# Patient Record
Sex: Male | Born: 1950
Health system: Southern US, Community
[De-identification: ages and names within clinical notes are randomized; demographics above are authoritative.]

## PROBLEM LIST (undated history)

## (undated) DIAGNOSIS — R945 Abnormal results of liver function studies: Secondary | ICD-10-CM

## (undated) DIAGNOSIS — C4491 Basal cell carcinoma of skin, unspecified: Secondary | ICD-10-CM

## (undated) DIAGNOSIS — R7989 Other specified abnormal findings of blood chemistry: Secondary | ICD-10-CM

## (undated) DIAGNOSIS — K219 Gastro-esophageal reflux disease without esophagitis: Secondary | ICD-10-CM

## (undated) DIAGNOSIS — Z8052 Family history of malignant neoplasm of bladder: Secondary | ICD-10-CM

## (undated) DIAGNOSIS — N529 Male erectile dysfunction, unspecified: Secondary | ICD-10-CM

## (undated) DIAGNOSIS — Z8042 Family history of malignant neoplasm of prostate: Secondary | ICD-10-CM

## (undated) DIAGNOSIS — I10 Essential (primary) hypertension: Secondary | ICD-10-CM

## (undated) HISTORY — DX: Family history of malignant neoplasm of prostate: Z80.42

## (undated) HISTORY — DX: Male erectile dysfunction, unspecified: N52.9

## (undated) HISTORY — DX: Family history of malignant neoplasm of bladder: Z80.52

## (undated) HISTORY — DX: Other specified abnormal findings of blood chemistry: R79.89

## (undated) HISTORY — DX: Abnormal results of liver function studies: R94.5

## (undated) HISTORY — DX: Gastro-esophageal reflux disease without esophagitis: K21.9

## (undated) HISTORY — PX: WRIST GANGLION EXCISION: SUR520

## (undated) HISTORY — DX: Basal cell carcinoma of skin, unspecified: C44.91

## (undated) HISTORY — DX: Essential (primary) hypertension: I10

---

## 2002-09-29 ENCOUNTER — Emergency Department (HOSPITAL_COMMUNITY): Admission: EM | Admit: 2002-09-29 | Discharge: 2002-09-29 | Payer: Self-pay | Admitting: Emergency Medicine

## 2002-09-29 ENCOUNTER — Encounter: Payer: Self-pay | Admitting: Emergency Medicine

## 2004-05-30 ENCOUNTER — Ambulatory Visit: Payer: Self-pay | Admitting: Internal Medicine

## 2004-05-31 ENCOUNTER — Ambulatory Visit: Payer: Self-pay | Admitting: Internal Medicine

## 2004-06-08 ENCOUNTER — Ambulatory Visit: Payer: Self-pay | Admitting: Internal Medicine

## 2005-01-05 ENCOUNTER — Ambulatory Visit: Payer: Self-pay | Admitting: Gastroenterology

## 2005-01-10 LAB — HM COLONOSCOPY

## 2005-01-20 ENCOUNTER — Ambulatory Visit: Payer: Self-pay | Admitting: Gastroenterology

## 2005-06-08 ENCOUNTER — Ambulatory Visit: Payer: Self-pay | Admitting: Internal Medicine

## 2006-08-31 ENCOUNTER — Ambulatory Visit: Payer: Self-pay | Admitting: Internal Medicine

## 2006-08-31 LAB — CONVERTED CEMR LAB
ALT: 34 units/L (ref 0–40)
AST: 33 units/L (ref 0–37)
Alkaline Phosphatase: 65 units/L (ref 39–117)
BUN: 14 mg/dL (ref 6–23)
Basophils Relative: 1 % (ref 0.0–1.0)
Bilirubin, Direct: 0.2 mg/dL (ref 0.0–0.3)
CO2: 30 meq/L (ref 19–32)
Calcium: 9.4 mg/dL (ref 8.4–10.5)
Chloride: 106 meq/L (ref 96–112)
Eosinophils Absolute: 0.3 10*3/uL (ref 0.0–0.6)
Eosinophils Relative: 4.7 % (ref 0.0–5.0)
GFR calc non Af Amer: 67 mL/min
Glucose, Bld: 81 mg/dL (ref 70–99)
Platelets: 165 10*3/uL (ref 150–400)
RBC: 4.92 M/uL (ref 4.22–5.81)
Total CHOL/HDL Ratio: 5.4
Triglycerides: 141 mg/dL (ref 0–149)
VLDL: 28 mg/dL (ref 0–40)
WBC: 5.5 10*3/uL (ref 4.5–10.5)

## 2006-09-07 ENCOUNTER — Ambulatory Visit: Payer: Self-pay | Admitting: Internal Medicine

## 2007-05-14 ENCOUNTER — Telehealth: Payer: Self-pay | Admitting: Internal Medicine

## 2007-05-16 ENCOUNTER — Ambulatory Visit: Payer: Self-pay | Admitting: Internal Medicine

## 2007-05-16 DIAGNOSIS — I10 Essential (primary) hypertension: Secondary | ICD-10-CM

## 2007-05-16 DIAGNOSIS — M6740 Ganglion, unspecified site: Secondary | ICD-10-CM | POA: Insufficient documentation

## 2007-11-18 ENCOUNTER — Encounter: Payer: Self-pay | Admitting: Internal Medicine

## 2007-12-31 ENCOUNTER — Telehealth: Payer: Self-pay | Admitting: Internal Medicine

## 2008-01-22 ENCOUNTER — Encounter: Payer: Self-pay | Admitting: Internal Medicine

## 2008-04-09 ENCOUNTER — Ambulatory Visit: Payer: Self-pay | Admitting: Internal Medicine

## 2008-04-14 ENCOUNTER — Telehealth: Payer: Self-pay | Admitting: Internal Medicine

## 2008-04-14 ENCOUNTER — Encounter: Payer: Self-pay | Admitting: Internal Medicine

## 2008-04-27 ENCOUNTER — Telehealth: Payer: Self-pay | Admitting: Internal Medicine

## 2008-05-04 ENCOUNTER — Ambulatory Visit: Payer: Self-pay | Admitting: Internal Medicine

## 2008-05-04 LAB — CONVERTED CEMR LAB
ALT: 37 units/L (ref 0–53)
AST: 42 units/L — ABNORMAL HIGH (ref 0–37)
Basophils Absolute: 0 10*3/uL (ref 0.0–0.1)
Bilirubin Urine: NEGATIVE
Bilirubin, Direct: 0.2 mg/dL (ref 0.0–0.3)
Blood in Urine, dipstick: NEGATIVE
CO2: 28 meq/L (ref 19–32)
Chloride: 100 meq/L (ref 96–112)
Cholesterol: 149 mg/dL (ref 0–200)
Eosinophils Absolute: 0.2 10*3/uL (ref 0.0–0.7)
GFR calc non Af Amer: 67 mL/min
HDL: 34.2 mg/dL — ABNORMAL LOW (ref >39.0)
LDL Cholesterol: 96 mg/dL (ref 0–99)
Lymphocytes Relative: 37.3 % (ref 12.0–46.0)
MCHC: 34.9 g/dL (ref 30.0–36.0)
MCV: 96.1 fL (ref 78.0–100.0)
Neutrophils Relative %: 46.2 % (ref 43.0–77.0)
PSA: 2.35 ng/mL (ref 0.10–4.00)
Platelets: 163 10*3/uL (ref 150–400)
RBC: 4.46 M/uL (ref 4.22–5.81)
RDW: 12.7 % (ref 11.5–14.6)
Sodium: 135 meq/L (ref 135–145)
TSH: 1.11 microintl units/mL (ref 0.35–5.50)
Total Bilirubin: 1.1 mg/dL (ref 0.3–1.2)
Triglycerides: 95 mg/dL (ref 0–149)
Urobilinogen, UA: 0.2
VLDL: 19 mg/dL (ref 0–40)
pH: 7

## 2008-05-07 ENCOUNTER — Ambulatory Visit: Payer: Self-pay | Admitting: Internal Medicine

## 2008-05-07 DIAGNOSIS — F528 Other sexual dysfunction not due to a substance or known physiological condition: Secondary | ICD-10-CM

## 2008-05-25 ENCOUNTER — Ambulatory Visit: Payer: Self-pay | Admitting: Internal Medicine

## 2009-05-21 ENCOUNTER — Ambulatory Visit: Payer: Self-pay | Admitting: Internal Medicine

## 2009-05-21 DIAGNOSIS — J069 Acute upper respiratory infection, unspecified: Secondary | ICD-10-CM | POA: Insufficient documentation

## 2009-07-15 ENCOUNTER — Ambulatory Visit: Payer: Self-pay | Admitting: Internal Medicine

## 2009-07-15 LAB — CONVERTED CEMR LAB
ALT: 36 units/L (ref 0–53)
Albumin: 4.6 g/dL (ref 3.5–5.2)
BUN: 12 mg/dL (ref 6–23)
Basophils Relative: 0.8 % (ref 0.0–3.0)
Bilirubin Urine: NEGATIVE
CO2: 28 meq/L (ref 19–32)
Chloride: 100 meq/L (ref 96–112)
Creatinine, Ser: 1.3 mg/dL (ref 0.4–1.5)
Eosinophils Relative: 2.8 % (ref 0.0–5.0)
HCT: 44.4 % (ref 39.0–52.0)
HDL: 40.9 mg/dL (ref >39.00)
Ketones, ur: NEGATIVE mg/dL
Leukocytes, UA: NEGATIVE
Lymphs Abs: 1.9 10*3/uL (ref 0.7–4.0)
MCV: 96.2 fL (ref 78.0–100.0)
Monocytes Absolute: 0.6 10*3/uL (ref 0.1–1.0)
Neutro Abs: 2.6 10*3/uL (ref 1.4–7.7)
PSA: 3.09 ng/mL (ref 0.10–4.00)
Platelets: 162 10*3/uL (ref 150.0–400.0)
Potassium: 4.4 meq/L (ref 3.5–5.1)
RBC: 4.62 M/uL (ref 4.22–5.81)
TSH: 1.48 microintl units/mL (ref 0.35–5.50)
Total Protein: 7.6 g/dL (ref 6.0–8.3)
Urobilinogen, UA: 0.2 (ref 0.0–1.0)
WBC: 5.2 10*3/uL (ref 4.5–10.5)

## 2009-07-22 ENCOUNTER — Ambulatory Visit: Payer: Self-pay | Admitting: Internal Medicine

## 2009-12-10 ENCOUNTER — Encounter (INDEPENDENT_AMBULATORY_CARE_PROVIDER_SITE_OTHER): Payer: Self-pay | Admitting: *Deleted

## 2010-03-14 ENCOUNTER — Telehealth: Payer: Self-pay | Admitting: Internal Medicine

## 2010-05-31 NOTE — Assessment & Plan Note (Signed)
Summary: CONGESTION, URI? // RS   Vital Signs:  Patient profile:   60 year old male Weight:      246 pounds Temp:     99.1 degrees F oral BP sitting:   148 / 96  (left arm) Cuff size:   regular  Vitals Entered By: Raechel Ache, RN (May 21, 2009 10:59 AM) CC: C/o headache, runny nose, sneezing and cough all week.   CC:  C/o headache, runny nose, and sneezing and cough all week.Marland Kitchen  History of Present Illness: 60 year old patient, who presents with a 4 to 5 day history of head and chest congestion and nonproductive cough.  He has been using OTC medications with only marginal benefit.  He has a history of treated hypertension.  His main symptoms cough.  He denies any fever, chills, chest pain, shortness of breath, or purulent sputum production  Allergies: No Known Drug Allergies  Past History:  Past Medical History: Reviewed history from 05/16/2007 and no changes required. Hypertension ED history of elevated   Liver function studies (hepatic steatosis) history retinal vein occlusion of all the right eye  Review of Systems       The patient complains of anorexia, hoarseness, and prolonged cough.  The patient denies fever, weight loss, weight gain, vision loss, decreased hearing, chest pain, syncope, dyspnea on exertion, peripheral edema, headaches, hemoptysis, abdominal pain, melena, hematochezia, severe indigestion/heartburn, hematuria, incontinence, genital sores, muscle weakness, suspicious skin lesions, transient blindness, difficulty walking, depression, unusual weight change, abnormal bleeding, enlarged lymph nodes, angioedema, breast masses, and testicular masses.    Physical Exam  General:  overweight-appearing.  150/90 Head:  Normocephalic and atraumatic without obvious abnormalities. No apparent alopecia or balding. Eyes:  No corneal or conjunctival inflammation noted. EOMI. Perrla. Funduscopic exam benign, without hemorrhages, exudates or papilledema. Vision grossly  normal. Ears:  External ear exam shows no significant lesions or deformities.  Otoscopic examination reveals clear canals, tympanic membranes are intact bilaterally without bulging, retraction, inflammation or discharge. Hearing is grossly normal bilaterally. Mouth:  pharyngeal erythema.   Neck:  No deformities, masses, or tenderness noted. Lungs:  Normal respiratory effort, chest expands symmetrically. Lungs are clear to auscultation, no crackles or wheezes. Heart:  Normal rate and regular rhythm. S1 and S2 normal without gallop, murmur, click, rub or other extra sounds.   Impression & Recommendations:  Problem # 1:  URI (ICD-465.9)  His updated medication list for this problem includes:    Aspirin 81 Mg Tbec (Aspirin) .Marland Kitchen... Take 1 tablet by mouth every morning    Hydrocodone-homatropine 5-1.5 Mg/60ml Syrp (Hydrocodone-homatropine) .Marland Kitchen... 1 teaspoon every 6 hours as needed for cough  Problem # 2:  HYPERTENSION (ICD-401.9)  His updated medication list for this problem includes:    Lisinopril 20 Mg Tabs (Lisinopril) ..... One daily  Complete Medication List: 1)  Aspirin 81 Mg Tbec (Aspirin) .... Take 1 tablet by mouth every morning 2)  Xalatan 0.005 % Soln (Latanoprost) .... Instill 1 drop into both eyes every night 3)  Zolpidem Tartrate 10 Mg Tabs (Zolpidem tartrate) .... One at bedtime as needed 4)  Trazodone Hcl 100 Mg Tabs (Trazodone hcl) .Marland Kitchen.. 1 at bedtime for sleep 5)  Viagra 100 Mg Tabs (Sildenafil citrate) .... Uad 6)  Lisinopril 20 Mg Tabs (Lisinopril) .... One daily 7)  Hydrocodone-homatropine 5-1.5 Mg/14ml Syrp (Hydrocodone-homatropine) .Marland Kitchen.. 1 teaspoon every 6 hours as needed for cough  Patient Instructions: 1)  Get plenty of rest, drink lots of clear liquids, and use Tylenol or  Ibuprofen for fever and comfort. Return in 7-10 days if you're not better:sooner if you're feeling worse. 2)  Limit your Sodium (Salt) to less than 2 grams a day(slightly less than 1/2 a teaspoon) to  prevent fluid retention, swelling, or worsening of symptoms. 3)  It is important that you exercise regularly at least 20 minutes 5 times a week. If you develop chest pain, have severe difficulty breathing, or feel very tired , stop exercising immediately and seek medical attention. 4)  Check your Blood Pressure regularly. If it is above: 140/90  you should make an appointment. 5)  Annual exam 4-6 months Prescriptions: HYDROCODONE-HOMATROPINE 5-1.5 MG/5ML SYRP (HYDROCODONE-HOMATROPINE) 1 teaspoon every 6 hours as needed for cough  #6 oz x 1   Entered and Authorized by:   Gordy Savers  MD   Signed by:   Gordy Savers  MD on 05/21/2009   Method used:   Print then Give to Patient   RxID:   [id number] LISINOPRIL 20 MG TABS (LISINOPRIL) one daily  #90 x 5   Entered and Authorized by:   Gordy Savers  MD   Signed by:   Gordy Savers  MD on 05/21/2009   Method used:   Print then Give to Patient   RxID:   [id number] VIAGRA 100 MG TABS (SILDENAFIL CITRATE) UAD  #12 x 12   Entered and Authorized by:   Gordy Savers  MD   Signed by:   Gordy Savers  MD on 05/21/2009   Method used:   Print then Give to Patient   RxID:   [id number] TRAZODONE HCL 100 MG TABS (TRAZODONE HCL) 1 at bedtime for sleep  #50 x 6   Entered and Authorized by:   Gordy Savers  MD   Signed by:   Gordy Savers  MD on 05/21/2009   Method used:   Print then Give to Patient   RxID:   [id number] ZOLPIDEM TARTRATE 10 MG  TABS (ZOLPIDEM TARTRATE) one at bedtime as needed  #50 x 3   Entered and Authorized by:   Gordy Savers  MD   Signed by:   Gordy Savers  MD on 05/21/2009   Method used:   Print then Give to Patient   RxID:   [id number]

## 2010-05-31 NOTE — Progress Notes (Signed)
Summary: REQ FOR DOSAGE CHANGE  Phone Note Call from Patient   Caller: Patient   [phone] Summary of Call: Pt called to state that he would like to have his Rx for Cialis changed to a daily dosage instead as needed dosage????   [facility], [place].  Initial call taken by: Debbra Riding,  March 14, 2010 11:37 AM  Follow-up for Phone Call        cialis 5 mg  #30 one daily  RF 6 Follow-up by: Gordy Savers  MD,  March 14, 2010 12:31 PM  Additional Follow-up for Phone Call Additional follow up Details #1::        change to med list , faxed new rx - pt aware. KIK Additional Follow-up by: Duard Brady LPN,  March 14, 2010 2:22 PM    New/Updated Medications: CIALIS 5 MG TABS (TADALAFIL) qd Prescriptions: CIALIS 5 MG TABS (TADALAFIL) qd  #30 x 6   Entered by:   Duard Brady LPN   Authorized by:   Gordy Savers  MD   Signed by:   Duard Brady LPN on 78/29/5621   Method used:   Faxed to ...       [facility]. [phone]* (retail)       [phone] [address].       [place], [place]  [address]       Ph: [phone]       Fax: [phone]   RxID:   [id number]

## 2010-05-31 NOTE — Assessment & Plan Note (Signed)
Summary: CPX/NJR   Vital Signs:  Patient profile:   60 year old male Height:      70.75 inches Weight:      237 pounds BMI:     33.41 Temp:     98.6 degrees F oral BP sitting:   112 / 74  (right arm) Cuff size:   large CC: cpx - doing well . labs done Is Patient Diabetic? No   CC:  cpx - doing well . labs done.  History of Present Illness: 60 year old gentleman, who is seen today for a wellness exam.  He has a history of hypertension, mild ED, and has done quite well.  No concerns or complaints today.  He does have a brother who has coronary artery disease.  He was a heavy smoker.  He is up-to-date on his colonoscopies  Preventive Screening-Counseling & Management  Alcohol-Tobacco     Smoking Status: never  Allergies (verified): No Known Drug Allergies  Past History:  Past Medical History: Reviewed history from 05/16/2007 and no changes required. Hypertension ED history of elevated   Liver function studies (hepatic steatosis) history retinal vein occlusion of all the right eye  Past Surgical History: Reviewed history from 05/16/2007 and no changes required. colonoscopy September 2006  Family History: Reviewed history from 05/16/2007 and no changes required. father died age 74 MRI mother died age 64.  History hypertension  Four brothers one brother, coronary artery disease, status post stenting positive for  hypertension one brother died of accidental  death  No sisters  Social History: Reviewed history from 05/16/2007 and no changes required. 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    Physical Exam  General:  overweight-appearing.  low-normal blood pressureoverweight-appearing.   Head:  Normocephalic and atraumatic without obvious abnormalities. No apparent alopecia or balding. Eyes:  No corneal or conjunctival inflammation noted. EOMI. Perrla. Funduscopic exam benign, without hemorrhages, exudates or papilledema. Vision grossly normal. Ears:  External ear exam shows no significant lesions or deformities.  Otoscopic examination reveals clear canals, tympanic membranes are intact bilaterally without bulging, retraction, inflammation or discharge. Hearing is grossly normal bilaterally. Mouth:  Oral mucosa and oropharynx without lesions or exudates.  Teeth in good repair. Neck:  No deformities, masses, or tenderness noted. Chest Wall:  No deformities, masses, tenderness or gynecomastia noted. Breasts:  No masses or gynecomastia noted Lungs:  Normal respiratory effort, chest expands symmetrically. Lungs are clear to auscultation, no crackles or wheezes. Heart:  Normal rate and regular rhythm. S1 and S2 normal without gallop, murmur, click, rub or other extra sounds. Abdomen:  Bowel sounds positive,abdomen soft and non-tender without masses, organomegaly or hernias noted. Rectal:  No external abnormalities noted. Normal sphincter tone. No rectal masses or tenderness. Genitalia:  Testes bilaterally descended without nodularity, tenderness or masses. No scrotal masses or lesions. No penis lesions or urethral discharge. Prostate:  2+ enlarged.  2+ enlarged.   Msk:  No deformity or scoliosis noted of thoracic or lumbar spine.   Pulses:  R and L carotid,radial,femoral,dorsalis pedis and posterior tibial pulses are full and equal bilaterally Extremities:  No clubbing, cyanosis, edema, or  deformity noted with  normal full range of motion of all joints.   Neurologic:  No cranial nerve deficits noted. Station and gait are normal. Plantar reflexes are down-going bilaterally. DTRs are symmetrical throughout. Sensory, motor and coordinative functions appear intact. Skin:  Intact without suspicious lesions or rashes Cervical Nodes:  No lymphadenopathy noted Axillary Nodes:  No palpable lymphadenopathy Inguinal Nodes:  No significant adenopathy Psych:  Cognition and judgment appear intact. Alert and cooperative with normal attention span and concentration. No apparent delusions, illusions, hallucinations   Impression & Recommendations:  Problem # 1:  Preventive Health Care (ICD-V70.0)  Complete Medication List: 1)  Aspirin 81 Mg Tbec (Aspirin) .... Take 1 tablet by mouth every morning 2)  Xalatan 0.005 % Soln (Latanoprost) .... Instill 1 drop into both eyes every night 3)  Zolpidem Tartrate 10 Mg Tabs (Zolpidem tartrate) .... One at bedtime as needed 4)  Trazodone Hcl 100 Mg Tabs (Trazodone hcl) .Marland Kitchen.. 1 at bedtime for sleep 5)  Lisinopril 20 Mg Tabs (Lisinopril) .... One daily 6)  Hydrocodone-homatropine 5-1.5 Mg/50ml Syrp (Hydrocodone-homatropine) .Marland Kitchen.. 1 teaspoon every 6 hours as needed for cough 7)  Cialis 20 Mg Tabs (Tadalafil) .... One daily, as needed  Patient Instructions: 1)  Please schedule a follow-up appointment in 1 year. 2)  Limit your Sodium (Salt). 3)  It is important that you exercise regularly at least 20 minutes 5 times a week. If you develop chest pain, have severe difficulty breathing, or feel very tired , stop exercising immediately and seek medical attention. 4)  You need to lose weight. Consider a lower calorie diet and regular exercise.  5)  Check your Blood Pressure regularly. If it is above: 150/90 you should make an appointment. Prescriptions: CIALIS 20 MG TABS (TADALAFIL) one daily, as needed  #3 x 12   Entered and Authorized by:   Gordy Savers  MD   Signed by:    Gordy Savers  MD on 07/22/2009   Method used:   Print then Give to Patient   RxID:   [id number] CIALIS 20 MG TABS (TADALAFIL) one daily, as needed  #12 x 12   Entered and Authorized by:   Gordy Savers  MD   Signed by:   Gordy Savers  MD on 07/22/2009   Method used:   Print then Give to Patient   RxID:   [id number] LISINOPRIL 20 MG TABS (LISINOPRIL) one daily  #90 x 5   Entered and Authorized by:   Gordy Savers  MD   Signed by:   Gordy Savers  MD on 07/22/2009   Method used:   Print then Give to Patient   RxID:   [id number] TRAZODONE HCL 100 MG TABS (TRAZODONE HCL) 1 at bedtime for sleep  #50 x 6   Entered and Authorized by:   Gordy Savers  MD   Signed by:   Gordy Savers  MD on 07/22/2009   Method used:   Print then Give to Patient   RxID:   [id number] ZOLPIDEM TARTRATE 10 MG  TABS (ZOLPIDEM TARTRATE) one at bedtime as needed  #50 x 3   Entered and Authorized by:   Gordy Savers  MD   Signed by:   Gordy Savers  MD on 07/22/2009   Method used:   Print then Give to Patient   RxID:   [id number]

## 2010-05-31 NOTE — Letter (Signed)
Summary: Colonoscopy Letter  [facility]  [address] [place], [place] [address]   Phone: [phone]  Fax: [phone]      December 10, 2009 MRN: [id number]   [facility] [address] [place], [place]  [address]   Dear Dillon [name],   According to your medical record, it is time for you to schedule a Colonoscopy. The American Cancer Society recommends this procedure as a method to detect early colon cancer. Patients with a family history of colon cancer, or a personal history of colon polyps or inflammatory bowel disease are at increased risk.  This letter has beeen generated based on the recommendations made at the time of your procedure. If you feel that in your particular situation this may no longer apply, please contact our office.  Please call our office at [phone] to schedule this appointment or to update your records at your earliest convenience.  Thank you for cooperating with [place] to provide you with the very best care possible.   Sincerely,  Judie Petit T. Russella Dar, M.D.  [facility] [phone]

## 2010-06-03 ENCOUNTER — Telehealth: Payer: Self-pay | Admitting: Internal Medicine

## 2010-06-03 NOTE — Telephone Encounter (Signed)
Pt called and need a script written for Viagra. Pt says that Cialis  Isn't working properly. Pls call on Monday 06/06/10.

## 2010-06-03 NOTE — Telephone Encounter (Signed)
Please advise 

## 2010-06-06 MED ORDER — SILDENAFIL CITRATE 100 MG PO TABS: 100.0000 mg | ORAL_TABLET | ORAL | Status: DC | PRN

## 2010-06-06 NOTE — Telephone Encounter (Signed)
ok 

## 2010-06-06 NOTE — Telephone Encounter (Signed)
Attempt to call - ans mach - LMTCB if questions - rx ready for pick up. KIK

## 2010-07-18 ENCOUNTER — Encounter (INDEPENDENT_AMBULATORY_CARE_PROVIDER_SITE_OTHER): Payer: Self-pay | Admitting: *Deleted

## 2010-07-18 ENCOUNTER — Other Ambulatory Visit: Payer: BC Managed Care – PPO

## 2010-07-18 ENCOUNTER — Other Ambulatory Visit: Payer: Self-pay | Admitting: Internal Medicine

## 2010-07-18 DIAGNOSIS — Z0000 Encounter for general adult medical examination without abnormal findings: Secondary | ICD-10-CM

## 2010-07-18 LAB — CBC WITH DIFFERENTIAL/PLATELET
Basophils Absolute: 0 10*3/uL (ref 0.0–0.1)
Eosinophils Absolute: 0.2 10*3/uL (ref 0.0–0.7)
Hemoglobin: 14.5 g/dL (ref 13.0–17.0)
Lymphocytes Relative: 41.2 % (ref 12.0–46.0)
MCHC: 34.5 g/dL (ref 30.0–36.0)
MCV: 92.9 fl (ref 78.0–100.0)
Monocytes Absolute: 0.6 10*3/uL (ref 0.1–1.0)
Neutro Abs: 2.1 10*3/uL (ref 1.4–7.7)
Neutrophils Relative %: 42.4 % — ABNORMAL LOW (ref 43.0–77.0)
RDW: 13.5 % (ref 11.5–14.6)

## 2010-07-18 LAB — HEPATIC FUNCTION PANEL
ALT: 26 U/L (ref 0–53)
AST: 27 U/L (ref 0–37)
Albumin: 4.2 g/dL (ref 3.5–5.2)
Alkaline Phosphatase: 58 U/L (ref 39–117)
Bilirubin, Direct: 0.2 mg/dL (ref 0.0–0.3)
Total Protein: 7 g/dL (ref 6.0–8.3)

## 2010-07-18 LAB — LIPID PANEL: HDL: 31.5 mg/dL — ABNORMAL LOW (ref >39.00)

## 2010-07-18 LAB — BASIC METABOLIC PANEL
CO2: 27 mEq/L (ref 19–32)
Calcium: 9.3 mg/dL (ref 8.4–10.5)
Chloride: 103 mEq/L (ref 96–112)
Glucose, Bld: 99 mg/dL (ref 70–99)
Sodium: 136 mEq/L (ref 135–145)

## 2010-07-18 LAB — TSH: TSH: 1.92 u[IU]/mL (ref 0.35–5.50)

## 2010-07-18 LAB — URINALYSIS
Ketones, ur: NEGATIVE
Leukocytes, UA: NEGATIVE
Nitrite: NEGATIVE
Specific Gravity, Urine: 1.025 (ref 1.000–1.030)
Urobilinogen, UA: 0.2 (ref 0.0–1.0)

## 2010-07-22 ENCOUNTER — Encounter: Payer: Self-pay | Admitting: Internal Medicine

## 2010-07-25 ENCOUNTER — Encounter: Payer: Self-pay | Admitting: Internal Medicine

## 2010-07-25 ENCOUNTER — Ambulatory Visit (INDEPENDENT_AMBULATORY_CARE_PROVIDER_SITE_OTHER): Payer: BC Managed Care – PPO | Admitting: Internal Medicine

## 2010-07-25 VITALS — BP 110/80 | HR 62 | Temp 99.1°F | Resp 16 | Ht 70.75 in | Wt 250.0 lb

## 2010-07-25 DIAGNOSIS — Z0000 Encounter for general adult medical examination without abnormal findings: Secondary | ICD-10-CM

## 2010-07-25 MED ORDER — LISINOPRIL 20 MG PO TABS: 20.0000 mg | ORAL_TABLET | Freq: Every day | ORAL | Status: DC

## 2010-07-25 MED ORDER — SILDENAFIL CITRATE 100 MG PO TABS: 100.0000 mg | ORAL_TABLET | ORAL | Status: DC | PRN

## 2010-07-25 MED ORDER — ZOLPIDEM TARTRATE 10 MG PO TABS: 10.0000 mg | ORAL_TABLET | Freq: Every evening | ORAL | Status: DC | PRN

## 2010-07-25 MED ORDER — TRAZODONE HCL 100 MG PO TABS: 100.0000 mg | ORAL_TABLET | Freq: Every day | ORAL | Status: DC

## 2010-07-25 NOTE — Progress Notes (Signed)
  Subjective:    Patient ID: Dillon [name], male    DOB: 05-30-1950, 60 y.o.   MRN: [id number]  HPI   Wt Readings from Last 3 Encounters:  07/25/10 250 lb (113.399 kg)  07/22/09 237 lb (107.502 kg)  05/21/09 246 lb (111.40 kg)    60 year old patient who is seen today for an annual examination. He has a history of treated hypertension which has been stable. He does monitor home blood pressure readings. He has a history of increasing PSA but today level is back down to approximately 1.0. He feels well. Denies any concerns or complaints.   Review of Systems  Constitutional: Negative for fever, chills, activity change, appetite change and fatigue.  HENT: Negative for hearing loss, ear pain, congestion, rhinorrhea, sneezing, mouth sores, trouble swallowing, neck pain, neck stiffness, dental problem, voice change, sinus pressure and tinnitus.   Eyes: Negative for photophobia, pain, redness and visual disturbance.  Respiratory: Negative for apnea, cough, choking, chest tightness, shortness of breath and wheezing.   Cardiovascular: Negative for chest pain, palpitations and leg swelling.  Gastrointestinal: Negative for nausea, vomiting, abdominal pain, diarrhea, constipation, blood in stool, abdominal distention, anal bleeding and rectal pain.  Genitourinary: Negative for dysuria, urgency, frequency, hematuria, flank pain, decreased urine volume, discharge, penile swelling, scrotal swelling, difficulty urinating, genital sores and testicular pain.  Musculoskeletal: Negative for myalgias, back pain, joint swelling, arthralgias and gait problem.  Skin: Negative for color change, rash and wound.  Neurological: Negative for dizziness, tremors, seizures, syncope, facial asymmetry, speech difficulty, weakness, light-headedness, numbness and headaches.  Hematological: Negative for adenopathy. Does not bruise/bleed easily.  Psychiatric/Behavioral: Negative for suicidal ideas, hallucinations, behavioral  problems, confusion, sleep disturbance, self-injury, dysphoric mood, decreased concentration and agitation. The patient is not nervous/anxious.        Objective:   Physical Exam  Constitutional: He appears well-developed and well-nourished.  HENT:  Head: Normocephalic and atraumatic.  Right Ear: External ear normal.  Left Ear: External ear normal.  Nose: Nose normal.  Mouth/Throat: Oropharynx is clear and moist.  Eyes: Conjunctivae and EOM are normal. Pupils are equal, round, and reactive to light. No scleral icterus.  Neck: Normal range of motion. Neck supple. No JVD present. No thyromegaly present.  Cardiovascular: Regular rhythm, normal heart sounds and intact distal pulses.  Exam reveals no gallop and no friction rub.   No murmur heard. Pulmonary/Chest: Effort normal and breath sounds normal. He exhibits no tenderness.  Abdominal: Soft. Bowel sounds are normal. He exhibits no distension and no mass. There is no tenderness.  Genitourinary: Prostate normal and penis normal.  Musculoskeletal: Normal range of motion. He exhibits no edema and no tenderness.  Lymphadenopathy:    He has no cervical adenopathy.  Neurological: He is alert. He has normal reflexes. No cranial nerve deficit. Coordination normal.  Skin: Skin is warm and dry. No rash noted.  Psychiatric: He has a normal mood and affect. His behavior is normal.          Assessment & Plan:   unremarkable clinical exam except for mild exogenous obesity  Mild dyslipidemia with a low HDL cholesterol. Lifestyle issues encouraged. He will attend more exercise and modest weight loss. There's been a 13 pound weight gain over the past year

## 2010-07-25 NOTE — Patient Instructions (Signed)
Limit your sodium (Salt) intake  Please check your blood pressure on a regular basis.  If it is consistently greater than 150/90, please make an office appointment.  Return in one year for follow-up  

## 2010-09-13 NOTE — Assessment & Plan Note (Signed)
[facility] OFFICE NOTE   Dillon [name], Dillon [name]                       MRN:          [id number]  DATE:09/07/2006                            DOB:          01/05/1951    This is a 60 year old gentleman seen today for an annual exam. He does  quite well. He has a history of hypertension, ED, remote history of a  right retinal vein occlusion. He is on Lisinopril hydrochlorothiazide,  daily aspirin, and eye drops.   REVIEW OF SYSTEMS:  Negative. He is doing quite well. He did have  screening colonoscopy in 2006.   SOCIAL HISTORY:  He is divorced and works as a Scientist, research (physical sciences). He  has three children.   FAMILY HISTORY:  Unchanged. Positive for coronary artery disease and  hypertension.   PHYSICAL EXAMINATION:  GENERAL:  Revealed a mildly overweight male in no  acute distress.  VITAL SIGNS:  Blood pressure was well controlled.  HEENT:  Clear.  NECK:  No adenopathy or bruits.  CHEST:  Clear.  CARDIOVASCULAR:  Normal heart sounds. No murmurs.  ABDOMEN:  Obese, soft and nontender, no organomegaly.  EXTERNAL GENITALIA:  Normal.  EXTREMITIES:  Revealed full peripheral pulses and no edema.  RECTAL:  Prostate +1 and benign. Stool was heme negative.   IMPRESSION:  Hypertension, history of diverticulosis, history of hepatic  steatosis with elevated transaminases now normalized, history of ED.   DISPOSITION:  Continued efforts at weight loss with more regular  exercise encouraged. Return in one year for follow up. Medical regimen  unchanged.     Gordy Savers, MD  Electronically Signed    PFK/MedQ  DD: 09/07/2006  DT: 09/08/2006  Job #: [id number]

## 2011-02-12 DIAGNOSIS — H409 Unspecified glaucoma: Secondary | ICD-10-CM | POA: Insufficient documentation

## 2011-02-12 DIAGNOSIS — H4011X0 Primary open-angle glaucoma, stage unspecified: Secondary | ICD-10-CM | POA: Insufficient documentation

## 2011-02-12 DIAGNOSIS — H401190 Primary open-angle glaucoma, unspecified eye, stage unspecified: Secondary | ICD-10-CM | POA: Insufficient documentation

## 2011-02-12 DIAGNOSIS — H348392 Tributary (branch) retinal vein occlusion, unspecified eye, stable: Secondary | ICD-10-CM | POA: Insufficient documentation

## 2011-02-15 ENCOUNTER — Other Ambulatory Visit: Payer: Self-pay | Admitting: Internal Medicine

## 2011-02-17 ENCOUNTER — Other Ambulatory Visit: Payer: Self-pay

## 2011-02-17 MED ORDER — ZOLPIDEM TARTRATE 10 MG PO TABS: 10.0000 mg | ORAL_TABLET | Freq: Every evening | ORAL | Status: DC | PRN

## 2011-09-05 ENCOUNTER — Other Ambulatory Visit: Payer: Self-pay | Admitting: Internal Medicine

## 2011-09-08 ENCOUNTER — Other Ambulatory Visit (INDEPENDENT_AMBULATORY_CARE_PROVIDER_SITE_OTHER): Payer: BC Managed Care – PPO

## 2011-09-08 DIAGNOSIS — Z0000 Encounter for general adult medical examination without abnormal findings: Secondary | ICD-10-CM

## 2011-09-08 LAB — BASIC METABOLIC PANEL
CO2: 26 mEq/L (ref 19–32)
Chloride: 104 mEq/L (ref 96–112)
Potassium: 5.4 mEq/L — ABNORMAL HIGH (ref 3.5–5.1)
Sodium: 138 mEq/L (ref 135–145)

## 2011-09-08 LAB — CBC WITH DIFFERENTIAL/PLATELET
Basophils Relative: 0.9 % (ref 0.0–3.0)
Eosinophils Absolute: 0.1 10*3/uL (ref 0.0–0.7)
Hemoglobin: 13.1 g/dL (ref 13.0–17.0)
Lymphocytes Relative: 42.9 % (ref 12.0–46.0)
MCHC: 33.1 g/dL (ref 30.0–36.0)
MCV: 91.1 fl (ref 78.0–100.0)
Monocytes Absolute: 0.6 10*3/uL (ref 0.1–1.0)
Neutro Abs: 2 10*3/uL (ref 1.4–7.7)
RBC: 4.34 Mil/uL (ref 4.22–5.81)

## 2011-09-08 LAB — LIPID PANEL
Cholesterol: 167 mg/dL (ref 0–200)
HDL: 34.1 mg/dL — ABNORMAL LOW (ref >39.00)
VLDL: 30.2 mg/dL (ref 0.0–40.0)

## 2011-09-08 LAB — POCT URINALYSIS DIPSTICK
Bilirubin, UA: NEGATIVE
Ketones, UA: NEGATIVE
Leukocytes, UA: NEGATIVE

## 2011-09-08 LAB — HEPATIC FUNCTION PANEL
Albumin: 4 g/dL (ref 3.5–5.2)
Alkaline Phosphatase: 56 U/L (ref 39–117)
Total Protein: 7 g/dL (ref 6.0–8.3)

## 2011-09-09 ENCOUNTER — Other Ambulatory Visit: Payer: Self-pay | Admitting: Internal Medicine

## 2011-09-15 ENCOUNTER — Encounter: Payer: BC Managed Care – PPO | Admitting: Internal Medicine

## 2011-09-21 ENCOUNTER — Encounter: Payer: Self-pay | Admitting: Internal Medicine

## 2011-09-21 ENCOUNTER — Ambulatory Visit (INDEPENDENT_AMBULATORY_CARE_PROVIDER_SITE_OTHER): Payer: BC Managed Care – PPO | Admitting: Internal Medicine

## 2011-09-21 VITALS — BP 120/76 | HR 92 | Temp 98.3°F | Ht 71.0 in | Wt 256.0 lb

## 2011-09-21 DIAGNOSIS — Z0000 Encounter for general adult medical examination without abnormal findings: Secondary | ICD-10-CM

## 2011-09-21 MED ORDER — TRAZODONE HCL 100 MG PO TABS: 100.0000 mg | ORAL_TABLET | Freq: Every day | ORAL | Status: DC

## 2011-09-21 MED ORDER — SILDENAFIL CITRATE 100 MG PO TABS: 100.0000 mg | ORAL_TABLET | ORAL | Status: DC | PRN

## 2011-09-21 MED ORDER — ZOLPIDEM TARTRATE 10 MG PO TABS: 10.0000 mg | ORAL_TABLET | Freq: Every evening | ORAL | Status: DC | PRN

## 2011-09-21 MED ORDER — LISINOPRIL 20 MG PO TABS: 20.0000 mg | ORAL_TABLET | Freq: Every day | ORAL | Status: DC

## 2011-09-21 NOTE — Progress Notes (Signed)
  Subjective:    Patient ID: Dillon [name], male    DOB: October 24, 1950, 61 y.o.   MRN: [id number]  HPI  Wt Readings from Last 3 Encounters:  09/21/11 256 lb (116.121 kg)  07/25/10 250 lb (113.399 kg)  07/22/09 237 lb (107.502 kg)    Review of Systems     Objective:   Physical Exam        Assessment & Plan:

## 2011-09-21 NOTE — Patient Instructions (Signed)

## 2011-09-22 ENCOUNTER — Encounter: Payer: Self-pay | Admitting: Internal Medicine

## 2011-10-24 ENCOUNTER — Other Ambulatory Visit: Payer: Self-pay | Admitting: Internal Medicine

## 2011-12-25 ENCOUNTER — Other Ambulatory Visit: Payer: Self-pay

## 2011-12-25 MED ORDER — ZOLPIDEM TARTRATE 10 MG PO TABS: 10.0000 mg | ORAL_TABLET | Freq: Every evening | ORAL | Status: DC | PRN

## 2012-01-03 ENCOUNTER — Encounter: Payer: Self-pay | Admitting: Gastroenterology

## 2012-09-09 ENCOUNTER — Other Ambulatory Visit: Payer: Self-pay | Admitting: *Deleted

## 2012-09-09 MED ORDER — ZOLPIDEM TARTRATE 10 MG PO TABS: 10.0000 mg | ORAL_TABLET | Freq: Every evening | ORAL | Status: DC | PRN

## 2012-10-18 ENCOUNTER — Encounter: Payer: Self-pay | Admitting: Gastroenterology

## 2012-10-21 ENCOUNTER — Ambulatory Visit (INDEPENDENT_AMBULATORY_CARE_PROVIDER_SITE_OTHER): Payer: BC Managed Care – PPO | Admitting: Internal Medicine

## 2012-10-21 ENCOUNTER — Ambulatory Visit: Payer: BC Managed Care – PPO | Admitting: Internal Medicine

## 2012-10-21 ENCOUNTER — Encounter: Payer: Self-pay | Admitting: Internal Medicine

## 2012-10-21 VITALS — BP 126/80 | HR 90 | Temp 98.7°F | Resp 20 | Wt 259.0 lb

## 2012-10-21 DIAGNOSIS — J069 Acute upper respiratory infection, unspecified: Secondary | ICD-10-CM

## 2012-10-21 DIAGNOSIS — I10 Essential (primary) hypertension: Secondary | ICD-10-CM

## 2012-10-21 MED ORDER — HYDROCODONE-HOMATROPINE 5-1.5 MG/5ML PO SYRP: 5.0000 mL | ORAL_SOLUTION | Freq: Four times a day (QID) | ORAL | Status: DC | PRN

## 2012-10-21 NOTE — Progress Notes (Signed)
Subjective:    Patient ID: Dillon [name], male    DOB: 1950-06-30, 62 y.o.   MRN: [id number]  HPI   62 year old patient who has treated hypertension. He has been on lisinopril 20 mg daily. For the past several days he has had some cough associated with mild congestion. Denies any sore throat or fever. Cough is nonproductive   Past Medical History  Diagnosis Date  . Hypertension   . ED (erectile dysfunction)   . Elevated liver function tests     History   Social History  . Marital Status: Married    Spouse Name: N/A    Number of Children: N/A  . Years of Education: N/A   Occupational History  . Not on file.   Social History Main Topics  . Smoking status: Former Smoker    Quit date: 05/02/1991  . Smokeless tobacco: Never Used  . Alcohol Use: 3.5 oz/week    7 drink(s) per week  . Drug Use: No  . Sexually Active: Not on file   Other Topics Concern  . Not on file   Social History Narrative  . No narrative on file    History reviewed. No pertinent past surgical history.  Family History  Problem Relation Age of Onset  . Coronary artery disease Brother   . Hyperlipidemia Brother     No Known Allergies  Current Outpatient Prescriptions on File Prior to Visit  Medication Sig Dispense Refill  . aspirin 81 MG tablet Take 81 mg by mouth daily.        Marland Kitchen latanoprost (XALATAN) 0.005 % ophthalmic solution Place 1 drop into both eyes at bedtime.        Marland Kitchen lisinopril (PRINIVIL,ZESTRIL) 20 MG tablet Take 1 tablet (20 mg total) by mouth daily.  90 tablet  6  . sildenafil (VIAGRA) 100 MG tablet Take 1 tablet (100 mg total) by mouth as needed for erectile dysfunction.  6 tablet  6  . traZODone (DESYREL) 100 MG tablet Take 1 tablet (100 mg total) by mouth at bedtime.  90 tablet  4  . zolpidem (AMBIEN) 10 MG tablet Take 1 tablet (10 mg total) by mouth at bedtime as needed.  30 tablet  0   No current facility-administered medications on file prior to visit.    BP 126/80  Pulse 90   Temp(Src) 98.7 F (37.1 C) (Oral)  Resp 20  Wt 259 lb (117.482 kg)  BMI 36.14 kg/m2  SpO2 98%       Review of Systems  Constitutional: Negative for fever, chills, appetite change and fatigue.  HENT: Positive for congestion and sneezing. Negative for hearing loss, ear pain, sore throat, trouble swallowing, neck stiffness, dental problem, voice change and tinnitus.   Eyes: Negative for pain, discharge and visual disturbance.  Respiratory: Positive for cough. Negative for chest tightness, wheezing and stridor.   Cardiovascular: Negative for chest pain, palpitations and leg swelling.  Gastrointestinal: Negative for nausea, vomiting, abdominal pain, diarrhea, constipation, blood in stool and abdominal distention.  Genitourinary: Negative for urgency, hematuria, flank pain, discharge, difficulty urinating and genital sores.  Musculoskeletal: Negative for myalgias, back pain, joint swelling, arthralgias and gait problem.  Skin: Negative for rash.  Neurological: Negative for dizziness, syncope, speech difficulty, weakness, numbness and headaches.  Hematological: Negative for adenopathy. Does not bruise/bleed easily.  Psychiatric/Behavioral: Negative for behavioral problems and dysphoric mood. The patient is not nervous/anxious.        Objective:   Physical Exam  Constitutional: He is  oriented to person, place, and time. He appears well-developed.  HENT:  Head: Normocephalic.  Right Ear: External ear normal.  Left Ear: External ear normal.  Eyes: Conjunctivae and EOM are normal.  Neck: Normal range of motion.  Cardiovascular: Normal rate and normal heart sounds.   Pulmonary/Chest: Breath sounds normal.  Abdominal: Bowel sounds are normal.  Musculoskeletal: Normal range of motion. He exhibits no edema and no tenderness.  Neurological: He is alert and oriented to person, place, and time.  Psychiatric: He has a normal mood and affect. His behavior is normal.          Assessment  & Plan:   Hypertension well controlled. Repeat blood pressure 110/70 Viral URI with cough. We'll treat symptomatically. Will substitute Benicar for lisinopril for 2 weeks and then rechallenge on lisinopril

## 2012-10-21 NOTE — Patient Instructions (Signed)
Acute bronchitis symptoms for less than 10 days are generally not helped by antibiotics.  Take over-the-counter expectorants and cough medications such as  Mucinex DM.  Call if there is no improvement in 5 to 7 days or if he developed worsening cough, fever, or new symptoms, such as shortness of breath or chest pain.    

## 2012-11-19 ENCOUNTER — Telehealth: Payer: Self-pay | Admitting: Internal Medicine

## 2012-11-19 DIAGNOSIS — R05 Cough: Secondary | ICD-10-CM

## 2012-11-19 MED ORDER — OLMESARTAN MEDOXOMIL 20 MG PO TABS: 20.0000 mg | ORAL_TABLET | Freq: Every day | ORAL | Status: DC

## 2012-11-19 NOTE — Telephone Encounter (Signed)
Spoke to pt asked him if cough is better on Benicar? Pt stated yes cough is better still has slight cough. Told pt I will check with Dr. Amador Cunas to see whether to continue Benicar or go back on Lisinopril and get back to him. Pt verbalized understanding.

## 2012-11-19 NOTE — Telephone Encounter (Signed)
Pt saw Dr Kirtland Bouchard in June. Had a cough, so BP med was changed to Benicar - pt received samples of 20mg  once a day. Pt would like a prescription for this med.  [facility] [address].  Pt also wants a chest xray to be sure cough is gone? Please advise.

## 2012-11-20 MED ORDER — LOSARTAN POTASSIUM 100 MG PO TABS: 100.0000 mg | ORAL_TABLET | Freq: Every day | ORAL | Status: DC

## 2012-11-20 NOTE — Telephone Encounter (Signed)
Spoke to pt told him Dr. Kirtland Bouchard changed Bp Rx again to Losartan 100 mg daily and chest x-ray was ordered and someone will contact him. Pt verbalized understanding.

## 2012-11-20 NOTE — Telephone Encounter (Signed)
Dr. Amador Cunas, please see message below regarding Benicar and Lisinopril. According to your last office visit note I am not sure if you want pt to go back on Lisinopril?

## 2012-11-20 NOTE — Telephone Encounter (Signed)
CXR OK Change  blood pressure medication to losartan 100 mg #90 one daily

## 2012-12-11 ENCOUNTER — Ambulatory Visit (AMBULATORY_SURGERY_CENTER): Payer: BC Managed Care – PPO | Admitting: *Deleted

## 2012-12-11 ENCOUNTER — Encounter: Payer: Self-pay | Admitting: Gastroenterology

## 2012-12-11 VITALS — Ht 72.0 in | Wt 261.0 lb

## 2012-12-11 DIAGNOSIS — Z800 Family history of malignant neoplasm of digestive organs: Secondary | ICD-10-CM

## 2012-12-11 MED ORDER — MOVIPREP 100 G PO SOLR: ORAL | Status: DC

## 2012-12-11 NOTE — Progress Notes (Signed)
Patient denies any allergies to eggs or soy. 

## 2012-12-25 ENCOUNTER — Encounter: Payer: Self-pay | Admitting: Gastroenterology

## 2012-12-25 ENCOUNTER — Ambulatory Visit (AMBULATORY_SURGERY_CENTER): Payer: BC Managed Care – PPO | Admitting: Gastroenterology

## 2012-12-25 VITALS — BP 122/85 | HR 64 | Temp 97.6°F | Resp 18 | Ht 72.0 in | Wt 261.0 lb

## 2012-12-25 DIAGNOSIS — D126 Benign neoplasm of colon, unspecified: Secondary | ICD-10-CM

## 2012-12-25 DIAGNOSIS — Z800 Family history of malignant neoplasm of digestive organs: Secondary | ICD-10-CM

## 2012-12-25 DIAGNOSIS — Z1211 Encounter for screening for malignant neoplasm of colon: Secondary | ICD-10-CM

## 2012-12-25 MED ORDER — SODIUM CHLORIDE 0.9 % IV SOLN: 500.0000 mL | INTRAVENOUS | Status: DC

## 2012-12-25 NOTE — Progress Notes (Signed)
Patient did not have preoperative order for IV antibiotic SSI prophylaxis. (G8918)  Patient did not experience any of the following events: a burn prior to discharge; a fall within the facility; wrong site/side/patient/procedure/implant event; or a hospital transfer or hospital admission upon discharge from the facility. (G8907)  

## 2012-12-25 NOTE — Progress Notes (Signed)
Lidocaine-40mg IV prior to Propofol InductionPropofol given over incremental dosages 

## 2012-12-25 NOTE — Op Note (Signed)
[facility] [address] [place], [address]   COLONOSCOPY PROCEDURE REPORT  PATIENT: [name], [name]  MR#: [id number] BIRTHDATE: 08/07/1950 , 62  yrs. old GENDER: Male ENDOSCOPIST: Meryl Dare, MD, [facility] PROCEDURE DATE:  12/25/2012 PROCEDURE:   Colonoscopy with biopsy First Screening Colonoscopy - Avg.  risk and is 50 yrs.  old or older - No.  Prior Negative Screening - Now for repeat screening. 10 or more years since last screening  History of Adenoma - Now for follow-up colonoscopy & has been > or = to 3 yrs.  N/A  Polyps Removed Today? Yes. ASA CLASS:   Class II INDICATIONS:average risk screening. MEDICATIONS: MAC sedation, administered by CRNA and propofol (Diprivan) 250mg  IV DESCRIPTION OF PROCEDURE:   After the risks benefits and alternatives of the procedure were thoroughly explained, informed consent was obtained.  A digital rectal exam revealed no abnormalities of the rectum.   The LB [id number]  endoscope was introduced through the anus and advanced to the cecum, which was identified by both the appendix and ileocecal valve. No adverse events experienced.   The quality of the prep was excellent, using MoviPrep  The instrument was then slowly withdrawn as the colon was fully examined.  COLON FINDINGS: A sessile polyp measuring 4 mm in size was found in the sigmoid colon.  A polypectomy was performed with cold forceps. The resection was complete and the polyp tissue was completely retrieved.   Mild diverticulosis was noted in the sigmoid colon. The colon was otherwise normal.  There was no diverticulosis, inflammation, polyps or cancers unless previously stated. Retroflexed views revealed small internal hemorrhoids. The time to cecum=1 minutes 03 seconds.  Withdrawal time=11 minutes 00 seconds. The scope was withdrawn and the procedure completed. COMPLICATIONS: There were no complications.  ENDOSCOPIC IMPRESSION: 1.   Sessile polyp  measuring 4 mm in the sigmoid colon; polypectomy performed with cold forceps 2.   Mild diverticulosis was noted in the sigmoid colon 3.   Small internal hemorrrhoids  RECOMMENDATIONS: 1.  Await pathology results 2.  Repeat colonoscopy in 5 years if polyp adenomatous; otherwise 10 years 3.  High fiber diet with liberal fluid intake.  eSigned:  Meryl Dare, MD, [facility] 12/25/2012 8:47 AM

## 2012-12-25 NOTE — Patient Instructions (Addendum)
YOU HAD AN ENDOSCOPIC PROCEDURE TODAY AT THE [facility]: Refer to the procedure report that was given to you for any specific questions about what was found during the examination.  If the procedure report does not answer your questions, please call your gastroenterologist to clarify.  If you requested that your care partner not be given the details of your procedure findings, then the procedure report has been included in a sealed envelope for you to review at your convenience later.  YOU SHOULD EXPECT: Some feelings of bloating in the abdomen. Passage of more gas than usual.  Walking can help get rid of the air that was put into your GI tract during the procedure and reduce the bloating. If you had a lower endoscopy (such as a colonoscopy or flexible sigmoidoscopy) you may notice spotting of blood in your stool or on the toilet paper. If you underwent a bowel prep for your procedure, then you may not have a normal bowel movement for a few days.  DIET: Your first meal following the procedure should be a light meal and then it is ok to progress to your normal diet.  A half-sandwich or bowl of soup is an example of a good first meal.  Heavy or fried foods are harder to digest and may make you feel nauseous or bloated.  Likewise meals heavy in dairy and vegetables can cause extra gas to form and this can also increase the bloating.  Drink plenty of fluids but you should avoid alcoholic beverages for 24 hours.  ACTIVITY: Your care partner should take you home directly after the procedure.  You should plan to take it easy, moving slowly for the rest of the day.  You can resume normal activity the day after the procedure however you should NOT DRIVE or use heavy machinery for 24 hours (because of the sedation medicines used during the test).    SYMPTOMS TO REPORT IMMEDIATELY: A gastroenterologist can be reached at any hour.  During normal business hours, 8:30 AM to 5:00 PM Monday through Friday,  call [phone].  After hours and on weekends, please call the GI answering service at [phone] who will take a message and have the physician on call contact you.   Following lower endoscopy (colonoscopy or flexible sigmoidoscopy):  Excessive amounts of blood in the stool  Significant tenderness or worsening of abdominal pains  Swelling of the abdomen that is new, acute  Fever of 100F or higher    FOLLOW UP: If any biopsies were taken you will be contacted by phone or by letter within the next 1-3 weeks.  Call your gastroenterologist if you have not heard about the biopsies in 3 weeks.  Our staff will call the home number listed on your records the next business day following your procedure to check on you and address any questions or concerns that you may have at that time regarding the information given to you following your procedure. This is a courtesy call and so if there is no answer at the home number and we have not heard from you through the emergency physician on call, we will assume that you have returned to your regular daily activities without incident.  SIGNATURES/CONFIDENTIALITY: You and/or your care partner have signed paperwork which will be entered into your electronic medical record.  These signatures attest to the fact that that the information above on your After Visit Summary has been reviewed and is understood.  Full responsibility of the confidentiality   of this discharge information lies with you and/or your care-partner.     

## 2012-12-25 NOTE — Progress Notes (Signed)
Called to room to assist during endoscopic procedure.  Patient ID and intended procedure confirmed with present staff. Received instructions for my participation in the procedure from the performing physician.  

## 2012-12-26 ENCOUNTER — Telehealth: Payer: Self-pay | Admitting: *Deleted

## 2012-12-26 NOTE — Telephone Encounter (Signed)
  Follow up Call-  Call back number 12/25/2012  Post procedure Call Back phone  # [phone]  Permission to leave phone message Yes     Patient questions:  Do you have a fever, pain , or abdominal swelling? no Pain Score  0 *  Have you tolerated food without any problems? yes  Have you been able to return to your normal activities? yes  Do you have any questions about your discharge instructions: Diet   no Medications  no Follow up visit  no  Do you have questions or concerns about your Care? no  Actions: * If pain score is 4 or above: No action needed, pain <4.

## 2012-12-31 ENCOUNTER — Encounter: Payer: Self-pay | Admitting: Gastroenterology

## 2013-02-27 ENCOUNTER — Other Ambulatory Visit (INDEPENDENT_AMBULATORY_CARE_PROVIDER_SITE_OTHER): Payer: BC Managed Care – PPO

## 2013-02-27 DIAGNOSIS — Z0000 Encounter for general adult medical examination without abnormal findings: Secondary | ICD-10-CM

## 2013-02-27 LAB — POCT URINALYSIS DIPSTICK
Bilirubin, UA: NEGATIVE
Glucose, UA: NEGATIVE
Ketones, UA: NEGATIVE
Protein, UA: NEGATIVE
Spec Grav, UA: 1.025

## 2013-02-27 LAB — CBC WITH DIFFERENTIAL/PLATELET
Basophils Relative: 1.4 % (ref 0.0–3.0)
Eosinophils Absolute: 0.2 10*3/uL (ref 0.0–0.7)
MCHC: 32.6 g/dL (ref 30.0–36.0)
MCV: 76.4 fl — ABNORMAL LOW (ref 78.0–100.0)
Monocytes Absolute: 0.6 10*3/uL (ref 0.1–1.0)
Neutrophils Relative %: 37.8 % — ABNORMAL LOW (ref 43.0–77.0)
Platelets: 162 10*3/uL (ref 150.0–400.0)
RBC: 4.64 Mil/uL (ref 4.22–5.81)

## 2013-02-27 LAB — HEPATIC FUNCTION PANEL
Alkaline Phosphatase: 62 U/L (ref 39–117)
Bilirubin, Direct: 0.1 mg/dL (ref 0.0–0.3)
Total Bilirubin: 0.7 mg/dL (ref 0.3–1.2)

## 2013-02-27 LAB — LIPID PANEL
Cholesterol: 163 mg/dL (ref 0–200)
VLDL: 28.8 mg/dL (ref 0.0–40.0)

## 2013-02-27 LAB — TSH: TSH: 1.98 u[IU]/mL (ref 0.35–5.50)

## 2013-02-27 LAB — PSA: PSA: 1.77 ng/mL (ref 0.10–4.00)

## 2013-02-27 LAB — BASIC METABOLIC PANEL
BUN: 17 mg/dL (ref 6–23)
Calcium: 9.4 mg/dL (ref 8.4–10.5)
GFR: 68.08 mL/min (ref >60.00)
Potassium: 4.4 mEq/L (ref 3.5–5.1)

## 2013-03-06 ENCOUNTER — Encounter: Payer: Self-pay | Admitting: Internal Medicine

## 2013-03-06 ENCOUNTER — Ambulatory Visit (INDEPENDENT_AMBULATORY_CARE_PROVIDER_SITE_OTHER): Payer: BC Managed Care – PPO | Admitting: Internal Medicine

## 2013-03-06 VITALS — BP 110/70 | HR 94 | Temp 98.4°F | Resp 20 | Ht 70.5 in | Wt 263.0 lb

## 2013-03-06 DIAGNOSIS — I10 Essential (primary) hypertension: Secondary | ICD-10-CM

## 2013-03-06 DIAGNOSIS — Z0000 Encounter for general adult medical examination without abnormal findings: Secondary | ICD-10-CM

## 2013-03-06 DIAGNOSIS — Z23 Encounter for immunization: Secondary | ICD-10-CM

## 2013-03-06 MED ORDER — ZOLPIDEM TARTRATE 10 MG PO TABS: 10.0000 mg | ORAL_TABLET | Freq: Every evening | ORAL | Status: DC | PRN

## 2013-03-06 MED ORDER — TRAZODONE HCL 100 MG PO TABS: 100.0000 mg | ORAL_TABLET | Freq: Every day | ORAL | Status: DC

## 2013-03-06 MED ORDER — LOSARTAN POTASSIUM 100 MG PO TABS: 100.0000 mg | ORAL_TABLET | Freq: Every day | ORAL | Status: DC

## 2013-03-06 MED ORDER — SILDENAFIL CITRATE 100 MG PO TABS: 100.0000 mg | ORAL_TABLET | ORAL | Status: DC | PRN

## 2013-03-06 NOTE — Patient Instructions (Signed)
Limit your sodium (Salt) intake    It is important that you exercise regularly, at least 20 minutes 3 to 4 times per week.  If you develop chest pain or shortness of breath seek  medical attention.  You need to lose weight.  Consider a lower calorie diet and regular exercise.  Return in one year for follow-up  Please check your blood pressure on a regular basis.  If it is consistently greater than 150/90, please make an office appointment.

## 2013-03-06 NOTE — Progress Notes (Signed)
Subjective:    Patient ID: Dillon [name], male    DOB: [birth year], 62 y.o.   MRN: [id number] Pre-visit discussion using our clinic review tool. No additional management support is needed unless otherwise documented below in the visit note.  Subjective:    Patient ID: Dillon [name], male    DOB: 1950/07/02, 62 y.o.   MRN: [id number]  HPI   Wt Readings from Last 3 Encounters:  03/06/13 263 lb (119.296 kg)  12/25/12 261 lb (118.389 kg)  12/11/12 261 lb (118.17 kg)   62 year-old patient who is seen today for an annual examination. He has a history of treated hypertension which has been stable. He does monitor home blood pressure readings. He has a history of increasing PSA but today level is back down to approximately 1.0. He feels well. Denies any concerns or complaints.   Review of Systems  Constitutional: Negative for fever, chills, activity change, appetite change and fatigue.  HENT: Negative for hearing loss, ear pain, congestion, rhinorrhea, sneezing, mouth sores, trouble swallowing, neck pain, neck stiffness, dental problem, voice change, sinus pressure and tinnitus.   Eyes: Negative for photophobia, pain, redness and visual disturbance.  Respiratory: Negative for apnea, cough, choking, chest tightness, shortness of breath and wheezing.   Cardiovascular: Negative for chest pain, palpitations and leg swelling.  Gastrointestinal: Negative for nausea, vomiting, abdominal pain, diarrhea, constipation, blood in stool, abdominal distention, anal bleeding and rectal pain.  Genitourinary: Negative for dysuria, urgency, frequency, hematuria, flank pain, decreased urine volume, discharge, penile swelling, scrotal swelling, difficulty urinating, genital sores and testicular pain.  Musculoskeletal: Negative for myalgias, back pain, joint swelling, arthralgias and gait problem.  Skin: Negative for color change, rash and wound.  Neurological: Negative for dizziness, tremors, seizures, syncope,  facial asymmetry, speech difficulty, weakness, light-headedness, numbness and headaches.  Hematological: Negative for adenopathy. Does not bruise/bleed easily.  Psychiatric/Behavioral: Negative for suicidal ideas, hallucinations, behavioral problems, confusion, sleep disturbance, self-injury, dysphoric mood, decreased concentration and agitation. The patient is not nervous/anxious.        Objective:   Physical Exam  Constitutional: He appears well-developed and well-nourished.  HENT:  Head: Normocephalic and atraumatic.  Right Ear: External ear normal.  Left Ear: External ear normal.  Nose: Nose normal.  Mouth/Throat: Oropharynx is clear and moist.  Eyes: Conjunctivae and EOM are normal. Pupils are equal, round, and reactive to light. No scleral icterus.  Neck: Normal range of motion. Neck supple. No JVD present. No thyromegaly present.  Cardiovascular: Regular rhythm, normal heart sounds and intact distal pulses.  Exam reveals no gallop and no friction rub.   No murmur heard. Pulmonary/Chest: Effort normal and breath sounds normal. He exhibits no tenderness.  Abdominal: Soft. Bowel sounds are normal. He exhibits no distension and no mass. There is no tenderness.  Genitourinary: Prostate normal and penis normal.  Musculoskeletal: Normal range of motion. He exhibits no edema and no tenderness.  Lymphadenopathy:    He has no cervical adenopathy.  Neurological: He is alert. He has normal reflexes. No cranial nerve deficit. Coordination normal.  Skin: Skin is warm and dry. No rash noted.  Psychiatric: He has a normal mood and affect. His behavior is normal.          Assessment & Plan:   unremarkable clinical exam except for mild exogenous obesity  Mild dyslipidemia with a low HDL cholesterol. Lifestyle issues encouraged. He will attend more exercise and modest weight loss. There's been a 13 pound weight gain over the past  year HPI  Wt Readings from Last 3 Encounters:  03/06/13 263  lb (119.296 kg)  12/25/12 261 lb (118.389 kg)  12/11/12 261 lb (118.389 kg)    Review of Systems As above    Objective:   Physical Exam   As above     Assessment & Plan:   Preventive health exam Hypertension well controlled Exogenous obesity. Weight loss exercise low encouraged  We'll continue home blood pressure monitoring Recheck one year

## 2013-04-30 ENCOUNTER — Other Ambulatory Visit: Payer: Self-pay | Admitting: Internal Medicine

## 2013-05-08 ENCOUNTER — Ambulatory Visit (INDEPENDENT_AMBULATORY_CARE_PROVIDER_SITE_OTHER): Payer: BC Managed Care – PPO | Admitting: Internal Medicine

## 2013-05-08 ENCOUNTER — Encounter: Payer: Self-pay | Admitting: Internal Medicine

## 2013-05-08 ENCOUNTER — Ambulatory Visit (INDEPENDENT_AMBULATORY_CARE_PROVIDER_SITE_OTHER)
Admission: RE | Admit: 2013-05-08 | Discharge: 2013-05-08 | Disposition: A | Discharge status: Home / Self Care | Payer: BC Managed Care – PPO | Source: Ambulatory Visit | Attending: Internal Medicine | Admitting: Internal Medicine

## 2013-05-08 VITALS — BP 116/70 | HR 93 | Temp 97.7°F | Resp 16 | Ht 70.75 in | Wt 262.0 lb

## 2013-05-08 DIAGNOSIS — R05 Cough: Secondary | ICD-10-CM | POA: Insufficient documentation

## 2013-05-08 DIAGNOSIS — R059 Cough, unspecified: Secondary | ICD-10-CM

## 2013-05-08 DIAGNOSIS — J209 Acute bronchitis, unspecified: Secondary | ICD-10-CM

## 2013-05-08 MED ORDER — HYDROCODONE-HOMATROPINE 5-1.5 MG/5ML PO SYRP: 5.0000 mL | ORAL_SOLUTION | Freq: Four times a day (QID) | ORAL | Status: DC | PRN

## 2013-05-08 MED ORDER — AZITHROMYCIN 500 MG PO TABS: 500.0000 mg | ORAL_TABLET | Freq: Every day | ORAL | Status: DC

## 2013-05-08 NOTE — Patient Instructions (Signed)
Acute Bronchitis Bronchitis is inflammation of the airways that extend from the windpipe into the lungs (bronchi). The inflammation often causes mucus to develop. This leads to a cough, which is the most common symptom of bronchitis.  In acute bronchitis, the condition usually develops suddenly and goes away over time, usually in a couple weeks. Smoking, allergies, and asthma can make bronchitis worse. Repeated episodes of bronchitis may cause further lung problems.  CAUSES Acute bronchitis is most often caused by the same virus that causes a cold. The virus can spread from person to person (contagious).  SIGNS AND SYMPTOMS   Cough.   Fever.   Coughing up mucus.   Body aches.   Chest congestion.   Chills.   Shortness of breath.   Sore throat.  DIAGNOSIS  Acute bronchitis is usually diagnosed through a physical exam. Tests, such as chest X-rays, are sometimes done to rule out other conditions.  TREATMENT  Acute bronchitis usually goes away in a couple weeks. Often times, no medical treatment is necessary. Medicines are sometimes given for relief of fever or cough. Antibiotics are usually not needed but may be prescribed in certain situations. In some cases, an inhaler may be recommended to help reduce shortness of breath and control the cough. A cool mist vaporizer may also be used to help thin bronchial secretions and make it easier to clear the chest.  HOME CARE INSTRUCTIONS  Get plenty of rest.   Drink enough fluids to keep your urine clear or pale yellow (unless you have a medical condition that requires fluid restriction). Increasing fluids may help thin your secretions and will prevent dehydration.   Only take over-the-counter or prescription medicines as directed by your health care provider.   Avoid smoking and secondhand smoke. Exposure to cigarette smoke or irritating chemicals will make bronchitis worse. If you are a smoker, consider using nicotine gum or skin  patches to help control withdrawal symptoms. Quitting smoking will help your lungs heal faster.   Reduce the chances of another bout of acute bronchitis by washing your hands frequently, avoiding people with cold symptoms, and trying not to touch your hands to your mouth, nose, or eyes.   Follow up with your health care provider as directed.  SEEK MEDICAL CARE IF: Your symptoms do not improve after 1 week of treatment.  SEEK IMMEDIATE MEDICAL CARE IF:  You develop an increased fever or chills.   You have chest pain.   You have severe shortness of breath.  You have bloody sputum.   You develop dehydration.  You develop fainting.  You develop repeated vomiting.  You develop a severe headache. MAKE SURE YOU:   Understand these instructions.  Will watch your condition.  Will get help right away if you are not doing well or get worse. Document Released: 05/25/2004 Document Revised: 12/18/2012 Document Reviewed: 10/08/2012 [employer] Patient Information 2014 [employer].  

## 2013-05-09 ENCOUNTER — Encounter: Payer: Self-pay | Admitting: Internal Medicine

## 2013-05-09 NOTE — Progress Notes (Signed)
   Subjective:    Patient ID: Dillon [name], male    DOB: 1950/07/16, 63 y.o.   MRN: [id number]  Cough This is a new problem. The current episode started in the past 7 days. The problem has been gradually worsening. The problem occurs every few hours. The cough is productive of purulent sputum. Associated symptoms include a sore throat. Pertinent negatives include no chest pain, chills, ear congestion, ear pain, fever, headaches, heartburn, hemoptysis, myalgias, nasal congestion, postnasal drip, rash, rhinorrhea, shortness of breath, sweats, weight loss or wheezing. He has tried OTC cough suppressant for the symptoms. The treatment provided mild relief. His past medical history is significant for pneumonia. There is no history of bronchiectasis, bronchitis, COPD, emphysema or environmental allergies.      Review of Systems  Constitutional: Negative.  Negative for fever, chills, weight loss, diaphoresis, appetite change and fatigue.  HENT: Positive for sore throat. Negative for ear pain, postnasal drip and rhinorrhea.   Eyes: Negative.   Respiratory: Positive for cough. Negative for apnea, hemoptysis, choking, chest tightness, shortness of breath, wheezing and stridor.   Cardiovascular: Negative.  Negative for chest pain, palpitations and leg swelling.  Gastrointestinal: Negative.  Negative for heartburn, nausea, vomiting, abdominal pain and diarrhea.  Endocrine: Negative.   Genitourinary: Negative.  Negative for difficulty urinating.  Musculoskeletal: Negative.  Negative for myalgias.  Skin: Negative.  Negative for rash.  Allergic/Immunologic: Negative.  Negative for environmental allergies.  Neurological: Negative.  Negative for headaches.  Hematological: Negative.  Negative for adenopathy. Does not bruise/bleed easily.  Psychiatric/Behavioral: Negative.        Objective:   Physical Exam  Vitals reviewed. Constitutional: He is oriented to person, place, and time. He appears  well-developed and well-nourished.  Non-toxic appearance. He does not have a sickly appearance. He does not appear ill. No distress.  HENT:  Head: Normocephalic and atraumatic.  Mouth/Throat: Oropharynx is clear and moist. No oropharyngeal exudate.  Eyes: Conjunctivae are normal. Right eye exhibits no discharge. Left eye exhibits no discharge. No scleral icterus.  Neck: Normal range of motion. Neck supple. No JVD present. No tracheal deviation present. No thyromegaly present.  Cardiovascular: Normal rate, regular rhythm, normal heart sounds and intact distal pulses.  Exam reveals no gallop and no friction rub.   No murmur heard. Pulmonary/Chest: Effort normal and breath sounds normal. No stridor. No respiratory distress. He has no wheezes. He has no rales. He exhibits no tenderness.  Abdominal: Soft. Bowel sounds are normal. He exhibits no distension and no mass. There is no tenderness. There is no rebound and no guarding.  Musculoskeletal: Normal range of motion. He exhibits no edema and no tenderness.  Lymphadenopathy:    He has no cervical adenopathy.  Neurological: He is oriented to person, place, and time.  Skin: Skin is warm and dry. No rash noted. He is not diaphoretic. No erythema. No pallor.  Psychiatric: He has a normal mood and affect. His behavior is normal. Judgment and thought content normal.          Assessment & Plan:

## 2013-05-09 NOTE — Assessment & Plan Note (Signed)
I will treat the infection with Zpak and will control the cough with hycodan 

## 2013-05-09 NOTE — Assessment & Plan Note (Signed)
His CXR is neg for PNA 

## 2013-09-18 ENCOUNTER — Ambulatory Visit: Payer: BC Managed Care – PPO | Admitting: Internal Medicine

## 2013-09-19 ENCOUNTER — Ambulatory Visit (INDEPENDENT_AMBULATORY_CARE_PROVIDER_SITE_OTHER): Payer: BC Managed Care – PPO | Admitting: Internal Medicine

## 2013-09-19 ENCOUNTER — Encounter: Payer: Self-pay | Admitting: Internal Medicine

## 2013-09-19 VITALS — BP 110/70 | HR 108 | Temp 97.7°F | Wt 263.0 lb

## 2013-09-19 DIAGNOSIS — J209 Acute bronchitis, unspecified: Secondary | ICD-10-CM

## 2013-09-19 DIAGNOSIS — J069 Acute upper respiratory infection, unspecified: Secondary | ICD-10-CM

## 2013-09-19 DIAGNOSIS — R05 Cough: Secondary | ICD-10-CM

## 2013-09-19 DIAGNOSIS — R059 Cough, unspecified: Secondary | ICD-10-CM

## 2013-09-19 MED ORDER — HYDROCODONE-HOMATROPINE 5-1.5 MG/5ML PO SYRP: 5.0000 mL | ORAL_SOLUTION | Freq: Four times a day (QID) | ORAL | Status: AC | PRN

## 2013-09-19 MED ORDER — BENZONATATE 200 MG PO CAPS: 200.0000 mg | ORAL_CAPSULE | Freq: Two times a day (BID) | ORAL | Status: DC | PRN

## 2013-09-19 NOTE — Progress Notes (Signed)
Pre visit review using our clinic review tool, if applicable. No additional management support is needed unless otherwise documented below in the visit note. 

## 2013-09-19 NOTE — Progress Notes (Signed)
Subjective:    Patient ID: Dillon [name], male    DOB: 07/16/1950, 63 y.o.   MRN: [id number]  HPI  63 year old patient who is seen at a local urgent care 63 year old patient who is seen at a local urgent care.  He was treated symptomatically.  At that time.  He had fever, which has resolved.  In general, he feels reasonably well today, but does have persistent cough, and mild postnasal drip. He had acute bronchitis in January of this year and chest x-ray was obtained at that time.  This was reviewed and was normal.  Past Medical History  Diagnosis Date  . Hypertension   . ED (erectile dysfunction)   . Elevated liver function tests     History   Social History  . Marital Status: Married    Spouse Name: N/A    Number of Children: N/A  . Years of Education: N/A   Occupational History  . Not on file.   Social History Main Topics  . Smoking status: Former Smoker    Quit date: 05/02/1991  . Smokeless tobacco: Never Used  . Alcohol Use: 2.0 oz/week    4 drink(s) per week  . Drug Use: No  . Sexual Activity: Not on file   Other Topics Concern  . Not on file   Social History Narrative  . No narrative on file    Past Surgical History  Procedure Laterality Date  . Wrist ganglion excision      Family History  Problem Relation Age of Onset  . Coronary artery disease Brother   . Hyperlipidemia Brother   . Colon cancer Maternal Uncle 78  . Prostate cancer Maternal Uncle     No Known Allergies  Current Outpatient Prescriptions on File Prior to Visit  Medication Sig Dispense Refill  . aspirin 81 MG tablet Take 81 mg by mouth daily.        Marland Kitchen latanoprost (XALATAN) 0.005 % ophthalmic solution Place 1 drop into both eyes at bedtime.        Marland Kitchen losartan (COZAAR) 100 MG tablet Take 1 tablet (100 mg total) by mouth daily.  90 tablet  3  . sildenafil (VIAGRA) 100 MG tablet Take 1 tablet (100 mg total) by mouth as needed for erectile dysfunction.  6 tablet  6  . traZODone (DESYREL) 100 MG tablet Take 1  tablet (100 mg total) by mouth at bedtime.  90 tablet  3  . zolpidem (AMBIEN) 10 MG tablet TAKE ONE TABLET BY MOUTH AT BEDTIME AS NEEDED, PLEASE MAKE APPOINTMENT WITH DR Burnice Logan  30 tablet  0   No current facility-administered medications on file prior to visit.    BP 110/70  Pulse 108  Temp(Src) 97.7 F (36.5 C) (Oral)  Wt 263 lb (119.296 kg)  SpO2 97%       Review of Systems  Constitutional: Negative for fever, chills, appetite change and fatigue.  HENT: Negative for congestion, dental problem, ear pain, hearing loss, sore throat, tinnitus, trouble swallowing and voice change.   Eyes: Negative for pain, discharge and visual disturbance.  Respiratory: Positive for cough. Negative for chest tightness, wheezing and stridor.   Cardiovascular: Negative for chest pain, palpitations and leg swelling.  Gastrointestinal: Negative for nausea, vomiting, abdominal pain, diarrhea, constipation, blood in stool and abdominal distention.  Genitourinary: Negative for urgency, hematuria, flank pain, discharge, difficulty urinating and genital sores.  Musculoskeletal: Negative for arthralgias, back pain, gait problem, joint swelling, myalgias and neck stiffness.  Skin: Negative for rash.  Neurological: Negative for dizziness, syncope, speech difficulty, weakness, numbness and headaches.  Hematological: Negative for adenopathy. Does not bruise/bleed easily.  Psychiatric/Behavioral: Negative for behavioral problems and dysphoric mood. The patient is not nervous/anxious.        Objective:   Physical Exam  Constitutional: He is oriented to person, place, and time. He appears well-developed.  HENT:  Head: Normocephalic.  Right Ear: External ear normal.  Left Ear: External ear normal.  Eyes: Conjunctivae and EOM are normal.  Neck: Normal range of motion.  Cardiovascular: Normal rate and normal heart sounds.   Pulmonary/Chest: Effort normal and breath sounds normal. No respiratory distress.  He has no wheezes. He has no rales.  Abdominal: Bowel sounds are normal.  Musculoskeletal: Normal range of motion. He exhibits no edema and no tenderness.  Neurological: He is alert and oriented to person, place, and time.  Psychiatric: He has a normal mood and affect. His behavior is normal.          Assessment & Plan:   Viral URI with cough.  We'll treat the cough symptomatically.  He is aware that cough can last as long as 6 weeks.  He will report any new or worsening symptoms.

## 2013-09-19 NOTE — Patient Instructions (Signed)
Acute bronchitis symptoms  are generally not helped by antibiotics.  Take over-the-counter expectorants and cough medications such as  Mucinex DM.  Call if there is no improvement in 5 to 7 days or if  you develop worsening cough, fever, or new symptoms, such as shortness of breath or chest pain. 

## 2014-01-23 ENCOUNTER — Other Ambulatory Visit: Payer: Self-pay | Admitting: Internal Medicine

## 2014-03-30 ENCOUNTER — Other Ambulatory Visit: Payer: Self-pay | Admitting: Internal Medicine

## 2014-04-21 ENCOUNTER — Other Ambulatory Visit: Payer: Self-pay | Admitting: Internal Medicine

## 2014-06-15 ENCOUNTER — Other Ambulatory Visit: Payer: Self-pay

## 2014-06-18 ENCOUNTER — Other Ambulatory Visit (INDEPENDENT_AMBULATORY_CARE_PROVIDER_SITE_OTHER): Payer: BLUE CROSS/BLUE SHIELD

## 2014-06-18 DIAGNOSIS — Z0000 Encounter for general adult medical examination without abnormal findings: Secondary | ICD-10-CM | POA: Diagnosis not present

## 2014-06-18 LAB — CBC WITH DIFFERENTIAL/PLATELET
BASOS PCT: 1.1 % (ref 0.0–3.0)
Basophils Absolute: 0.1 10*3/uL (ref 0.0–0.1)
EOS ABS: 0.2 10*3/uL (ref 0.0–0.7)
Eosinophils Relative: 4.2 % (ref 0.0–5.0)
HEMATOCRIT: 36.3 % — AB (ref 39.0–52.0)
HEMOGLOBIN: 11.7 g/dL — AB (ref 13.0–17.0)
LYMPHS PCT: 47.5 % — AB (ref 12.0–46.0)
Lymphs Abs: 2.7 10*3/uL (ref 0.7–4.0)
MCHC: 32.2 g/dL (ref 30.0–36.0)
MCV: 71.9 fl — AB (ref 78.0–100.0)
MONO ABS: 0.8 10*3/uL (ref 0.1–1.0)
Monocytes Relative: 13.7 % — ABNORMAL HIGH (ref 3.0–12.0)
NEUTROS PCT: 33.5 % — AB (ref 43.0–77.0)
Neutro Abs: 1.9 10*3/uL (ref 1.4–7.7)
PLATELETS: 197 10*3/uL (ref 150.0–400.0)
RBC: 5.04 Mil/uL (ref 4.22–5.81)
RDW: 17.7 % — ABNORMAL HIGH (ref 11.5–15.5)
WBC: 5.7 10*3/uL (ref 4.0–10.5)

## 2014-06-18 LAB — HEPATIC FUNCTION PANEL
ALK PHOS: 76 U/L (ref 39–117)
ALT: 55 U/L — ABNORMAL HIGH (ref 0–53)
AST: 44 U/L — AB (ref 0–37)
Albumin: 4.2 g/dL (ref 3.5–5.2)
BILIRUBIN DIRECT: 0.1 mg/dL (ref 0.0–0.3)
BILIRUBIN TOTAL: 0.7 mg/dL (ref 0.2–1.2)
Total Protein: 7.3 g/dL (ref 6.0–8.3)

## 2014-06-18 LAB — BASIC METABOLIC PANEL
BUN: 24 mg/dL — AB (ref 6–23)
CHLORIDE: 102 meq/L (ref 96–112)
CO2: 29 meq/L (ref 19–32)
Calcium: 9.6 mg/dL (ref 8.4–10.5)
Creatinine, Ser: 1.29 mg/dL (ref 0.40–1.50)
GFR: 59.96 mL/min — AB (ref >60.00)
GLUCOSE: 111 mg/dL — AB (ref 70–99)
Potassium: 5 mEq/L (ref 3.5–5.1)
Sodium: 135 mEq/L (ref 135–145)

## 2014-06-18 LAB — LIPID PANEL
CHOL/HDL RATIO: 5
Cholesterol: 145 mg/dL (ref 0–200)
HDL: 30.1 mg/dL — ABNORMAL LOW (ref >39.00)
LDL CALC: 85 mg/dL (ref 0–99)
NonHDL: 114.9
TRIGLYCERIDES: 151 mg/dL — AB (ref 0.0–149.0)
VLDL: 30.2 mg/dL (ref 0.0–40.0)

## 2014-06-18 LAB — POCT URINALYSIS DIP (MANUAL ENTRY)
BILIRUBIN UA: NEGATIVE
Bilirubin, UA: NEGATIVE
Glucose, UA: NEGATIVE
Leukocytes, UA: NEGATIVE
NITRITE UA: NEGATIVE
PROTEIN UA: NEGATIVE
RBC UA: NEGATIVE
SPEC GRAV UA: 1.02
UROBILINOGEN UA: 0.2
pH, UA: 6.5

## 2014-06-18 LAB — TSH: TSH: 2.64 u[IU]/mL (ref 0.35–4.50)

## 2014-06-18 LAB — PSA: PSA: 1.67 ng/mL (ref 0.10–4.00)

## 2014-06-22 ENCOUNTER — Encounter: Payer: Self-pay | Admitting: Internal Medicine

## 2014-06-22 ENCOUNTER — Ambulatory Visit (INDEPENDENT_AMBULATORY_CARE_PROVIDER_SITE_OTHER): Payer: BLUE CROSS/BLUE SHIELD | Admitting: Internal Medicine

## 2014-06-22 ENCOUNTER — Encounter: Payer: Self-pay | Admitting: *Deleted

## 2014-06-22 VITALS — BP 138/84 | HR 94 | Temp 98.1°F | Resp 20 | Ht 70.5 in | Wt 273.0 lb

## 2014-06-22 DIAGNOSIS — I10 Essential (primary) hypertension: Secondary | ICD-10-CM | POA: Diagnosis not present

## 2014-06-22 DIAGNOSIS — Z0000 Encounter for general adult medical examination without abnormal findings: Secondary | ICD-10-CM

## 2014-06-22 DIAGNOSIS — D509 Iron deficiency anemia, unspecified: Secondary | ICD-10-CM

## 2014-06-22 LAB — FERRITIN: Ferritin: 6.5 ng/mL — ABNORMAL LOW (ref 22.0–322.0)

## 2014-06-22 MED ORDER — OMEPRAZOLE 40 MG PO CPDR: 40.0000 mg | DELAYED_RELEASE_CAPSULE | Freq: Every day | ORAL | Status: DC

## 2014-06-22 NOTE — Progress Notes (Signed)
Pre visit review using our clinic review tool, if applicable. No additional management support is needed unless otherwise documented below in the visit note. 

## 2014-06-22 NOTE — Progress Notes (Signed)
Subjective:    Patient ID: Dillon [name], male    DOB: [birth year], 64 y.o.   MRN: [id number]  HPI  Wt Readings from Last 3 Encounters:  06/22/14 273 lb (123.832 kg)  09/19/13 263 lb (119.296 kg)  05/08/13 262 lb (118.74 kg)   64 year old patient who is seen today for a preventive health examination.  He is doing well.  He has hypertension which has been not well controlled. One year ago he was noted to have mild microcytic anemia felt related to the frequent blood donations to the [employer].  He did have a normal colonoscopy in 2014.  The patient has persistent mild microcytic anemia with mild neutropenia.  He has given 1 blood donation over the past 6 months.  He does have a history of frequent reflux symptoms.  He takes Prilosec intermittently and also frequent use of times.  No change in bowel habits  Past Medical History  Diagnosis Date  . Hypertension   . ED (erectile dysfunction)   . Elevated liver function tests     History   Social History  . Marital Status: Married    Spouse Name: N/A  . Number of Children: N/A  . Years of Education: N/A   Occupational History  . Not on file.   Social History Main Topics  . Smoking status: Former Smoker    Quit date: 05/02/1991  . Smokeless tobacco: Never Used  . Alcohol Use: 2.0 oz/week    4 drink(s) per week  . Drug Use: No  . Sexual Activity: Not on file   Other Topics Concern  . Not on file   Social History Narrative    Past Surgical History  Procedure Laterality Date  . Wrist ganglion excision      Family History  Problem Relation Age of Onset  . Coronary artery disease Brother   . Hyperlipidemia Brother   . Colon cancer Maternal Uncle 78  . Prostate cancer Maternal Uncle     No Known Allergies  Current Outpatient Prescriptions on File Prior to Visit  Medication Sig Dispense Refill  . aspirin 81 MG tablet Take 81 mg by mouth daily.      Marland Kitchen latanoprost (XALATAN) 0.005 % ophthalmic solution Place 1 drop  into both eyes at bedtime.      Marland Kitchen losartan (COZAAR) 100 MG tablet TAKE 1 TABLET (100 MG TOTAL) BY MOUTH DAILY. 90 tablet 1  . traZODone (DESYREL) 100 MG tablet Take 1 tablet (100 mg total) by mouth at bedtime. 90 tablet 3  . VIAGRA 100 MG tablet TAKE 1 TABLET (100 MG TOTAL) BY MOUTH AS NEEDED FOR ERECTILE DYSFUNCTION. 4 tablet 1  . zolpidem (AMBIEN) 10 MG tablet TAKE ONE TABLET BY MOUTH AT BEDTIME AS NEEDED 30 tablet 2   No current facility-administered medications on file prior to visit.    BP 138/84 mmHg  Pulse 94  Temp(Src) 98.1 F (36.7 C) (Oral)  Resp 20  Ht 5' 10.5" (1.791 m)  Wt 273 lb (123.832 kg)  BMI 38.60 kg/m2  SpO2 98%     Past Medical History  Diagnosis Date  . Hypertension   . ED (erectile dysfunction)   . Elevated liver function tests     History   Social History  . Marital Status: Married    Spouse Name: N/A  . Number of Children: N/A  . Years of Education: N/A   Occupational History  . Not on file.   Social History Main Topics  .  Smoking status: Former Smoker    Quit date: 05/02/1991  . Smokeless tobacco: Never Used  . Alcohol Use: 2.0 oz/week    4 drink(s) per week  . Drug Use: No  . Sexual Activity: Not on file   Other Topics Concern  . Not on file   Social History Narrative    Past Surgical History  Procedure Laterality Date  . Wrist ganglion excision      Family History  Problem Relation Age of Onset  . Coronary artery disease Brother   . Hyperlipidemia Brother   . Colon cancer Maternal Uncle 78  . Prostate cancer Maternal Uncle     No Known Allergies  Current Outpatient Prescriptions on File Prior to Visit  Medication Sig Dispense Refill  . aspirin 81 MG tablet Take 81 mg by mouth daily.      Marland Kitchen latanoprost (XALATAN) 0.005 % ophthalmic solution Place 1 drop into both eyes at bedtime.      Marland Kitchen losartan (COZAAR) 100 MG tablet TAKE 1 TABLET (100 MG TOTAL) BY MOUTH DAILY. 90 tablet 1  . traZODone (DESYREL) 100 MG tablet Take 1  tablet (100 mg total) by mouth at bedtime. 90 tablet 3  . VIAGRA 100 MG tablet TAKE 1 TABLET (100 MG TOTAL) BY MOUTH AS NEEDED FOR ERECTILE DYSFUNCTION. 4 tablet 1  . zolpidem (AMBIEN) 10 MG tablet TAKE ONE TABLET BY MOUTH AT BEDTIME AS NEEDED 30 tablet 2   No current facility-administered medications on file prior to visit.    BP 138/84 mmHg  Pulse 94  Temp(Src) 98.1 F (36.7 C) (Oral)  Resp 20  Ht 5' 10.5" (1.791 m)  Wt 273 lb (123.832 kg)  BMI 38.60 kg/m2  SpO2 98%    Review of Systems  Constitutional: Negative for fever, chills, activity change, appetite change and fatigue.  HENT: Negative for congestion, dental problem, ear pain, hearing loss, mouth sores, rhinorrhea, sinus pressure, sneezing, tinnitus, trouble swallowing and voice change.   Eyes: Negative for photophobia, pain, redness and visual disturbance.  Respiratory: Negative for apnea, cough, choking, chest tightness, shortness of breath and wheezing.   Cardiovascular: Negative for chest pain, palpitations and leg swelling.  Gastrointestinal: Negative for nausea, vomiting, abdominal pain, diarrhea, constipation, blood in stool, abdominal distention, anal bleeding and rectal pain.       Dyspepsia  Genitourinary: Negative for dysuria, urgency, frequency, hematuria, flank pain, decreased urine volume, discharge, penile swelling, scrotal swelling, difficulty urinating, genital sores and testicular pain.  Musculoskeletal: Negative for myalgias, back pain, joint swelling, arthralgias, gait problem, neck pain and neck stiffness.  Skin: Negative for color change, rash and wound.  Neurological: Negative for dizziness, tremors, seizures, syncope, facial asymmetry, speech difficulty, weakness, light-headedness, numbness and headaches.  Hematological: Negative for adenopathy. Does not bruise/bleed easily.  Psychiatric/Behavioral: Negative for suicidal ideas, hallucinations, behavioral problems, confusion, sleep disturbance,  self-injury, dysphoric mood, decreased concentration and agitation. The patient is not nervous/anxious.        Objective:   Physical Exam  Constitutional: He appears well-developed and well-nourished. No distress.  Blood pressure 130/80 Overweight  HENT:  Head: Normocephalic and atraumatic.  Right Ear: External ear normal.  Left Ear: External ear normal.  Nose: Nose normal.  Mouth/Throat: Oropharynx is clear and moist.  Eyes: Conjunctivae and EOM are normal. Pupils are equal, round, and reactive to light. No scleral icterus.  Neck: Normal range of motion. Neck supple. No JVD present. No thyromegaly present.  Cardiovascular: Regular rhythm, normal heart sounds and intact distal  pulses.  Exam reveals no gallop and no friction rub.   No murmur heard. Pulmonary/Chest: Effort normal and breath sounds normal. He exhibits no tenderness.  Abdominal: Soft. Bowel sounds are normal. He exhibits no distension and no mass. There is no tenderness.  Genitourinary: Prostate normal and penis normal. Guaiac negative stool.  Musculoskeletal: Normal range of motion. He exhibits no edema or tenderness.  Lymphadenopathy:    He has no cervical adenopathy.  Neurological: He is alert. He has normal reflexes. No cranial nerve deficit. Coordination normal.  Skin: Skin is warm and dry. No rash noted.  Psychiatric: He has a normal mood and affect. His behavior is normal.          Assessment & Plan:   Preventive health exam Obesity Hypertension, controlled Mild microcytic anemia with mild neutropenia.  We'll check stool for occult blood and check a ferritin level.  We'll repeat a CBC in one month after one month of PPI therapy and iron supplementation.

## 2014-06-22 NOTE — Patient Instructions (Addendum)
Avoids foods high in acid such as tomatoes citrus juices, and spicy foods.  Avoid eating within two hours of lying down or before exercising.  Do not overheat.  Try smaller more frequent meals.   Take an iron supplement daily  Check  A CBC in 4 weeks  Check stool for blood as discussed and return slidesGastroesophageal Reflux Disease, Adult Gastroesophageal reflux disease (GERD) happens when acid from your stomach flows up into the esophagus. When acid comes in contact with the esophagus, the acid causes soreness (inflammation) in the esophagus. Over time, GERD may create small holes (ulcers) in the lining of the esophagus. CAUSES   Increased body weight. This puts pressure on the stomach, making acid rise from the stomach into the esophagus.  Smoking. This increases acid production in the stomach.  Drinking alcohol. This causes decreased pressure in the lower esophageal sphincter (valve or ring of muscle between the esophagus and stomach), allowing acid from the stomach into the esophagus.  Late evening meals and a full stomach. This increases pressure and acid production in the stomach.  A malformed lower esophageal sphincter. Sometimes, no cause is found. SYMPTOMS   Burning pain in the lower part of the mid-chest behind the breastbone and in the mid-stomach area. This may occur twice a week or more often.  Trouble swallowing.  Sore throat.  Dry cough.  Asthma-like symptoms including chest tightness, shortness of breath, or wheezing. DIAGNOSIS  Your caregiver may be able to diagnose GERD based on your symptoms. In some cases, X-rays and other tests may be done to check for complications or to check the condition of your stomach and esophagus. TREATMENT  Your caregiver may recommend over-the-counter or prescription medicines to help decrease acid production. Ask your caregiver before starting or adding any new medicines.  HOME CARE INSTRUCTIONS   Change the factors that you can  control. Ask your caregiver for guidance concerning weight loss, quitting smoking, and alcohol consumption.  Avoid foods and drinks that make your symptoms worse, such as:  Caffeine or alcoholic drinks.  Chocolate.  Peppermint or mint flavorings.  Garlic and onions.  Spicy foods.  Citrus fruits, such as oranges, lemons, or limes.  Tomato-based foods such as sauce, chili, salsa, and pizza.  Fried and fatty foods.  Avoid lying down for the 3 hours prior to your bedtime or prior to taking a nap.  Eat small, frequent meals instead of large meals.  Wear loose-fitting clothing. Do not wear anything tight around your waist that causes pressure on your stomach.  Raise the head of your bed 6 to 8 inches with wood blocks to help you sleep. Extra pillows will not help.  Only take over-the-counter or prescription medicines for pain, discomfort, or fever as directed by your caregiver.  Do not take aspirin, ibuprofen, or other nonsteroidal anti-inflammatory drugs (NSAIDs). SEEK IMMEDIATE MEDICAL CARE IF:   You have pain in your arms, neck, jaw, teeth, or back.  Your pain increases or changes in intensity or duration.  You develop nausea, vomiting, or sweating (diaphoresis).  You develop shortness of breath, or you faint.  Your vomit is green, yellow, black, or looks like coffee grounds or blood.  Your stool is red, bloody, or black. These symptoms could be signs of other problems, such as heart disease, gastric bleeding, or esophageal bleeding. MAKE SURE YOU:   Understand these instructions.  Will watch your condition.  Will get help right away if you are not doing well or get worse. Document  Released: 01/25/2005 Document Revised: 07/10/2011 Document Reviewed: 11/04/2010 [facility] Patient Information 2015 [place], [place]. This information is not intended to replace advice given to you by your health care provider. Make sure you discuss any questions you have with your health  care provider. Health Maintenance A healthy lifestyle and preventative care can promote health and wellness.  Maintain regular health, dental, and eye exams.  Eat a healthy diet. Foods like vegetables, fruits, whole grains, low-fat dairy products, and lean protein foods contain the nutrients you need and are low in calories. Decrease your intake of foods high in solid fats, added sugars, and salt. Get information about a proper diet from your health care provider, if necessary.  Regular physical exercise is one of the most important things you can do for your health. Most adults should get at least 150 minutes of moderate-intensity exercise (any activity that increases your heart rate and causes you to sweat) each week. In addition, most adults need muscle-strengthening exercises on 2 or more days a week.   Maintain a healthy weight. The body mass index (BMI) is a screening tool to identify possible weight problems. It provides an estimate of body fat based on height and weight. Your health care provider can find your BMI and can help you achieve or maintain a healthy weight. For males 20 years and older:  A BMI below 18.5 is considered underweight.  A BMI of 18.5 to 24.9 is normal.  A BMI of 25 to 29.9 is considered overweight.  A BMI of 30 and above is considered obese.  Maintain normal blood lipids and cholesterol by exercising and minimizing your intake of saturated fat. Eat a balanced diet with plenty of fruits and vegetables. Blood tests for lipids and cholesterol should begin at age 77 and be repeated every 5 years. If your lipid or cholesterol levels are high, you are over age 28, or you are at high risk for heart disease, you may need your cholesterol levels checked more frequently.Ongoing high lipid and cholesterol levels should be treated with medicines if diet and exercise are not working.  If you smoke, find out from your health care provider how to quit. If you do not use  tobacco, do not start.  Lung cancer screening is recommended for adults aged 73-80 years who are at high risk for developing lung cancer because of a history of smoking. A yearly low-dose CT scan of the lungs is recommended for people who have at least a 30-pack-year history of smoking and are current smokers or have quit within the past 15 years. A pack year of smoking is smoking an average of 1 pack of cigarettes a day for 1 year (for example, a 30-pack-year history of smoking could mean smoking 1 pack a day for 30 years or 2 packs a day for 15 years). Yearly screening should continue until the smoker has stopped smoking for at least 15 years. Yearly screening should be stopped for people who develop a health problem that would prevent them from having lung cancer treatment.  If you choose to drink alcohol, do not have more than 2 drinks per day. One drink is considered to be 12 oz (360 mL) of beer, 5 oz (150 mL) of wine, or 1.5 oz (45 mL) of liquor.  Avoid the use of street drugs. Do not share needles with anyone. Ask for help if you need support or instructions about stopping the use of drugs.  High blood pressure causes heart disease and increases  the risk of stroke. Blood pressure should be checked at least every 1-2 years. Ongoing high blood pressure should be treated with medicines if weight loss and exercise are not effective.  If you are 82-34 years old, ask your health care provider if you should take aspirin to prevent heart disease.  Diabetes screening involves taking a blood sample to check your fasting blood sugar level. This should be done once every 3 years after age 19 if you are at a normal weight and without risk factors for diabetes. Testing should be considered at a younger age or be carried out more frequently if you are overweight and have at least 1 risk factor for diabetes.  Colorectal cancer can be detected and often prevented. Most routine colorectal cancer screening begins  at the age of 37 and continues through age 76. However, your health care provider may recommend screening at an earlier age if you have risk factors for colon cancer. On a yearly basis, your health care provider may provide home test kits to check for hidden blood in the stool. A small camera at the end of a tube may be used to directly examine the colon (sigmoidoscopy or colonoscopy) to detect the earliest forms of colorectal cancer. Talk to your health care provider about this at age 61 when routine screening begins. A direct exam of the colon should be repeated every 5-10 years through age 27, unless early forms of precancerous polyps or small growths are found.  People who are at an increased risk for hepatitis B should be screened for this virus. You are considered at high risk for hepatitis B if:  You were born in a country where hepatitis B occurs often. Talk with your health care provider about which countries are considered high risk.  Your parents were born in a high-risk country and you have not received a shot to protect against hepatitis B (hepatitis B vaccine).  You have HIV or AIDS.  You use needles to inject street drugs.  You live with, or have sex with, someone who has hepatitis B.  You are a man who has sex with other men (MSM).  You get hemodialysis treatment.  You take certain medicines for conditions like cancer, organ transplantation, and autoimmune conditions.  Hepatitis C blood testing is recommended for all people born from [birth year] through 1963 and any individual with known risk factors for hepatitis C.  Healthy men should no longer receive prostate-specific antigen (PSA) blood tests as part of routine cancer screening. Talk to your health care provider about prostate cancer screening.  Testicular cancer screening is not recommended for adolescents or adult males who have no symptoms. Screening includes self-exam, a health care provider exam, and other screening tests.  Consult with your health care provider about any symptoms you have or any concerns you have about testicular cancer.  Practice safe sex. Use condoms and avoid high-risk sexual practices to reduce the spread of sexually transmitted infections (STIs).  You should be screened for STIs, including gonorrhea and chlamydia if:  You are sexually active and are younger than 24 years.  You are older than 24 years, and your health care provider tells you that you are at risk for this type of infection.  Your sexual activity has changed since you were last screened, and you are at an increased risk for chlamydia or gonorrhea. Ask your health care provider if you are at risk.  If you are at risk of being infected with HIV, it  is recommended that you take a prescription medicine daily to prevent HIV infection. This is called pre-exposure prophylaxis (PrEP). You are considered at risk if:  You are a man who has sex with other men (MSM).  You are a heterosexual man who is sexually active with multiple partners.  You take drugs by injection.  You are sexually active with a partner who has HIV.  Talk with your health care provider about whether you are at high risk of being infected with HIV. If you choose to begin PrEP, you should first be tested for HIV. You should then be tested every 3 months for as long as you are taking PrEP.  Use sunscreen. Apply sunscreen liberally and repeatedly throughout the day. You should seek shade when your shadow is shorter than you. Protect yourself by wearing long sleeves, pants, a wide-brimmed hat, and sunglasses year round whenever you are outdoors.  Tell your health care provider of new moles or changes in moles, especially if there is a change in shape or color. Also, tell your health care provider if a mole is larger than the size of a pencil eraser.  A one-time screening for abdominal aortic aneurysm (AAA) and surgical repair of large AAAs by ultrasound is  recommended for men aged 25-75 years who are current or former smokers.  Stay current with your vaccines (immunizations). Document Released: 10/14/2007 Document Revised: 04/22/2013 Document Reviewed: 09/12/2010 [facility] Patient Information 2015 [place], [place]. This information is not intended to replace advice given to you by your health care provider. Make sure you discuss any questions you have with your health care provider.

## 2014-06-23 ENCOUNTER — Telehealth: Payer: Self-pay | Admitting: Internal Medicine

## 2014-06-23 NOTE — Telephone Encounter (Signed)
emmi mailed  °

## 2014-07-08 ENCOUNTER — Other Ambulatory Visit (INDEPENDENT_AMBULATORY_CARE_PROVIDER_SITE_OTHER): Payer: BLUE CROSS/BLUE SHIELD

## 2014-07-08 DIAGNOSIS — R195 Other fecal abnormalities: Secondary | ICD-10-CM

## 2014-07-08 LAB — POC HEMOCCULT BLD/STL (HOME/3-CARD/SCREEN)
Card #2 Fecal Occult Blod, POC: NEGATIVE
FECAL OCCULT BLD: NEGATIVE
FECAL OCCULT BLD: NEGATIVE

## 2014-08-14 ENCOUNTER — Other Ambulatory Visit: Payer: Self-pay | Admitting: Internal Medicine

## 2014-08-26 ENCOUNTER — Encounter: Payer: Self-pay | Admitting: Gastroenterology

## 2014-08-28 ENCOUNTER — Telehealth: Payer: Self-pay

## 2014-08-28 NOTE — Telephone Encounter (Signed)
[facility] [place] refill request for zolpidem (AMBIEN) 10 MG tablet

## 2014-09-01 MED ORDER — ZOLPIDEM TARTRATE 10 MG PO TABS: 10.0000 mg | ORAL_TABLET | Freq: Every evening | ORAL | Status: DC | PRN

## 2014-09-01 NOTE — Telephone Encounter (Signed)
Rx called in to pharmacy. 

## 2014-10-08 ENCOUNTER — Telehealth: Payer: Self-pay | Admitting: Internal Medicine

## 2014-10-08 DIAGNOSIS — D509 Iron deficiency anemia, unspecified: Secondary | ICD-10-CM

## 2014-10-08 NOTE — Telephone Encounter (Signed)
Pt seems to think dr Raliegh Ip wanted him to do labs before his 8/22 , 6 mo fup. escpecially after the hemocult test. pls advise

## 2014-10-08 NOTE — Telephone Encounter (Signed)
Spoke to pt, told him he was suppose to come back a month after the last test. Pt said I thought so. Asked pt if taking Iron supplement? Pt said yes, one a day. Told pt can come in one week before visit on 8/22 and have CBC done and then results will be back for visit. Pt verbalized understanding. Told pt just call back and schedule lab appointment week before and I will put order in. Pt verbalized understanding.

## 2014-10-16 ENCOUNTER — Other Ambulatory Visit: Payer: Self-pay | Admitting: Internal Medicine

## 2014-11-18 ENCOUNTER — Other Ambulatory Visit: Payer: Self-pay | Admitting: Internal Medicine

## 2014-11-26 ENCOUNTER — Other Ambulatory Visit: Payer: Self-pay | Admitting: Internal Medicine

## 2015-02-25 ENCOUNTER — Encounter: Payer: Self-pay | Admitting: Family Medicine

## 2015-02-25 ENCOUNTER — Ambulatory Visit (INDEPENDENT_AMBULATORY_CARE_PROVIDER_SITE_OTHER): Payer: BLUE CROSS/BLUE SHIELD | Admitting: Family Medicine

## 2015-02-25 VITALS — BP 120/80 | HR 93 | Temp 98.3°F | Wt 268.8 lb

## 2015-02-25 DIAGNOSIS — J209 Acute bronchitis, unspecified: Secondary | ICD-10-CM | POA: Diagnosis not present

## 2015-02-25 MED ORDER — HYDROCODONE-HOMATROPINE 5-1.5 MG/5ML PO SYRP: 5.0000 mL | ORAL_SOLUTION | Freq: Four times a day (QID) | ORAL | Status: AC | PRN

## 2015-02-25 MED ORDER — BENZONATATE 200 MG PO CAPS: 200.0000 mg | ORAL_CAPSULE | Freq: Three times a day (TID) | ORAL | Status: DC | PRN

## 2015-02-25 NOTE — Progress Notes (Signed)
   Subjective:    Patient ID: Dillon [name], male    DOB: Feb 22, 1951, 64 y.o.   MRN: [id number]  HPI Acute visit. Ex-smoker with one-week history of cough. Mostly nonproductive. Especially bothersome at night. He has taken some over-the-counter remedies without relief. Minimal nasal congestion. No fevers or chills. No dyspnea. No wheezing. Denies any GERD symptoms. No sick contacts. Not sleeping well secondary to cough  Past Medical History  Diagnosis Date  . Hypertension   . ED (erectile dysfunction)   . Elevated liver function tests    Past Surgical History  Procedure Laterality Date  . Wrist ganglion excision      reports that he quit smoking about 23 years ago. He has never used smokeless tobacco. He reports that he drinks about 2.0 oz of alcohol per week. He reports that he does not use illicit drugs. family history includes Colon cancer (age of onset: 23) in his maternal uncle; Coronary artery disease in his brother; Hyperlipidemia in his brother; Prostate cancer in his maternal uncle. No Known Allergies    Review of Systems  Constitutional: Negative for fever and chills.  HENT: Positive for congestion.   Respiratory: Positive for cough. Negative for shortness of breath and wheezing.        Objective:   Physical Exam  Constitutional: He appears well-developed and well-nourished. No distress.  HENT:  Right Ear: External ear normal.  Left Ear: External ear normal.  Mouth/Throat: Oropharynx is clear and moist.  Neck: Neck supple.  Cardiovascular: Normal rate and regular rhythm.   Pulmonary/Chest: Effort normal and breath sounds normal. No respiratory distress. He has no wheezes. He has no rales.  Lymphadenopathy:    He has no cervical adenopathy.          Assessment & Plan:  Cough. Suspect acute viral bronchitis. Tessalon Perles 200 mg every 8 hours as needed for cough. Hycodan cough syrup 1 teaspoon daily at bedtime when necessary for severe cough. Follow-up for  fever or any worsening or persistent symptoms.

## 2015-02-25 NOTE — Progress Notes (Signed)
Pre visit review using our clinic review tool, if applicable. No additional management support is needed unless otherwise documented below in the visit note. 

## 2015-02-25 NOTE — Patient Instructions (Signed)

## 2015-04-30 ENCOUNTER — Other Ambulatory Visit: Payer: Self-pay | Admitting: Internal Medicine

## 2015-04-30 NOTE — Telephone Encounter (Signed)
Pt last visit 02/25/15 Pt last Rx refill 03/06/13 #90 with 3 refills

## 2015-05-03 NOTE — Telephone Encounter (Signed)
ok 

## 2015-05-04 NOTE — Telephone Encounter (Signed)
Ok to RF? 

## 2015-06-01 ENCOUNTER — Other Ambulatory Visit: Payer: Self-pay | Admitting: Internal Medicine

## 2015-07-11 ENCOUNTER — Other Ambulatory Visit: Payer: Self-pay | Admitting: Internal Medicine

## 2015-07-17 ENCOUNTER — Other Ambulatory Visit: Payer: Self-pay | Admitting: Internal Medicine

## 2015-07-19 ENCOUNTER — Other Ambulatory Visit: Payer: Self-pay | Admitting: *Deleted

## 2015-07-19 MED ORDER — LOSARTAN POTASSIUM 100 MG PO TABS: ORAL_TABLET | ORAL | Status: DC

## 2015-07-23 ENCOUNTER — Other Ambulatory Visit: Payer: BLUE CROSS/BLUE SHIELD

## 2015-07-27 ENCOUNTER — Ambulatory Visit (INDEPENDENT_AMBULATORY_CARE_PROVIDER_SITE_OTHER): Payer: BLUE CROSS/BLUE SHIELD | Admitting: Internal Medicine

## 2015-07-27 ENCOUNTER — Encounter: Payer: Self-pay | Admitting: Internal Medicine

## 2015-07-27 VITALS — BP 120/78 | HR 78 | Temp 99.0°F | Resp 20 | Ht 70.5 in | Wt 257.0 lb

## 2015-07-27 DIAGNOSIS — Z0000 Encounter for general adult medical examination without abnormal findings: Secondary | ICD-10-CM

## 2015-07-27 DIAGNOSIS — I10 Essential (primary) hypertension: Secondary | ICD-10-CM

## 2015-07-27 LAB — POC URINALSYSI DIPSTICK (AUTOMATED)
BILIRUBIN UA: NEGATIVE
Blood, UA: NEGATIVE
GLUCOSE UA: NEGATIVE
Ketones, UA: NEGATIVE
NITRITE UA: NEGATIVE
Protein, UA: NEGATIVE
Spec Grav, UA: 1.01
UROBILINOGEN UA: 0.2
pH, UA: 6.5

## 2015-07-27 LAB — CBC WITH DIFFERENTIAL/PLATELET
BASOS PCT: 0.4 % (ref 0.0–3.0)
Basophils Absolute: 0 10*3/uL (ref 0.0–0.1)
EOS ABS: 0.2 10*3/uL (ref 0.0–0.7)
EOS PCT: 3.5 % (ref 0.0–5.0)
HEMATOCRIT: 37.9 % — AB (ref 39.0–52.0)
HEMOGLOBIN: 12.4 g/dL — AB (ref 13.0–17.0)
LYMPHS PCT: 36.8 % (ref 12.0–46.0)
Lymphs Abs: 1.8 10*3/uL (ref 0.7–4.0)
MCHC: 32.6 g/dL (ref 30.0–36.0)
MCV: 80.2 fl (ref 78.0–100.0)
MONO ABS: 0.6 10*3/uL (ref 0.1–1.0)
Monocytes Relative: 11.2 % (ref 3.0–12.0)
Neutro Abs: 2.4 10*3/uL (ref 1.4–7.7)
Neutrophils Relative %: 48.1 % (ref 43.0–77.0)
Platelets: 232 10*3/uL (ref 150.0–400.0)
RBC: 4.73 Mil/uL (ref 4.22–5.81)
RDW: 16.2 % — AB (ref 11.5–15.5)
WBC: 5 10*3/uL (ref 4.0–10.5)

## 2015-07-27 LAB — HEPATIC FUNCTION PANEL
ALT: 48 U/L (ref 0–53)
AST: 47 U/L — AB (ref 0–37)
Albumin: 4.2 g/dL (ref 3.5–5.2)
Alkaline Phosphatase: 74 U/L (ref 39–117)
BILIRUBIN DIRECT: 0.2 mg/dL (ref 0.0–0.3)
BILIRUBIN TOTAL: 0.6 mg/dL (ref 0.2–1.2)
TOTAL PROTEIN: 6.9 g/dL (ref 6.0–8.3)

## 2015-07-27 LAB — LIPID PANEL
CHOL/HDL RATIO: 4
CHOLESTEROL: 110 mg/dL (ref 0–200)
HDL: 25.4 mg/dL — AB (ref >39.00)
LDL Cholesterol: 64 mg/dL (ref 0–99)
NonHDL: 84.75
TRIGLYCERIDES: 105 mg/dL (ref 0.0–149.0)
VLDL: 21 mg/dL (ref 0.0–40.0)

## 2015-07-27 LAB — BASIC METABOLIC PANEL
BUN: 14 mg/dL (ref 6–23)
CO2: 26 mEq/L (ref 19–32)
CREATININE: 1.1 mg/dL (ref 0.40–1.50)
Calcium: 9.3 mg/dL (ref 8.4–10.5)
Chloride: 98 mEq/L (ref 96–112)
GFR: 71.81 mL/min (ref >60.00)
Glucose, Bld: 103 mg/dL — ABNORMAL HIGH (ref 70–99)
Potassium: 4 mEq/L (ref 3.5–5.1)
Sodium: 131 mEq/L — ABNORMAL LOW (ref 135–145)

## 2015-07-27 LAB — PSA: PSA: 8.33 ng/mL — ABNORMAL HIGH (ref 0.10–4.00)

## 2015-07-27 LAB — TSH: TSH: 1.99 u[IU]/mL (ref 0.35–4.50)

## 2015-07-27 NOTE — Progress Notes (Signed)
Subjective:    Patient ID: Dillon [name], male    DOB: [birth year], 65 y.o.   MRN: [id number]  HPI   Wt Readings from Last 3 Encounters:  07/27/15 257 lb (116.574 kg)  02/25/15 268 lb 12.8 oz (121.927 kg)  06/22/14 273 lb (123.51 kg)   65 year old patient who is seen today for a preventive health examination.  He is doing well.  He has hypertension which has been  well controlled. One year ago he was noted to have mild microcytic anemia felt related to the frequent blood donations to the [employer].  He did have a normal colonoscopy in 2014.  The patient has persistent mild microcytic anemia with mild neutropenia.   Past Medical History  Diagnosis Date  . Hypertension   . ED (erectile dysfunction)   . Elevated liver function tests     Social History   Social History  . Marital Status: Married    Spouse Name: N/A  . Number of Children: N/A  . Years of Education: N/A   Occupational History  . Not on file.   Social History Main Topics  . Smoking status: Former Smoker    Quit date: 05/02/1991  . Smokeless tobacco: Never Used  . Alcohol Use: 2.0 oz/week    4 drink(s) per week  . Drug Use: No  . Sexual Activity: Not on file   Other Topics Concern  . Not on file   Social History Narrative    Past Surgical History  Procedure Laterality Date  . Wrist ganglion excision      Family History  Problem Relation Age of Onset  . Coronary artery disease Brother   . Hyperlipidemia Brother   . Colon cancer Maternal Uncle 78  . Prostate cancer Maternal Uncle     No Known Allergies  Current Outpatient Prescriptions on File Prior to Visit  Medication Sig Dispense Refill  . aspirin 81 MG tablet Take 81 mg by mouth daily.      Marland Kitchen latanoprost (XALATAN) 0.005 % ophthalmic solution Place 1 drop into both eyes at bedtime.      Marland Kitchen losartan (COZAAR) 100 MG tablet TAKE 1 TABLET (100 MG TOTAL) BY MOUTH DAILY. 90 tablet 1  . omeprazole (PRILOSEC) 40 MG capsule TAKE 1 CAPSULE (40 MG  TOTAL) BY MOUTH DAILY. 30 capsule 10  . traZODone (DESYREL) 100 MG tablet TAKE 1 TABLET (100 MG TOTAL) BY MOUTH AT BEDTIME. 90 tablet 1  . VIAGRA 100 MG tablet TAKE 1 TABLET (100 MG TOTAL) BY MOUTH AS NEEDED FOR ERECTILE DYSFUNCTION. 4 tablet 3  . zolpidem (AMBIEN) 10 MG tablet TAKE 1 TABLET BY MOUTH AT BEDTIME AS NEEDED 30 tablet 2   No current facility-administered medications on file prior to visit.    BP 120/78 mmHg  Pulse 78  Temp(Src) 99 F (37.2 C) (Oral)  Resp 20  Ht 5' 10.5" (1.791 m)  Wt 257 lb (116.574 kg)  BMI 36.34 kg/m2  SpO2 98%     Past Medical History  Diagnosis Date  . Hypertension   . ED (erectile dysfunction)   . Elevated liver function tests     Social History   Social History  . Marital Status: Married    Spouse Name: N/A  . Number of Children: N/A  . Years of Education: N/A   Occupational History  . Not on file.   Social History Main Topics  . Smoking status: Former Smoker    Quit date: 05/02/1991  . Smokeless  tobacco: Never Used  . Alcohol Use: 2.0 oz/week    4 drink(s) per week  . Drug Use: No  . Sexual Activity: Not on file   Other Topics Concern  . Not on file   Social History Narrative    Past Surgical History  Procedure Laterality Date  . Wrist ganglion excision      Family History  Problem Relation Age of Onset  . Coronary artery disease Brother   . Hyperlipidemia Brother   . Colon cancer Maternal Uncle 78  . Prostate cancer Maternal Uncle     No Known Allergies  Current Outpatient Prescriptions on File Prior to Visit  Medication Sig Dispense Refill  . aspirin 81 MG tablet Take 81 mg by mouth daily.      Marland Kitchen latanoprost (XALATAN) 0.005 % ophthalmic solution Place 1 drop into both eyes at bedtime.      Marland Kitchen losartan (COZAAR) 100 MG tablet TAKE 1 TABLET (100 MG TOTAL) BY MOUTH DAILY. 90 tablet 1  . omeprazole (PRILOSEC) 40 MG capsule TAKE 1 CAPSULE (40 MG TOTAL) BY MOUTH DAILY. 30 capsule 10  . traZODone (DESYREL) 100  MG tablet TAKE 1 TABLET (100 MG TOTAL) BY MOUTH AT BEDTIME. 90 tablet 1  . VIAGRA 100 MG tablet TAKE 1 TABLET (100 MG TOTAL) BY MOUTH AS NEEDED FOR ERECTILE DYSFUNCTION. 4 tablet 3  . zolpidem (AMBIEN) 10 MG tablet TAKE 1 TABLET BY MOUTH AT BEDTIME AS NEEDED 30 tablet 2   No current facility-administered medications on file prior to visit.    BP 120/78 mmHg  Pulse 78  Temp(Src) 99 F (37.2 C) (Oral)  Resp 20  Ht 5' 10.5" (1.791 m)  Wt 257 lb (116.574 kg)  BMI 36.34 kg/m2  SpO2 98%    Review of Systems  Constitutional: Negative for fever, chills, activity change, appetite change and fatigue.  HENT: Negative for congestion, dental problem, ear pain, hearing loss, mouth sores, rhinorrhea, sinus pressure, sneezing, tinnitus, trouble swallowing and voice change.   Eyes: Negative for photophobia, pain, redness and visual disturbance.  Respiratory: Negative for apnea, cough, choking, chest tightness, shortness of breath and wheezing.   Cardiovascular: Negative for chest pain, palpitations and leg swelling.  Gastrointestinal: Negative for nausea, vomiting, abdominal pain, diarrhea, constipation, blood in stool, abdominal distention, anal bleeding and rectal pain.       Dyspepsia  Genitourinary: Negative for dysuria, urgency, frequency, hematuria, flank pain, decreased urine volume, discharge, penile swelling, scrotal swelling, difficulty urinating, genital sores and testicular pain.  Musculoskeletal: Negative for myalgias, back pain, joint swelling, arthralgias, gait problem, neck pain and neck stiffness.  Skin: Negative for color change, rash and wound.  Neurological: Negative for dizziness, tremors, seizures, syncope, facial asymmetry, speech difficulty, weakness, light-headedness, numbness and headaches.  Hematological: Negative for adenopathy. Does not bruise/bleed easily.  Psychiatric/Behavioral: Negative for suicidal ideas, hallucinations, behavioral problems, confusion, sleep  disturbance, self-injury, dysphoric mood, decreased concentration and agitation. The patient is not nervous/anxious.        Objective:   Physical Exam  Constitutional: He appears well-developed and well-nourished. No distress.  Blood pressure 130/80 Overweight  HENT:  Head: Normocephalic and atraumatic.  Right Ear: External ear normal.  Left Ear: External ear normal.  Nose: Nose normal.  Mouth/Throat: Oropharynx is clear and moist.  Eyes: Conjunctivae and EOM are normal. Pupils are equal, round, and reactive to light. No scleral icterus.  Neck: Normal range of motion. Neck supple. No JVD present. No thyromegaly present.  Cardiovascular: Regular rhythm,  normal heart sounds and intact distal pulses.  Exam reveals no gallop and no friction rub.   No murmur heard. Pulmonary/Chest: Effort normal and breath sounds normal. He exhibits no tenderness.  Abdominal: Soft. Bowel sounds are normal. He exhibits no distension and no mass. There is no tenderness.  Genitourinary: Penis normal. Guaiac negative stool.  Uncircumcised Prostate plus 2 enlarged  Musculoskeletal: Normal range of motion. He exhibits no edema or tenderness.  Lymphadenopathy:    He has no cervical adenopathy.  Neurological: He is alert. He has normal reflexes. No cranial nerve deficit. Coordination normal.  Skin: Skin is warm and dry. No rash noted.  Psychiatric: He has a normal mood and affect. His behavior is normal.          Assessment & Plan:   Preventive health exam.  Patient is a frequent blood donor.  Will defer hepatitis C and HIV testing Obesity Hypertension, controlled History of mild microcytic anemia.  Will review CBC

## 2015-07-27 NOTE — Addendum Note (Signed)
Addended by: Gari Crown D on: 07/27/2015 09:47 AM   Modules accepted: Orders

## 2015-07-27 NOTE — Progress Notes (Signed)
Pre visit review using our clinic review tool, if applicable. No additional management support is needed unless otherwise documented below in the visit note. 

## 2015-07-27 NOTE — Patient Instructions (Signed)

## 2015-07-28 ENCOUNTER — Telehealth: Payer: Self-pay | Admitting: Internal Medicine

## 2015-07-28 DIAGNOSIS — R972 Elevated prostate specific antigen [PSA]: Secondary | ICD-10-CM

## 2015-07-28 NOTE — Telephone Encounter (Signed)
Please call pt at [phone]. Thanks

## 2015-07-28 NOTE — Telephone Encounter (Signed)
Pt states Dr Raliegh Ip called him last night about his lab results and he missed the call.  Pt would like a call back before dr Harless Litten today

## 2015-07-28 NOTE — Telephone Encounter (Signed)
Please schedule urology consultation due to elevated PSA

## 2015-07-29 NOTE — Telephone Encounter (Signed)
Dr.K, spoke to pt. Order for Urology consult done.

## 2015-08-25 DIAGNOSIS — R972 Elevated prostate specific antigen [PSA]: Secondary | ICD-10-CM | POA: Insufficient documentation

## 2015-08-25 DIAGNOSIS — N529 Male erectile dysfunction, unspecified: Secondary | ICD-10-CM | POA: Insufficient documentation

## 2015-09-13 ENCOUNTER — Encounter: Payer: Self-pay | Admitting: Internal Medicine

## 2015-09-13 ENCOUNTER — Ambulatory Visit (INDEPENDENT_AMBULATORY_CARE_PROVIDER_SITE_OTHER): Payer: BLUE CROSS/BLUE SHIELD | Admitting: Internal Medicine

## 2015-09-13 VITALS — BP 130/82 | HR 77 | Temp 98.6°F | Resp 20 | Ht 70.5 in | Wt 245.0 lb

## 2015-09-13 DIAGNOSIS — I10 Essential (primary) hypertension: Secondary | ICD-10-CM

## 2015-09-13 DIAGNOSIS — F528 Other sexual dysfunction not due to a substance or known physiological condition: Secondary | ICD-10-CM | POA: Diagnosis not present

## 2015-09-13 DIAGNOSIS — G4733 Obstructive sleep apnea (adult) (pediatric): Secondary | ICD-10-CM

## 2015-09-13 NOTE — Progress Notes (Signed)
Pre visit review using our clinic review tool, if applicable. No additional management support is needed unless otherwise documented below in the visit note. 

## 2015-09-13 NOTE — Progress Notes (Signed)
Subjective:    Patient ID: Dillon [name], male    DOB: July 10, 1950, 65 y.o.   MRN: [id number]  HPI  65 year old patient who has essential hypertension.  He has been seen by urology recently for elevated PSA.  He has had further PSA analysis and is felt to be low risk.  He is scheduled for urology follow-up He has complaints about OSA.  His ex-wife describes disordered disruptive breathing.  He is a loud snore and does have some occasional daytime sleepiness.  Past Medical History  Diagnosis Date  . Hypertension   . ED (erectile dysfunction)   . Elevated liver function tests      Social History   Social History  . Marital Status: Married    Spouse Name: N/A  . Number of Children: N/A  . Years of Education: N/A   Occupational History  . Not on file.   Social History Main Topics  . Smoking status: Former Smoker    Quit date: 05/02/1991  . Smokeless tobacco: Never Used  . Alcohol Use: 2.0 oz/week    4 drink(s) per week  . Drug Use: No  . Sexual Activity: Not on file   Other Topics Concern  . Not on file   Social History Narrative    Past Surgical History  Procedure Laterality Date  . Wrist ganglion excision      Family History  Problem Relation Age of Onset  . Coronary artery disease Brother   . Hyperlipidemia Brother   . Colon cancer Maternal Uncle 78  . Prostate cancer Maternal Uncle     No Known Allergies  Current Outpatient Prescriptions on File Prior to Visit  Medication Sig Dispense Refill  . aspirin 81 MG tablet Take 81 mg by mouth daily.      Marland Kitchen latanoprost (XALATAN) 0.005 % ophthalmic solution Place 1 drop into both eyes at bedtime.      Marland Kitchen losartan (COZAAR) 100 MG tablet TAKE 1 TABLET (100 MG TOTAL) BY MOUTH DAILY. 90 tablet 1  . omeprazole (PRILOSEC) 40 MG capsule TAKE 1 CAPSULE (40 MG TOTAL) BY MOUTH DAILY. 30 capsule 10  . traZODone (DESYREL) 100 MG tablet TAKE 1 TABLET (100 MG TOTAL) BY MOUTH AT BEDTIME. 90 tablet 1  . VIAGRA 100 MG tablet  TAKE 1 TABLET (100 MG TOTAL) BY MOUTH AS NEEDED FOR ERECTILE DYSFUNCTION. 4 tablet 3  . zolpidem (AMBIEN) 10 MG tablet TAKE 1 TABLET BY MOUTH AT BEDTIME AS NEEDED 30 tablet 2   No current facility-administered medications on file prior to visit.    BP 130/82 mmHg  Pulse 77  Temp(Src) 98.6 F (37 C) (Oral)  Resp 20  Ht 5' 10.5" (1.791 m)  Wt 245 lb (111.131 kg)  BMI 34.65 kg/m2  SpO2 98%     Review of Systems  Constitutional: Negative for fever, chills, appetite change and fatigue.  HENT: Negative for congestion, dental problem, ear pain, hearing loss, sore throat, tinnitus, trouble swallowing and voice change.   Eyes: Negative for pain, discharge and visual disturbance.  Respiratory: Negative for cough, chest tightness, wheezing and stridor.   Cardiovascular: Negative for chest pain, palpitations and leg swelling.  Gastrointestinal: Negative for nausea, vomiting, abdominal pain, diarrhea, constipation, blood in stool and abdominal distention.  Genitourinary: Negative for urgency, hematuria, flank pain, discharge, difficulty urinating and genital sores.  Musculoskeletal: Negative for myalgias, back pain, joint swelling, arthralgias, gait problem and neck stiffness.  Skin: Negative for rash.  Neurological: Negative for dizziness, syncope,  speech difficulty, weakness, numbness and headaches.  Hematological: Negative for adenopathy. Does not bruise/bleed easily.  Psychiatric/Behavioral: Positive for sleep disturbance. Negative for behavioral problems and dysphoric mood. The patient is not nervous/anxious.        Objective:   Physical Exam  HENT:  Very low hanging soft palate with pharyngeal crowding          Assessment & Plan:   Rule out OSA.  Home sleep study will be scheduled Hypertension, stable Elevated PSA  Urology follow-up.  Recheck here as scheduled

## 2015-09-13 NOTE — Patient Instructions (Signed)
Limit your sodium (Salt) intake  Please check your blood pressure on a regular basis.  If it is consistently greater than 150/90, please make an office appointment.    It is important that you exercise regularly, at least 20 minutes 3 to 4 times per week.  If you develop chest pain or shortness of breath seek  medical attention.  Sleep study as discussed

## 2015-10-04 ENCOUNTER — Telehealth: Payer: Self-pay | Admitting: Internal Medicine

## 2015-10-04 MED ORDER — TADALAFIL 20 MG PO TABS: 20.0000 mg | ORAL_TABLET | Freq: Every day | ORAL | Status: DC | PRN

## 2015-10-04 NOTE — Telephone Encounter (Signed)
Dr.K, what dosage and how many to dispense?

## 2015-10-04 NOTE — Telephone Encounter (Signed)
Okay to send Rx for Cialis?

## 2015-10-04 NOTE — Telephone Encounter (Signed)
#  6 refill times 12

## 2015-10-04 NOTE — Telephone Encounter (Signed)
Okay for Cialis refill

## 2015-10-04 NOTE — Telephone Encounter (Signed)
Pt call to say that Dr Raliegh Ip had given him samples of CIALIS and he said it seems to working and is asking if a rx can be called in for him.    Pharmacy [facility]

## 2015-10-04 NOTE — Addendum Note (Signed)
Addended by: Marian Sorrow on: 10/04/2015 05:10 PM   Modules accepted: Orders

## 2015-10-05 NOTE — Telephone Encounter (Signed)
Spoke to pt, told him Rx was sent to the pharmacy. Pt verbalized understanding.

## 2015-10-18 ENCOUNTER — Ambulatory Visit (INDEPENDENT_AMBULATORY_CARE_PROVIDER_SITE_OTHER): Payer: BLUE CROSS/BLUE SHIELD | Admitting: Family Medicine

## 2015-10-18 ENCOUNTER — Encounter: Payer: Self-pay | Admitting: Family Medicine

## 2015-10-18 VITALS — BP 120/90 | HR 73 | Temp 98.7°F | Ht 72.0 in | Wt 243.1 lb

## 2015-10-18 DIAGNOSIS — J209 Acute bronchitis, unspecified: Secondary | ICD-10-CM | POA: Diagnosis not present

## 2015-10-18 DIAGNOSIS — R05 Cough: Secondary | ICD-10-CM

## 2015-10-18 DIAGNOSIS — R059 Cough, unspecified: Secondary | ICD-10-CM

## 2015-10-18 MED ORDER — AZITHROMYCIN 250 MG PO TABS: ORAL_TABLET | ORAL | Status: DC

## 2015-10-18 MED ORDER — HYDROCODONE-HOMATROPINE 5-1.5 MG/5ML PO SYRP: 5.0000 mL | ORAL_SOLUTION | Freq: Three times a day (TID) | ORAL | Status: DC | PRN

## 2015-10-18 MED ORDER — PREDNISONE 10 MG PO TABS: ORAL_TABLET | ORAL | Status: DC

## 2015-10-18 NOTE — Patient Instructions (Signed)
Please take medication as directed and follow up if symptoms do not improve in 3-4 days, worsen, or you develop a fever >101.   Acute Bronchitis Bronchitis is inflammation of the airways that extend from the windpipe into the lungs (bronchi). The inflammation often causes mucus to develop. This leads to a cough, which is the most common symptom of bronchitis.  In acute bronchitis, the condition usually develops suddenly and goes away over time, usually in a couple weeks. Smoking, allergies, and asthma can make bronchitis worse. Repeated episodes of bronchitis may cause further lung problems.  CAUSES Acute bronchitis is most often caused by the same virus that causes a cold. The virus can spread from person to person (contagious) through coughing, sneezing, and touching contaminated objects. SIGNS AND SYMPTOMS   Cough.   Fever.   Coughing up mucus.   Body aches.   Chest congestion.   Chills.   Shortness of breath.   Sore throat.  DIAGNOSIS  Acute bronchitis is usually diagnosed through a physical exam. Your health care provider will also ask you questions about your medical history. Tests, such as chest X-rays, are sometimes done to rule out other conditions.  TREATMENT  Acute bronchitis usually goes away in a couple weeks. Oftentimes, no medical treatment is necessary. Medicines are sometimes given for relief of fever or cough. Antibiotic medicines are usually not needed but may be prescribed in certain situations. In some cases, an inhaler may be recommended to help reduce shortness of breath and control the cough. A cool mist vaporizer may also be used to help thin bronchial secretions and make it easier to clear the chest.  HOME CARE INSTRUCTIONS  Get plenty of rest.   Drink enough fluids to keep your urine clear or pale yellow (unless you have a medical condition that requires fluid restriction). Increasing fluids may help thin your respiratory secretions (sputum) and  reduce chest congestion, and it will prevent dehydration.   Take medicines only as directed by your health care provider.  If you were prescribed an antibiotic medicine, finish it all even if you start to feel better.  Avoid smoking and secondhand smoke. Exposure to cigarette smoke or irritating chemicals will make bronchitis worse. If you are a smoker, consider using nicotine gum or skin patches to help control withdrawal symptoms. Quitting smoking will help your lungs heal faster.   Reduce the chances of another bout of acute bronchitis by washing your hands frequently, avoiding people with cold symptoms, and trying not to touch your hands to your mouth, nose, or eyes.   Keep all follow-up visits as directed by your health care provider.  SEEK MEDICAL CARE IF: Your symptoms do not improve after 1 week of treatment.  SEEK IMMEDIATE MEDICAL CARE IF:  You develop an increased fever or chills.   You have chest pain.   You have severe shortness of breath.  You have bloody sputum.   You develop dehydration.  You faint or repeatedly feel like you are going to pass out.  You develop repeated vomiting.  You develop a severe headache. MAKE SURE YOU:   Understand these instructions.  Will watch your condition.  Will get help right away if you are not doing well or get worse.   This information is not intended to replace advice given to you by your health care provider. Make sure you discuss any questions you have with your health care provider.   Document Released: 05/25/2004 Document Revised: 05/08/2014 Document Reviewed: 10/08/2012 Elsevier  Interactive Patient Education 2016 [employer] Inc[employer]  

## 2015-10-18 NOTE — Progress Notes (Signed)
Subjective:    Patient ID: Dillon [name], male    DOB: 09-04-1950, 65 y.o.   MRN: [id number]  HPI ,  Dillon [name] is a 65 year old male who presents today with a nonproductive cough for 4 days.  He reports that this cough wakes him up at night and he reports coughing spells which are difficult to stop. Associated symptoms of rhinitis, post nasal drip, and decreased appetite for over 1 week "possibly closer to 2 weeks"  have been present prior to coughing episodes. He denies sinus pressure/pain, tooth pain, ear pain, GERD SOB, dyspnea, wheezing, chest pain, and palpitations. He does report chills and fever during this time but does not note this today.  He denies allergies, recent sick contact exposure, or recent antibiotic use.   Treatment at home includes Robitussin DM and halls cough drops which have not provided benefit.  He does not report a history of COPD or emphysema. He was seen for acute bronchitis in 01/2015. He is an ex-smoker.    Review of Systems  Constitutional: Positive for chills and appetite change. Negative for fever.  HENT: Positive for postnasal drip and rhinorrhea. Negative for congestion, sinus pressure, sneezing and sore throat.   Eyes: Negative for visual disturbance.  Respiratory: Positive for cough. Negative for shortness of breath and wheezing.   Cardiovascular: Negative for chest pain, palpitations and leg swelling.  Gastrointestinal: Negative for nausea, vomiting, abdominal pain and diarrhea.  Musculoskeletal: Negative for myalgias.  Skin: Negative for rash.  Neurological: Negative for dizziness, light-headedness and headaches.   Past Medical History  Diagnosis Date  . Hypertension   . ED (erectile dysfunction)   . Elevated liver function tests      Social History   Social History  . Marital Status: Married    Spouse Name: N/A  . Number of Children: N/A  . Years of Education: N/A   Occupational History  . Not on file.   Social History Main Topics    . Smoking status: Former Smoker    Quit date: 05/02/1991  . Smokeless tobacco: Never Used  . Alcohol Use: 2.0 oz/week    4 drink(s) per week  . Drug Use: No  . Sexual Activity: Not on file   Other Topics Concern  . Not on file   Social History Narrative    Past Surgical History  Procedure Laterality Date  . Wrist ganglion excision      Family History  Problem Relation Age of Onset  . Coronary artery disease Brother   . Hyperlipidemia Brother   . Colon cancer Maternal Uncle 78  . Prostate cancer Maternal Uncle     No Known Allergies  Current Outpatient Prescriptions on File Prior to Visit  Medication Sig Dispense Refill  . aspirin 81 MG tablet Take 81 mg by mouth daily.      Marland Kitchen latanoprost (XALATAN) 0.005 % ophthalmic solution Place 1 drop into both eyes at bedtime.      Marland Kitchen losartan (COZAAR) 100 MG tablet TAKE 1 TABLET (100 MG TOTAL) BY MOUTH DAILY. 90 tablet 1  . omeprazole (PRILOSEC) 40 MG capsule TAKE 1 CAPSULE (40 MG TOTAL) BY MOUTH DAILY. 30 capsule 10  . tadalafil (CIALIS) 20 MG tablet Take 1 tablet (20 mg total) by mouth daily as needed for erectile dysfunction. 6 tablet 12  . traZODone (DESYREL) 100 MG tablet TAKE 1 TABLET (100 MG TOTAL) BY MOUTH AT BEDTIME. 90 tablet 1  . VIAGRA 100 MG tablet TAKE 1  TABLET (100 MG TOTAL) BY MOUTH AS NEEDED FOR ERECTILE DYSFUNCTION. 4 tablet 3  . zolpidem (AMBIEN) 10 MG tablet TAKE 1 TABLET BY MOUTH AT BEDTIME AS NEEDED 30 tablet 2   No current facility-administered medications on file prior to visit.    BP 120/90 mmHg  Pulse 73  Temp(Src) 98.7 F (37.1 C)  Ht 6' (1.829 m)  Wt 243 lb 1.6 oz (110.269 kg)  BMI 32.96 kg/m2  SpO2 94%      Objective:   Physical Exam  Constitutional: He is oriented to person, place, and time. He appears well-developed and well-nourished.  HENT:  Right Ear: Tympanic membrane normal.  Left Ear: Tympanic membrane normal.  Nose: Rhinorrhea present. Right sinus exhibits no maxillary sinus  tenderness and no frontal sinus tenderness. Left sinus exhibits no maxillary sinus tenderness and no frontal sinus tenderness.  Mouth/Throat: Mucous membranes are normal. No oropharyngeal exudate or posterior oropharyngeal erythema.  Eyes: Pupils are equal, round, and reactive to light. No scleral icterus.  Neck: Neck supple.  Cardiovascular: Normal rate and regular rhythm.   Pulmonary/Chest: Effort normal.  Minimal scattered wheezing noted bilaterally.  Abdominal: Soft. Bowel sounds are normal.  Lymphadenopathy:    He has cervical adenopathy.  Neurological: He is alert and oriented to person, place, and time.  Skin: Skin is warm and dry. No rash noted.        Assessment & Plan:  1. Acute bronchitis, unspecified organism Suspect acute viral bronchitis. Discussed with patient that this is most likely viral in origin and would not benefit from antibiotic therapy. Further discussed the risks associated with antibiotic use. Patient is concerned that this cough is gradually worsening and reports that symptoms are not improving with first symptoms appearing over one week ago and possibly close to 2 weeks ago. A written prescription was provided to patient with the advice to treat symptoms of cough with regimen below and supportive measure for 48-72 hours and if symptoms do not improve or worsen then patient will fill prescription below for use. Further advised patient to follow up with PCP for scheduled care as recommended.  - azithromycin (ZITHROMAX) 250 MG tablet; Take 2 tablets by mouth today, then take 1 tablet daily for 4 days.  Dispense: 6 tablet; Refill: 0  2. Cough  - HYDROcodone-homatropine (HYCODAN) 5-1.5 MG/5ML syrup; Take 5 mLs by mouth every 8 (eight) hours as needed for cough.  Dispense: 120 mL; Refill: 0 - predniSONE (DELTASONE) 10 MG tablet; Take 4 tablets by mouth once daily for 2 days, 3 tablets once daily for 2 days, 2 tablets once daily for 2 days, and one tablet daily for 2  days.  Dispense: 20 tablet; Refill: 0  Advised patient on supportive measures:  Get rest, drink plenty of fluids, and use tylenol for fever if needed. Follow up  if fever >101, if symptoms worsen or if symptoms are not improved in 3 -4 days. Patient verbalizes understanding and agreed with plan.  Delano Metz, FNP-C

## 2015-11-05 ENCOUNTER — Ambulatory Visit: Payer: BLUE CROSS/BLUE SHIELD | Admitting: Internal Medicine

## 2015-11-25 ENCOUNTER — Other Ambulatory Visit: Payer: Self-pay | Admitting: Internal Medicine

## 2015-11-25 NOTE — Telephone Encounter (Signed)
Rx refill sent to pharmacy. 

## 2015-11-29 ENCOUNTER — Encounter: Payer: Self-pay | Admitting: Internal Medicine

## 2015-11-29 ENCOUNTER — Ambulatory Visit (INDEPENDENT_AMBULATORY_CARE_PROVIDER_SITE_OTHER): Payer: BLUE CROSS/BLUE SHIELD | Admitting: Internal Medicine

## 2015-11-29 VITALS — BP 108/60 | HR 84 | Wt 250.2 lb

## 2015-11-29 DIAGNOSIS — R05 Cough: Secondary | ICD-10-CM

## 2015-11-29 DIAGNOSIS — R059 Cough, unspecified: Secondary | ICD-10-CM

## 2015-11-29 DIAGNOSIS — I10 Essential (primary) hypertension: Secondary | ICD-10-CM

## 2015-11-29 NOTE — Progress Notes (Signed)
Subjective:    Patient ID: Dillon [name], male    DOB: 23-Jun-1950, 65 y.o.   MRN: [id number]  HPI  BP Readings from Last 3 Encounters:  11/29/15 108/60  10/18/15 120/90  09/13/15 3/41   65 year old patient with treated hypertension who is seen last month and treated for acute bronchitis.  He was treated with azithromycin and a prednisone Dosepak.  He still has some mild residual cough that is nonproductive.  No nocturnal coughing.  Cough is nonproductive and he otherwise feels well.  Some family members were concerned about persistent cough  Past Medical History:  Diagnosis Date  . ED (erectile dysfunction)   . Elevated liver function tests   . Hypertension      Social History   Social History  . Marital status: Married    Spouse name: N/A  . Number of children: N/A  . Years of education: N/A   Occupational History  . Not on file.   Social History Main Topics  . Smoking status: Former Smoker    Quit date: 05/02/1991  . Smokeless tobacco: Never Used  . Alcohol use 2.0 oz/week    4 drink(s) per week  . Drug use: No  . Sexual activity: Not on file   Other Topics Concern  . Not on file   Social History Narrative  . No narrative on file    Past Surgical History:  Procedure Laterality Date  . WRIST GANGLION EXCISION      Family History  Problem Relation Age of Onset  . Coronary artery disease Brother   . Hyperlipidemia Brother   . Colon cancer Maternal Uncle 78  . Prostate cancer Maternal Uncle     No Known Allergies  Current Outpatient Prescriptions on File Prior to Visit  Medication Sig Dispense Refill  . aspirin 81 MG tablet Take 81 mg by mouth daily.      Marland Kitchen azithromycin (ZITHROMAX) 250 MG tablet Take 2 tablets by mouth today, then take 1 tablet daily for 4 days. 6 tablet 0  . HYDROcodone-homatropine (HYCODAN) 5-1.5 MG/5ML syrup Take 5 mLs by mouth every 8 (eight) hours as needed for cough. 120 mL 0  . latanoprost (XALATAN) 0.005 % ophthalmic  solution Place 1 drop into both eyes at bedtime.      Marland Kitchen losartan (COZAAR) 100 MG tablet TAKE 1 TABLET (100 MG TOTAL) BY MOUTH DAILY. 90 tablet 1  . omeprazole (PRILOSEC) 40 MG capsule TAKE 1 CAPSULE (40 MG TOTAL) BY MOUTH DAILY. 30 capsule 4  . predniSONE (DELTASONE) 10 MG tablet Take 4 tablets by mouth once daily for 2 days, 3 tablets once daily for 2 days, 2 tablets once daily for 2 days, and one tablet daily for 2 days. 20 tablet 0  . tadalafil (CIALIS) 20 MG tablet Take 1 tablet (20 mg total) by mouth daily as needed for erectile dysfunction. 6 tablet 12  . traZODone (DESYREL) 100 MG tablet TAKE 1 TABLET (100 MG TOTAL) BY MOUTH AT BEDTIME. 90 tablet 1  . VIAGRA 100 MG tablet TAKE 1 TABLET (100 MG TOTAL) BY MOUTH AS NEEDED FOR ERECTILE DYSFUNCTION. 4 tablet 3  . zolpidem (AMBIEN) 10 MG tablet TAKE 1 TABLET BY MOUTH AT BEDTIME AS NEEDED 30 tablet 2   No current facility-administered medications on file prior to visit.     BP 108/60   Pulse 84   Wt 250 lb 3.2 oz (113.5 kg)   SpO2 97%   BMI 33.93 kg/m  Review of Systems  Constitutional: Negative for appetite change, chills, fatigue and fever.  HENT: Negative for congestion, dental problem, ear pain, hearing loss, postnasal drip, sore throat, tinnitus, trouble swallowing and voice change.   Eyes: Negative for pain, discharge and visual disturbance.  Respiratory: Positive for cough. Negative for chest tightness, wheezing and stridor.   Cardiovascular: Negative for chest pain, palpitations and leg swelling.  Gastrointestinal: Negative for abdominal distention, abdominal pain, blood in stool, constipation, diarrhea, nausea and vomiting.  Genitourinary: Negative for difficulty urinating, discharge, flank pain, genital sores, hematuria and urgency.  Musculoskeletal: Negative for arthralgias, back pain, gait problem, joint swelling, myalgias and neck stiffness.  Skin: Negative for rash.  Neurological: Negative for dizziness, syncope,  speech difficulty, weakness, numbness and headaches.  Hematological: Negative for adenopathy. Does not bruise/bleed easily.  Psychiatric/Behavioral: Negative for behavioral problems and dysphoric mood. The patient is not nervous/anxious.        Objective:   Physical Exam  Constitutional: He is oriented to person, place, and time. He appears well-developed and well-nourished. No distress.  Blood pressure 118/70  HENT:  Head: Normocephalic.  Right Ear: External ear normal.  Left Ear: External ear normal.  Eyes: Conjunctivae and EOM are normal.  Neck: Normal range of motion.  Cardiovascular: Normal rate and normal heart sounds.   Pulmonary/Chest: Breath sounds normal. No respiratory distress. He has no wheezes. He has no rales. He exhibits no tenderness.  Abdominal: Bowel sounds are normal.  Musculoskeletal: Normal range of motion. He exhibits no edema or tenderness.  Neurological: He is alert and oriented to person, place, and time.  Psychiatric: He has a normal mood and affect. His behavior is normal.          Assessment & Plan:   , mild resolving URI with persistent cough.  Patient reassured  no further treatment necessary   essential hypertension, well-controlled.  No change in therapy   annual CPX as scheduled  Nyoka Cowden, MD

## 2015-11-29 NOTE — Patient Instructions (Signed)
Acute bronchitis symptoms for less than 10 days are generally not helped by antibiotics.  Take over-the-counter expectorants and cough medications such as  Mucinex DM.  Call if there is no improvement in 5 to 7 days or if  you develop worsening cough, fever, or new symptoms, such as shortness of breath or chest pain.  Call or return to clinic prn if these symptoms worsen or fail to improve as anticipated.  

## 2015-11-30 ENCOUNTER — Ambulatory Visit (INDEPENDENT_AMBULATORY_CARE_PROVIDER_SITE_OTHER): Payer: BLUE CROSS/BLUE SHIELD | Admitting: Pulmonary Disease

## 2015-11-30 ENCOUNTER — Encounter: Payer: Self-pay | Admitting: Pulmonary Disease

## 2015-11-30 VITALS — BP 108/70 | HR 91 | Ht 72.0 in | Wt 253.0 lb

## 2015-11-30 DIAGNOSIS — G4733 Obstructive sleep apnea (adult) (pediatric): Secondary | ICD-10-CM | POA: Diagnosis not present
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
## 2015-11-30 NOTE — Patient Instructions (Signed)
Home sleep study 

## 2015-11-30 NOTE — Assessment & Plan Note (Signed)
Given excessive daytime somnolence, narrow pharyngeal exam, witnessed apneas & loud snoring, obstructive sleep apnea is very likely & an overnight polysomnogram will be scheduled as a home study. The pathophysiology of obstructive sleep apnea , it's cardiovascular consequences & modes of treatment including CPAP were discused with the patient in detail & they evidenced understanding.  Pretest probability is intermediate, cardiovascular profile is good -would only treat if moderate or severe

## 2015-12-07 DIAGNOSIS — G4733 Obstructive sleep apnea (adult) (pediatric): Secondary | ICD-10-CM | POA: Diagnosis not present

## 2015-12-09 ENCOUNTER — Telehealth: Payer: Self-pay | Admitting: Pulmonary Disease

## 2015-12-09 DIAGNOSIS — G4733 Obstructive sleep apnea (adult) (pediatric): Secondary | ICD-10-CM

## 2015-12-09 NOTE — Telephone Encounter (Signed)
Per Dr. Elsworth Soho:  HST done 12/07/15 shows that even though patient only slept 2 hours on machine, he still stopped breathing 40/hr during that 2 hour period.  Dr. Elsworth Soho suggests CPAP Titration Study.   --------------------- Attempted to contact patient to discuss results.  Could not leave message, will try to call back.

## 2015-12-10 ENCOUNTER — Other Ambulatory Visit: Payer: Self-pay | Admitting: *Deleted

## 2015-12-10 DIAGNOSIS — G4733 Obstructive sleep apnea (adult) (pediatric): Secondary | ICD-10-CM

## 2015-12-10 NOTE — Telephone Encounter (Signed)
Left message for patient to call back  

## 2015-12-13 NOTE — Telephone Encounter (Signed)
Patient notified of Sleep Study results. Ok with doing Titration testing. Order entered. Nothing further needed.

## 2015-12-13 NOTE — Telephone Encounter (Signed)
Left message for patient to call back  

## 2015-12-19 ENCOUNTER — Ambulatory Visit (HOSPITAL_BASED_OUTPATIENT_CLINIC_OR_DEPARTMENT_OTHER): Payer: BLUE CROSS/BLUE SHIELD | Attending: Pulmonary Disease | Admitting: Pulmonary Disease

## 2015-12-19 VITALS — Ht 72.0 in | Wt 250.0 lb

## 2015-12-19 DIAGNOSIS — G4761 Periodic limb movement disorder: Secondary | ICD-10-CM | POA: Insufficient documentation

## 2015-12-19 DIAGNOSIS — Z79899 Other long term (current) drug therapy: Secondary | ICD-10-CM | POA: Diagnosis not present

## 2015-12-19 DIAGNOSIS — G4733 Obstructive sleep apnea (adult) (pediatric): Secondary | ICD-10-CM | POA: Diagnosis not present

## 2015-12-24 ENCOUNTER — Telehealth: Payer: Self-pay | Admitting: Pulmonary Disease

## 2015-12-24 DIAGNOSIS — G4730 Sleep apnea, unspecified: Secondary | ICD-10-CM | POA: Diagnosis not present

## 2015-12-24 DIAGNOSIS — G4733 Obstructive sleep apnea (adult) (pediatric): Secondary | ICD-10-CM

## 2015-12-24 NOTE — Telephone Encounter (Signed)
Pl send Rx for -  CPAP therapy on 8 cm H2O with a Standard size Fisher&Paykel Nasal Pillow Mask Pilairo Q mask and heated humidification. Download in 4 wks  OV with TP/ me in 6 wks

## 2015-12-24 NOTE — Procedures (Signed)
Patient Name: Dillon [name], Dillon [name] Date: 12/19/2015 Gender: Male D.O.B: 1950-08-11 Age (years): 38 Referring Provider: Kara Mead MD, ABSM Height (inches): 71 Interpreting Physician: Kara Mead MD, ABSM Weight (lbs): 250 RPSGT: Zadie Rhine BMI: 35 MRN: [id number] Neck Size: 18.00  CLINICAL INFORMATION The patient is referred for a CPAP titration to treat sleep apnea.   Date of HST:  12/07/15 shows that even though patient only slept 2 hours on machine, AHI was 40/hr    SLEEP STUDY TECHNIQUE As per the AASM Manual for the Scoring of Sleep and Associated Events v2.3 (April 2016) with a hypopnea requiring 4% desaturations. The channels recorded and monitored were frontal, central and occipital EEG, electrooculogram (EOG), submentalis EMG (chin), nasal and oral airflow, thoracic and abdominal wall motion, anterior tibialis EMG, snore microphone, electrocardiogram, and pulse oximetry. Continuous positive airway pressure (CPAP) was initiated at the beginning of the study and titrated to treat sleep-disordered breathing.   MEDICATIONS Medications taken by the patient : AMBIEN  Medications administered by patient during sleep study : Sleep medicine administered - Ambien 10 mg at 10:00:20 PM   RESPIRATORY PARAMETERS Optimal PAP Pressure (cm): 8 AHI at Optimal Pressure (/hr): 0.0 Overall Minimal O2 (%): 84.00 Supine % at Optimal Pressure (%): 100 Minimal O2 at Optimal Pressure (%): 90.0     SLEEP ARCHITECTURE The study was initiated at 10:52:31 PM and ended at 4:45:05 AM. Sleep onset time was 3.2 minutes and the sleep efficiency was 71.3%. The total sleep time was 251.5 minutes. The patient spent 6.36% of the night in stage N1 sleep, 71.57% in stage N2 sleep, 2.58% in stage N3 and 19.48% in REM.Stage REM latency was 143.5 minutes Wake after sleep onset was 97.8. Alpha intrusion was absent. Supine sleep was 73.31%.   CARDIAC DATA The 2 lead EKG demonstrated sinus rhythm. The mean  heart rate was 70.68 beats per minute. Other EKG findings include: None.   LEG MOVEMENT DATA The total Periodic Limb Movements of Sleep (PLMS) were 936. The PLMS index was 223.30. A PLMS index of <15 is considered normal in adults.   IMPRESSIONS - The optimal PAP pressure was 8 cm of water. - Central sleep apnea was not noted during this titration (CAI = 0.0/h). - Moderate oxygen desaturations were observed during this titration (min O2 = 84.00%). - No snoring was audible during this study. - No cardiac abnormalities were observed during this study. - Severe periodic limb movements were observed during this study. Arousals associated with PLMs were significant.   DIAGNOSIS - Obstructive Sleep Apnea (327.23 [G47.33 ICD-10]) - PLM disorder   RECOMMENDATIONS - Trial of CPAP therapy on 8 cm H2O with a Standard size Fisher&Paykel Nasal Pillow Mask Pil[place] Q mask and heated humidification. - Avoid alcohol, sedatives and other CNS depressants that may worsen sleep apnea and disrupt normal sleep architecture. - Sleep hygiene should be reviewed to assess factors that may improve sleep quality. - Weight management and regular exercise should be initiated or continued. - Return to [facility] for re-evaluation after 4 weeks of therapy    Kara Mead MD. FCCP.  Pulmonary  12/24/2015

## 2015-12-27 NOTE — Telephone Encounter (Signed)
Rx sent in for CPAP machine. Attempted to contact patient to notify of order and to schedule for 6 week follow up, left message to call back.  Just needs to schedule 6 week follow up with TP or RA for CPAP follow up when patient calls back.

## 2016-01-05 NOTE — Telephone Encounter (Signed)
Pt scheduled for appt with TP 02/17/16 at 9am Pt has already received CPAP machine and has been using it the past few nights. Nothing further needed.

## 2016-01-05 NOTE — Telephone Encounter (Signed)
Pt calling back [phone]

## 2016-01-05 NOTE — Telephone Encounter (Signed)
Attempted to contact patient, no answer, will call back. 

## 2016-01-31 ENCOUNTER — Encounter (HOSPITAL_BASED_OUTPATIENT_CLINIC_OR_DEPARTMENT_OTHER): Payer: BLUE CROSS/BLUE SHIELD

## 2016-02-07 ENCOUNTER — Other Ambulatory Visit: Payer: Self-pay | Admitting: Internal Medicine

## 2016-02-17 ENCOUNTER — Ambulatory Visit: Payer: BLUE CROSS/BLUE SHIELD | Admitting: Adult Health

## 2016-02-23 ENCOUNTER — Ambulatory Visit: Payer: BLUE CROSS/BLUE SHIELD | Admitting: Adult Health

## 2016-03-02 ENCOUNTER — Ambulatory Visit: Payer: BLUE CROSS/BLUE SHIELD | Admitting: Adult Health

## 2016-03-08 ENCOUNTER — Ambulatory Visit: Payer: BLUE CROSS/BLUE SHIELD | Admitting: Adult Health

## 2016-03-13 ENCOUNTER — Ambulatory Visit (INDEPENDENT_AMBULATORY_CARE_PROVIDER_SITE_OTHER): Payer: BLUE CROSS/BLUE SHIELD | Admitting: Adult Health

## 2016-03-13 ENCOUNTER — Encounter: Payer: Self-pay | Admitting: Adult Health

## 2016-03-13 VITALS — BP 100/70 | HR 84 | Temp 97.9°F | Ht 72.0 in | Wt 255.0 lb

## 2016-03-13 DIAGNOSIS — G4733 Obstructive sleep apnea (adult) (pediatric): Secondary | ICD-10-CM | POA: Diagnosis not present

## 2016-03-13 NOTE — Assessment & Plan Note (Addendum)
Severe OSA  Encouraged on CPAP compliance with pt education  Change to nasal pillows for comfort  Plan  Patient Instructions  Try to wear CPAP At bedtime  , at least 4-6hr each night .  Do not drive if sleepy .  Follow up in 6 weeks with CPAP download Order to DME for nasal pillows.

## 2016-03-13 NOTE — Patient Instructions (Signed)
Try to wear CPAP At bedtime  , at least 4-6hr each night .  Do not drive if sleepy .  Follow up in 6 weeks with CPAP download Order to DME for nasal pillows.

## 2016-03-13 NOTE — Progress Notes (Signed)
Subjective:    Patient ID: JT HAEFS, male    DOB: 02/13/1951, 65 y.o.   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   TEST  Sleep study 12/19/15 >AHI 40/hr     03/13/2016 Follow up : OSA  Patient presents for 3 month follow up for severe sleep apnea. Patient was seen for sleep consult in August found to have severe obstructive sleep apnea on sleep study. Patient was recommended to start on C Pap at bedtime. Patient says he is doing Download shows poor usage with only 3 days over last 1 month.  Says he has tried it more than that but does not like it. Feels the full face mask does not work for him as he turns over in bed multiple times. We discussed OSA and potential complications.  He denies any chest pain, orthopnea, PND, or increased leg swelling  Past Medical History:  Diagnosis Date  . ED (erectile dysfunction)   . Elevated liver function tests   . Hypertension    Current Outpatient Prescriptions on File Prior to Visit  Medication Sig Dispense Refill  . aspirin 81 MG tablet Take 81 mg by mouth daily.      Marland Kitchen latanoprost (XALATAN) 0.005 % ophthalmic solution Place 1 drop into both eyes at bedtime.      Marland Kitchen losartan (COZAAR) 100 MG tablet TAKE 1 TABLET (100 MG TOTAL) BY MOUTH DAILY. 90 tablet 1  . omeprazole (PRILOSEC) 40 MG capsule TAKE 1 CAPSULE (40 MG TOTAL) BY MOUTH DAILY. 30 capsule 4  . tadalafil (CIALIS) 20 MG tablet Take 1 tablet (20 mg total) by mouth daily as needed for erectile dysfunction. 6 tablet 12  . traZODone (DESYREL) 100 MG tablet TAKE 1 TABLET (100 MG TOTAL) BY MOUTH AT BEDTIME. 90 tablet 1  . zolpidem (AMBIEN) 10 MG tablet TAKE 1 TABLET BY MOUTH AT BEDTIME AS NEEDED 30 tablet 2   No current facility-administered medications on file prior to visit.      Review of Systems    Constitutional:   No  weight loss, night sweats,  Fevers, chills, fatigue, or   lassitude.  HEENT:   No headaches,  Difficulty swallowing,  Tooth/dental problems, or  Sore throat,                No sneezing, itching, ear ache, nasal congestion, post nasal drip,   CV:  No chest pain,  Orthopnea, PND, swelling in lower extremities, anasarca, dizziness, palpitations, syncope.   GI  No heartburn, indigestion, abdominal pain, nausea, vomiting, diarrhea, change in bowel habits, loss of appetite, bloody stools.   Resp: No shortness of breath with exertion or at rest.  No excess mucus, no productive cough,  No non-productive cough,  No coughing up of blood.  No change in color of mucus.  No wheezing.  No chest wall deformity  Skin: no rash or lesions.  GU: no dysuria, change in color of urine, no urgency or frequency.  No flank pain, no hematuria   MS:  No joint pain or swelling.  No decreased range of motion.  No back pain.  Psych:  No change in mood or affect. No depression or anxiety.  No memory loss.      Objective:   Physical Exam  Vitals:   03/13/16 1211  BP: 100/70  Pulse: 84  Temp: 97.9 F (36.6 C)  TempSrc: Oral  SpO2: 98%  Weight: 255  lb (115.7 kg)  Height: 6' (1.829 m)      Body mass index is 34.58 kg/m.  GEN: A/Ox3; pleasant , NAD, well nourished    HEENT:  [place]/AT,  EACs-clear, TMs-wnl, NOSE-clear, THROAT-clear, no lesions, no postnasal drip or exudate noted. Class 2-3 MP airway   NECK:  Supple w/ fair ROM; no JVD; normal carotid impulses w/o bruits; no thyromegaly or nodules palpated; no lymphadenopathy.    RESP  Clear  P & A; w/o, wheezes/ rales/ or rhonchi. no accessory muscle use, no dullness to percussion  CARD:  RRR, no m/r/g  , no peripheral edema, pulses intact, no cyanosis or clubbing.  GI:   Soft & nt; nml bowel sounds; no organomegaly or masses detected.   Musco: Warm bil, no deformities or joint swelling noted.   Neuro: alert, no focal deficits noted.    Skin: Warm, no lesions or rashes  San Lohmeyer NP-C  [facility]  03/13/2016

## 2016-03-15 NOTE — Progress Notes (Signed)
Reviewed & agree with plan  

## 2016-04-17 ENCOUNTER — Other Ambulatory Visit: Payer: Self-pay | Admitting: Internal Medicine

## 2016-04-18 NOTE — Telephone Encounter (Signed)
Rx refilled and faxed over to pharmacy.

## 2016-04-20 ENCOUNTER — Ambulatory Visit (INDEPENDENT_AMBULATORY_CARE_PROVIDER_SITE_OTHER): Payer: BLUE CROSS/BLUE SHIELD | Admitting: Family Medicine

## 2016-04-20 ENCOUNTER — Encounter: Payer: Self-pay | Admitting: Family Medicine

## 2016-04-20 VITALS — BP 136/90 | HR 95 | Temp 98.6°F | Ht 72.0 in | Wt 253.8 lb

## 2016-04-20 DIAGNOSIS — J069 Acute upper respiratory infection, unspecified: Secondary | ICD-10-CM | POA: Diagnosis not present

## 2016-04-20 LAB — POC INFLUENZA A&B (BINAX/QUICKVUE)
INFLUENZA A, POC: NEGATIVE
Influenza B, POC: NEGATIVE

## 2016-04-20 MED ORDER — HYDROCODONE-HOMATROPINE 5-1.5 MG/5ML PO SYRP: 5.0000 mL | ORAL_SOLUTION | Freq: Three times a day (TID) | ORAL | 0 refills | Status: DC | PRN

## 2016-04-20 NOTE — Progress Notes (Signed)
Pre visit review using our clinic review tool, if applicable. No additional management support is needed unless otherwise documented below in the visit note. 

## 2016-04-20 NOTE — Progress Notes (Signed)
HPI:  Acute visit for:  URI: -started: 3-4 days ago -symptoms:nasal congestion, sore throat, cough, low grade temp 99, body aches -denies:fever, SOB, NVD, tooth pain -has tried: nothing -sick contacts/travel/risks: no reported flu, strep or tick exposure - but around grandkids whom have been sick and he wants to check for the flu -he also reports he gets this a few times per year and pcp gives him cough syrup - he wants refill; ambien on list but he reports he takes rarely  ROS: See pertinent positives and negatives per HPI.  Past Medical History:  Diagnosis Date  . ED (erectile dysfunction)   . Elevated liver function tests   . Hypertension     Past Surgical History:  Procedure Laterality Date  . WRIST GANGLION EXCISION      Family History  Problem Relation Age of Onset  . Coronary artery disease Brother   . Hyperlipidemia Brother   . Colon cancer Maternal Uncle 78  . Prostate cancer Maternal Uncle     Social History   Social History  . Marital status: Married    Spouse name: N/A  . Number of children: N/A  . Years of education: N/A   Social History Main Topics  . Smoking status: Former Smoker    Quit date: 05/02/1991  . Smokeless tobacco: Never Used  . Alcohol use No  . Drug use: No  . Sexual activity: Not Asked   Other Topics Concern  . None   Social History Narrative  . None     Current Outpatient Prescriptions:  .  aspirin 81 MG tablet, Take 81 mg by mouth daily.  , Disp: , Rfl:  .  latanoprost (XALATAN) 0.005 % ophthalmic solution, Place 1 drop into both eyes at bedtime.  , Disp: , Rfl:  .  losartan (COZAAR) 100 MG tablet, TAKE 1 TABLET (100 MG TOTAL) BY MOUTH DAILY., Disp: 90 tablet, Rfl: 1 .  omeprazole (PRILOSEC) 40 MG capsule, TAKE 1 CAPSULE (40 MG TOTAL) BY MOUTH DAILY., Disp: 30 capsule, Rfl: 4 .  Sildenafil Citrate (VIAGRA PO), Take by mouth., Disp: , Rfl:  .  traZODone (DESYREL) 100 MG tablet, TAKE 1 TABLET (100 MG TOTAL) BY MOUTH AT  BEDTIME., Disp: 90 tablet, Rfl: 1 .  zolpidem (AMBIEN) 10 MG tablet, TAKE ONE TABLET BY MOUTH DAILY AT BEDTIME AS NEEDED, Disp: 30 tablet, Rfl: 1 .  HYDROcodone-homatropine (HYCODAN) 5-1.5 MG/5ML syrup, Take 5 mLs by mouth every 8 (eight) hours as needed for cough., Disp: 120 mL, Rfl: 0  EXAM:  Vitals:   04/20/16 1517  BP: 136/90  Pulse: 95  Temp: 98.6 F (37 C)    Body mass index is 34.42 kg/m.  GENERAL: vitals reviewed and listed above, alert, oriented, appears well hydrated and in no acute distress  HEENT: atraumatic, conjunttiva clear, no obvious abnormalities on inspection of external nose and ears, normal appearance of ear canals and TMs, clear nasal congestion, mild post oropharyngeal erythema with PND, no tonsillar edema or exudate, no sinus TTP  NECK: no obvious masses on inspection  LUNGS: clear to auscultation bilaterally, no wheezes, rales or rhonchi, good air movement  CV: HRRR, no peripheral edema  MS: moves all extremities without noticeable abnormality  PSYCH: pleasant and cooperative, no obvious depression or anxiety  ASSESSMENT AND PLAN:  Discussed the following assessment and plan:  Acute upper respiratory infection  -given HPI and exam findings today, a serious infection or illness is unlikely. We discussed potential etiologies, with VURI  being most likely, and advised supportive care and monitoring. Flu test neg. Discussed limitations of flu testing and treatment. Hycodan cough medication as he reports is the only thing that helps him get rest when sick - discussed risks and interactions. We discussed treatment side effects, likely course, antibiotic misuse, transmission, and signs of developing a serious illness. -of course, we advised to return or notify a doctor immediately if symptoms worsen or persist or new concerns arise.    Patient Instructions  INSTRUCTIONS FOR UPPER RESPIRATORY INFECTION:  -plenty of rest and fluids  -nasal saline wash 2-3  times daily (use prepackaged nasal saline or bottled/distilled water if making your own)   -can use AFRIN nasal spray for drainage and nasal congestion - but do NOT use longer then 3-4 days  -can use tylenol (in no history of liver disease) or ibuprofen (if no history of kidney disease, bowel bleeding or significant heart disease) as directed for aches and sorethroat  -in the winter time, using a humidifier at night is helpful (please follow cleaning instructions)  -if you are taking a cough medication - use only as directed, may also try a teaspoon of honey to coat the throat and throat lozenges. If given a cough medication with codeine or hydrocodone or other narcotic please be advised that this contains a strong and  potentially addicting medication. Please follow instructions carefully, take as little as possible and only use AS NEEDED for severe cough. Discuss potential side effects with your pharmacy. Please do not drive or operate machinery while taking these types of medications. Please do not take other sedating medications, drugs or alcohol while taking this medication without discussing with your doctor.  -for sore throat, salt water gargles can help  -follow up if you have fevers, facial pain, tooth pain, difficulty breathing or are worsening or symptoms persist longer then expected  Upper Respiratory Infection, Adult An upper respiratory infection (URI) is also known as the common cold. It is often caused by a type of germ (virus). Colds are easily spread (contagious). You can pass it to others by kissing, coughing, sneezing, or drinking out of the same glass. Usually, you get better in 1 to 3  weeks.  However, the cough can last for even longer. HOME CARE   Only take medicine as told by your doctor. Follow instructions provided above.  Drink enough water and fluids to keep your pee (urine) clear or pale yellow.  Get plenty of rest.  Return to work when your temperature is < 100  for 24 hours or as told by your doctor. You may use a face mask and wash your hands to stop your cold from spreading. GET HELP RIGHT AWAY IF:   After the first few days, you feel you are getting worse.  You have questions about your medicine.  You have chills, shortness of breath, or red spit (mucus).  You have pain in the face for more then 1-2 days, especially when you bend forward.  You have a fever, puffy (swollen) neck, pain when you swallow, or white spots in the back of your throat.  You have a bad headache, ear pain, sinus pain, or chest pain.  You have a high-pitched whistling sound when you breathe in and out (wheezing).  You cough up blood.  You have sore muscles or a stiff neck. MAKE SURE YOU:   Understand these instructions.  Will watch your condition.  Will get help right away if you are not doing well  or get worse. Document Released: 10/04/2007 Document Revised: 07/10/2011 Document Reviewed: 07/23/2013 [facility] Patient Information 2015 [place], [place]. This information is not intended to replace advice given to you by your health care provider. Make sure you discuss any questions you have with your health care provider.    Colin Benton R., DO

## 2016-04-20 NOTE — Patient Instructions (Signed)

## 2016-04-20 NOTE — Addendum Note (Signed)
Addended by: Agnes Lawrence on: 04/20/2016 04:01 PM   Modules accepted: Orders

## 2016-04-25 ENCOUNTER — Ambulatory Visit: Payer: BLUE CROSS/BLUE SHIELD | Admitting: Adult Health

## 2016-05-09 ENCOUNTER — Encounter: Payer: Self-pay | Admitting: Adult Health

## 2016-05-09 ENCOUNTER — Ambulatory Visit: Payer: BLUE CROSS/BLUE SHIELD | Admitting: Adult Health

## 2016-05-09 DIAGNOSIS — G4733 Obstructive sleep apnea (adult) (pediatric): Secondary | ICD-10-CM

## 2016-05-09 DIAGNOSIS — E669 Obesity, unspecified: Secondary | ICD-10-CM | POA: Insufficient documentation

## 2016-05-09 DIAGNOSIS — E6609 Other obesity due to excess calories: Secondary | ICD-10-CM | POA: Diagnosis not present

## 2016-05-09 NOTE — Progress Notes (Signed)
@Patient  ID: Dillon [name], male    DOB: 1950-05-19, 66 y.o.   MRN: [id number]  No chief complaint on file.   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   TEST  Sleep study 12/19/15 >AHI 40/hr    05/09/2016 Follow up  ;OSA  Pt returns for 2 month follow up for severe OSA on CPAP.  Changed to nasal pillows last ov to help with comfort/compliace. Says he is doing some better. [name] he had a cold for couple of weeks , was not able to wear CPAP .  When he wears CPAP he feels more rested. We discussed compliance.  Over last 2 weeks more usage.  Download shows 43% compliance , AHI 1.3 , min leaks. AHI 1.3.     No Known Allergies  Immunization History  Administered Date(s) Administered  . Tdap 03/06/2013    Past Medical History:  Diagnosis Date  . ED (erectile dysfunction)   . Elevated liver function tests   . Hypertension     Tobacco History: History  Smoking Status  . Former Smoker  . Quit date: 05/02/1991  Smokeless Tobacco  . Never Used   Counseling given: Not Answered   Outpatient Encounter Prescriptions as of 05/09/2016  Medication Sig  . aspirin 81 MG tablet Take 81 mg by mouth daily.    Marland Kitchen latanoprost (XALATAN) 0.005 % ophthalmic solution Place 1 drop into both eyes at bedtime.    Marland Kitchen losartan (COZAAR) 100 MG tablet TAKE 1 TABLET (100 MG TOTAL) BY MOUTH DAILY.  Marland Kitchen omeprazole (PRILOSEC) 40 MG capsule TAKE 1 CAPSULE (40 MG TOTAL) BY MOUTH DAILY. (Patient taking differently: TAKE 1 CAPSULE (40 MG TOTAL) BY MOUTH DAILY AS NEEDED)  . Sildenafil Citrate (VIAGRA PO) Take by mouth.  . traZODone (DESYREL) 100 MG tablet TAKE 1 TABLET (100 MG TOTAL) BY MOUTH AT BEDTIME.  Marland Kitchen zolpidem (AMBIEN) 10 MG tablet TAKE ONE TABLET BY MOUTH DAILY AT BEDTIME AS NEEDED  . HYDROcodone-homatropine (HYCODAN) 5-1.5 MG/5ML syrup Take 5 mLs by mouth every 8 (eight) hours as  needed for cough. (Patient not taking: Reported on 05/09/2016)   No facility-administered encounter medications on file as of 05/09/2016.      Review of Systems  Constitutional:   No  weight loss, night sweats,  Fevers, chills, fatigue, or  lassitude.  HEENT:   No headaches,  Difficulty swallowing,  Tooth/dental problems, or  Sore throat,                No sneezing, itching, ear ache, nasal congestion, post nasal drip,   CV:  No chest pain,  Orthopnea, PND, swelling in lower extremities, anasarca, dizziness, palpitations, syncope.   GI  No heartburn, indigestion, abdominal pain, nausea, vomiting, diarrhea, change in bowel habits, loss of appetite, bloody stools.   Resp: No shortness of breath with exertion or at rest.  No excess mucus, no productive cough,  No non-productive cough,  No coughing up of blood.  No change in color of mucus.  No wheezing.  No chest wall deformity  Skin: no rash or lesions.  GU: no dysuria, change in color of urine, no urgency or frequency.  No flank pain, no hematuria   MS:  No joint pain or swelling.  No decreased range of motion.  No back pain.    Physical Exam  BP 120/70   Pulse 78   Ht 6' (  1.829 m)   Wt 256 lb 6.4 oz (116.3 kg)   SpO2 95%   BMI 34.77 kg/m   GEN: A/Ox3; pleasant , NAD, well nourished    HEENT:  [place]/AT,  EACs-clear, TMs-wnl, NOSE-clear, THROAT-clear, no lesions, no postnasal drip or exudate noted. Class 3 MP airway   NECK:  Supple w/ fair ROM; no JVD; normal carotid impulses w/o bruits; no thyromegaly or nodules palpated; no lymphadenopathy.    RESP  Clear  P & A; w/o, wheezes/ rales/ or rhonchi. no accessory muscle use, no dullness to percussion  CARD:  RRR, no m/r/g, no peripheral edema, pulses intact, no cyanosis or clubbing.  GI:   Soft & nt; nml bowel sounds; no organomegaly or masses detected.   Musco: Warm bil, no deformities or joint swelling noted.   Neuro: alert, no focal deficits noted.    Skin: Warm, no  lesions or rashes  Psych:  No change in mood or affect. No depression or anxiety.  No memory loss.   Assessment & Plan:   No problem-specific Assessment & Plan notes found for this encounter.     Rexene Edison, NP 05/09/2016

## 2016-05-09 NOTE — Assessment & Plan Note (Signed)
Wt loss  

## 2016-05-09 NOTE — Patient Instructions (Addendum)
Try to wear CPAP At bedtime  , at least 4-6hr each night .  Stay active and work on weight loss.  Do not drive if sleepy .  Follow up in 4 months Dr. Elsworth Soho  and As needed

## 2016-05-09 NOTE — Assessment & Plan Note (Signed)
Severe OSA , needs better compliance  OSA /CPAP teaching   Plan  Patient Instructions  Try to wear CPAP At bedtime  , at least 4-6hr each night .  Stay active and work on weight loss.  Do not drive if sleepy .  Follow up in 4 months and As needed

## 2016-05-11 NOTE — Progress Notes (Signed)
Reviewed & agree with plan  

## 2016-08-03 ENCOUNTER — Other Ambulatory Visit: Payer: BLUE CROSS/BLUE SHIELD

## 2016-08-10 ENCOUNTER — Encounter: Payer: Self-pay | Admitting: Internal Medicine

## 2016-08-10 ENCOUNTER — Ambulatory Visit (INDEPENDENT_AMBULATORY_CARE_PROVIDER_SITE_OTHER): Payer: BLUE CROSS/BLUE SHIELD | Admitting: Internal Medicine

## 2016-08-10 VITALS — BP 132/80 | HR 80 | Temp 98.1°F | Ht 70.0 in | Wt 259.8 lb

## 2016-08-10 DIAGNOSIS — Z0000 Encounter for general adult medical examination without abnormal findings: Secondary | ICD-10-CM

## 2016-08-10 NOTE — Progress Notes (Signed)
Subjective:    Patient ID: Dillon [name], male    DOB: 20-Jun-1950, 66 y.o.   MRN: [id number]  HPI  Wt Readings from Last 3 Encounters:  08/10/16 259 lb 12.8 oz (117.8 kg)  05/09/16 256 lb 6.4 oz (116.3 kg)  04/20/16 253 lb 12.8 oz (20.42 kg)   66 year old patient who is seen today for a preventive health examination.  He has a history of essential hypertension and is followed closely by ophthalmology with a history of glaucoma. He is followed by urology due to an elevated PSA.  He is scheduled for follow-up in the near future. No new concerns or complaints. He has a history of OSA but has not been able to tolerate CPAP.  Past Medical History:  Diagnosis Date  . ED (erectile dysfunction)   . Elevated liver function tests   . Hypertension      Social History   Social History  . Marital status: Married    Spouse name: N/A  . Number of children: N/A  . Years of education: N/A   Occupational History  . Not on file.   Social History Main Topics  . Smoking status: Former Smoker    Quit date: 05/02/1991  . Smokeless tobacco: Never Used  . Alcohol use No  . Drug use: No  . Sexual activity: Not on file   Other Topics Concern  . Not on file   Social History Narrative  . No narrative on file    Past Surgical History:  Procedure Laterality Date  . WRIST GANGLION EXCISION      Family History  Problem Relation Age of Onset  . Coronary artery disease Brother   . Hyperlipidemia Brother   . Colon cancer Maternal Uncle 78  . Prostate cancer Maternal Uncle     No Known Allergies  Current Outpatient Prescriptions on File Prior to Visit  Medication Sig Dispense Refill  . aspirin 81 MG tablet Take 81 mg by mouth daily.      Marland Kitchen latanoprost (XALATAN) 0.005 % ophthalmic solution Place 1 drop into both eyes at bedtime.      Marland Kitchen losartan (COZAAR) 100 MG tablet TAKE 1 TABLET (100 MG TOTAL) BY MOUTH DAILY. 90 tablet 1  . omeprazole (PRILOSEC) 40 MG capsule TAKE 1 CAPSULE (40 MG  TOTAL) BY MOUTH DAILY. (Patient taking differently: TAKE 1 CAPSULE (40 MG TOTAL) BY MOUTH DAILY AS NEEDED) 30 capsule 4  . Sildenafil Citrate (VIAGRA PO) Take by mouth.    Marland Kitchen HYDROcodone-homatropine (HYCODAN) 5-1.5 MG/5ML syrup Take 5 mLs by mouth every 8 (eight) hours as needed for cough. (Patient not taking: Reported on 05/09/2016) 120 mL 0  . traZODone (DESYREL) 100 MG tablet TAKE 1 TABLET (100 MG TOTAL) BY MOUTH AT BEDTIME. (Patient not taking: Reported on 08/10/2016) 90 tablet 1  . zolpidem (AMBIEN) 10 MG tablet TAKE ONE TABLET BY MOUTH DAILY AT BEDTIME AS NEEDED (Patient not taking: Reported on 08/10/2016) 30 tablet 1   No current facility-administered medications on file prior to visit.     BP 132/80 (BP Location: Left Arm, Patient Position: Sitting, Cuff Size: Normal)   Pulse 80   Temp 98.1 F (36.7 C) (Oral)   Ht 5\' 10"  (1.778 m)   Wt 259 lb 12.8 oz (117.8 kg)   SpO2 98%   BMI 37.28 kg/m     Review of Systems  Constitutional: Negative for appetite change, chills, fatigue and fever.  HENT: Negative for congestion, dental problem, ear pain, hearing  loss, sore throat, tinnitus, trouble swallowing and voice change.   Eyes: Negative for pain, discharge and visual disturbance.  Respiratory: Negative for cough, chest tightness, wheezing and stridor.   Cardiovascular: Negative for chest pain, palpitations and leg swelling.  Gastrointestinal: Negative for abdominal distention, abdominal pain, blood in stool, constipation, diarrhea, nausea and vomiting.  Genitourinary: Negative for difficulty urinating, discharge, flank pain, genital sores, hematuria and urgency.  Musculoskeletal: Negative for arthralgias, back pain, gait problem, joint swelling, myalgias and neck stiffness.  Skin: Negative for rash.  Neurological: Negative for dizziness, syncope, speech difficulty, weakness, numbness and headaches.  Hematological: Negative for adenopathy. Does not bruise/bleed easily.    Psychiatric/Behavioral: Negative for behavioral problems and dysphoric mood. The patient is not nervous/anxious.        Objective:   Physical Exam  Constitutional: He appears well-developed and well-nourished.  HENT:  Head: Normocephalic and atraumatic.  Right Ear: External ear normal.  Left Ear: External ear normal.  Nose: Nose normal.  Mouth/Throat: Oropharynx is clear and moist.  Eyes: Conjunctivae and EOM are normal. Pupils are equal, round, and reactive to light. No scleral icterus.  Neck: Normal range of motion. Neck supple. No JVD present. No thyromegaly present.  Cardiovascular: Regular rhythm, normal heart sounds and intact distal pulses.  Exam reveals no gallop and no friction rub.   No murmur heard. Pulmonary/Chest: Effort normal and breath sounds normal. He exhibits no tenderness.  Abdominal: Soft. Bowel sounds are normal. He exhibits no distension and no mass. There is no tenderness.  Genitourinary: Penis normal.  Musculoskeletal: Normal range of motion. He exhibits no edema or tenderness.  Lymphadenopathy:    He has no cervical adenopathy.  Neurological: He is alert. He has normal reflexes. No cranial nerve deficit. Coordination normal.  Skin: Skin is warm and dry. No rash noted.  Psychiatric: He has a normal mood and affect. His behavior is normal.          Assessment & Plan:   Preventive health examination Essential hypertension, well-controlled Obesity.  Weight loss encouraged History of elevated PSA.  Follow-up urology Glaucoma.  Follow-up ophthalmology OSA.  Weight loss encouraged  Continue low-salt diet home blood pressure monitoring Recheck one year or as needed  [employer]

## 2016-08-10 NOTE — Patient Instructions (Signed)
Limit your sodium (Salt) intake  Please check your blood pressure on a regular basis.  If it is consistently greater than 150/90, please make an office appointment.    It is important that you exercise regularly, at least 20 minutes 3 to 4 times per week.  If you develop chest pain or shortness of breath seek  medical attention.  You need to lose weight.  Consider a lower calorie diet and regular exercise.  Return in one year for follow-up  Urology follow-up as scheduled

## 2016-08-10 NOTE — Progress Notes (Signed)
Pre visit review using our clinic review tool, if applicable. No additional management support is needed unless otherwise documented below in the visit note. 

## 2016-08-14 ENCOUNTER — Other Ambulatory Visit (INDEPENDENT_AMBULATORY_CARE_PROVIDER_SITE_OTHER): Payer: BLUE CROSS/BLUE SHIELD

## 2016-08-14 DIAGNOSIS — Z0000 Encounter for general adult medical examination without abnormal findings: Secondary | ICD-10-CM

## 2016-08-14 LAB — POC URINALSYSI DIPSTICK (AUTOMATED)
BILIRUBIN UA: NEGATIVE
Glucose, UA: NEGATIVE
Ketones, UA: NEGATIVE
Leukocytes, UA: NEGATIVE
NITRITE UA: NEGATIVE
PH UA: 6.5 (ref 5.0–8.0)
Protein, UA: NEGATIVE
RBC UA: NEGATIVE
Spec Grav, UA: 1.015 (ref 1.010–1.025)
Urobilinogen, UA: 0.2 E.U./dL

## 2016-08-14 LAB — BASIC METABOLIC PANEL
BUN: 17 mg/dL (ref 6–23)
CHLORIDE: 102 meq/L (ref 96–112)
CO2: 26 meq/L (ref 19–32)
Calcium: 9.3 mg/dL (ref 8.4–10.5)
Creatinine, Ser: 1.17 mg/dL (ref 0.40–1.50)
GFR: 66.65 mL/min (ref >60.00)
GLUCOSE: 117 mg/dL — AB (ref 70–99)
POTASSIUM: 4.4 meq/L (ref 3.5–5.1)
Sodium: 136 mEq/L (ref 135–145)

## 2016-08-14 LAB — CBC WITH DIFFERENTIAL/PLATELET
BASOS PCT: 1.6 % (ref 0.0–3.0)
Basophils Absolute: 0.1 10*3/uL (ref 0.0–0.1)
EOS PCT: 5.5 % — AB (ref 0.0–5.0)
Eosinophils Absolute: 0.3 10*3/uL (ref 0.0–0.7)
HCT: 37.9 % — ABNORMAL LOW (ref 39.0–52.0)
Hemoglobin: 12.3 g/dL — ABNORMAL LOW (ref 13.0–17.0)
LYMPHS ABS: 2.1 10*3/uL (ref 0.7–4.0)
Lymphocytes Relative: 41.3 % (ref 12.0–46.0)
MCHC: 32.4 g/dL (ref 30.0–36.0)
MCV: 79.7 fl (ref 78.0–100.0)
MONO ABS: 0.7 10*3/uL (ref 0.1–1.0)
Monocytes Relative: 13.3 % — ABNORMAL HIGH (ref 3.0–12.0)
NEUTROS PCT: 38.3 % — AB (ref 43.0–77.0)
Neutro Abs: 1.9 10*3/uL (ref 1.4–7.7)
Platelets: 189 10*3/uL (ref 150.0–400.0)
RBC: 4.76 Mil/uL (ref 4.22–5.81)
RDW: 16 % — AB (ref 11.5–15.5)
WBC: 5.1 10*3/uL (ref 4.0–10.5)

## 2016-08-14 LAB — HEPATIC FUNCTION PANEL
ALBUMIN: 4.4 g/dL (ref 3.5–5.2)
ALT: 26 U/L (ref 0–53)
AST: 27 U/L (ref 0–37)
Alkaline Phosphatase: 57 U/L (ref 39–117)
BILIRUBIN TOTAL: 0.7 mg/dL (ref 0.2–1.2)
Bilirubin, Direct: 0.1 mg/dL (ref 0.0–0.3)
Total Protein: 7.1 g/dL (ref 6.0–8.3)

## 2016-08-14 LAB — LIPID PANEL
CHOLESTEROL: 167 mg/dL (ref 0–200)
HDL: 33.1 mg/dL — ABNORMAL LOW (ref >39.00)
LDL CALC: 104 mg/dL — AB (ref 0–99)
NonHDL: 134.36
TRIGLYCERIDES: 152 mg/dL — AB (ref 0.0–149.0)
Total CHOL/HDL Ratio: 5
VLDL: 30.4 mg/dL (ref 0.0–40.0)

## 2016-08-14 LAB — TSH: TSH: 2.82 u[IU]/mL (ref 0.35–4.50)

## 2016-08-14 LAB — PSA: PSA: 2.39 ng/mL (ref 0.10–4.00)

## 2016-08-21 ENCOUNTER — Other Ambulatory Visit: Payer: Self-pay | Admitting: Internal Medicine

## 2016-08-21 NOTE — Telephone Encounter (Signed)
Sent to the pharmacy by e-scribe for 1 year.  Had cpx on 08/10/16 and advised to return in 1 year.

## 2016-09-06 ENCOUNTER — Ambulatory Visit: Payer: Self-pay | Admitting: Adult Health

## 2016-09-22 ENCOUNTER — Encounter: Payer: Self-pay | Admitting: Family Medicine

## 2016-09-22 ENCOUNTER — Ambulatory Visit (INDEPENDENT_AMBULATORY_CARE_PROVIDER_SITE_OTHER): Payer: BLUE CROSS/BLUE SHIELD | Admitting: Family Medicine

## 2016-09-22 VITALS — BP 128/88 | HR 87 | Temp 98.2°F | Ht 70.0 in | Wt 264.0 lb

## 2016-09-22 DIAGNOSIS — H00011 Hordeolum externum right upper eyelid: Secondary | ICD-10-CM | POA: Diagnosis not present

## 2016-09-22 MED ORDER — ERYTHROMYCIN 5 MG/GM OP OINT: TOPICAL_OINTMENT | OPHTHALMIC | 0 refills | Status: DC

## 2016-09-22 NOTE — Progress Notes (Signed)
Subjective:  Dillon [name] is a 66 y.o. year old very pleasant male patient who presents for/with See problem oriented charting ROS- No chest pain or shortness of breath. No headache or blurry vision.  No blurry vision.    Past Medical History-  Patient Active Problem List   Diagnosis Date Noted  . Obesity 05/09/2016  . OSA (obstructive sleep apnea) 11/30/2015  . ERECTILE DYSFUNCTION 05/07/2008  . Essential hypertension 05/16/2007  . GANGLION OF TENDON SHEATH 05/16/2007    Medications- reviewed and updated Current Outpatient Prescriptions  Medication Sig Dispense Refill  . aspirin 81 MG tablet Take 81 mg by mouth daily.      Marland Kitchen latanoprost (XALATAN) 0.005 % ophthalmic solution Place 1 drop into both eyes at bedtime.      Marland Kitchen losartan (COZAAR) 100 MG tablet TAKE 1 TABLET (100 MG TOTAL) BY MOUTH DAILY. 90 tablet 3  . omeprazole (PRILOSEC) 40 MG capsule TAKE 1 CAPSULE (40 MG TOTAL) BY MOUTH DAILY. (Patient taking differently: TAKE 1 CAPSULE (40 MG TOTAL) BY MOUTH DAILY AS NEEDED) 30 capsule 4  . Sildenafil Citrate (VIAGRA PO) Take by mouth.    . traZODone (DESYREL) 100 MG tablet TAKE 1 TABLET (100 MG TOTAL) BY MOUTH AT BEDTIME. 90 tablet 1  . erythromycin ophthalmic ointment Apply small amount to affected area four times a day for 5 to 7 days 3.5 g 0   No current facility-administered medications for this visit.     Objective: BP 128/88 (BP Location: Left Arm, Patient Position: Sitting, Cuff Size: Large)   Pulse 87   Temp 98.2 F (36.8 C) (Oral)   Ht 5\' 10"  (1.778 m)   Wt 264 lb (119.7 kg)   SpO2 98%   BMI 37.88 kg/m  Gen: NAD, resting comfortably Left eye and conjunctiva normal. Right eyeball and conjunctiva normal. On right upper lid there is a erythematous swollen area inner portion- tender to touch CV: RRR no murmurs rubs or gallops Lungs: CTAB no crackles, wheeze, rhonchi Ext: no edema Skin: warm, dry Neuro: vision grossly normal  Assessment/Plan:  Hordeolum externum  of right upper eyelid S: patient noted slightly red area on right upper inner eyelid on Wednesday. Has gotten larger. Started with warm compresses perhaps once maybe twice a day which provides mild pain relief but area is not going away. He has a wedding tomorrow and worried about it getting larger and cosmetic appearance. Also with up to 5/10 pain in the area- aching sensation. He tired to drain this with a needle (or his wife did- I advised strongly against this). Optho is in [place] A/P: we discussed warm compresses and conservative treatment mainstay of treatment. Advised every 2 hour compresses as well as tylenol as needed for pain. He is very worried about wedding tomorrow and asks for antibiotic- I agreed to topical today of erythromycin but discussed with him that I thought the potential benefit of this was low. If he is not doing better in 2 weeks or worsens should see his optho in [place] and I&D would be considered.   Meds ordered this encounter  Medications  . erythromycin ophthalmic ointment    Sig: Apply small amount to affected area four times a day for 5 to 7 days    Dispense:  3.5 g    Refill:  0   Return precautions advised.  Garret Reddish, MD

## 2016-09-22 NOTE — Patient Instructions (Signed)
Biggest thing is to use warm compresses every 2 hours or so. I think this will come to a complete head and drain  Can also use the antibiotic ointment on the area (ok for it to get in the eye but apply to area that is swollen) though I am not confident this is going to help  If this does not respond in another 10 days or continues to worsen- usually we refer to optho to have them drain these  For pain reasonable to take tylenol 650mg  every 6 hours as needed

## 2016-10-07 ENCOUNTER — Other Ambulatory Visit: Payer: Self-pay | Admitting: Internal Medicine

## 2016-12-11 ENCOUNTER — Other Ambulatory Visit: Payer: Self-pay | Admitting: Internal Medicine

## 2017-01-18 ENCOUNTER — Encounter: Payer: Self-pay | Admitting: Internal Medicine

## 2017-06-27 DIAGNOSIS — H2513 Age-related nuclear cataract, bilateral: Secondary | ICD-10-CM | POA: Diagnosis not present

## 2017-06-27 DIAGNOSIS — H31011 Macula scars of posterior pole (postinflammatory) (post-traumatic), right eye: Secondary | ICD-10-CM | POA: Diagnosis not present

## 2017-06-27 DIAGNOSIS — H401131 Primary open-angle glaucoma, bilateral, mild stage: Secondary | ICD-10-CM | POA: Diagnosis not present

## 2017-09-04 ENCOUNTER — Encounter: Payer: Self-pay | Admitting: Internal Medicine

## 2017-09-04 ENCOUNTER — Ambulatory Visit (INDEPENDENT_AMBULATORY_CARE_PROVIDER_SITE_OTHER): Payer: Medicare Other | Admitting: Internal Medicine

## 2017-09-04 VITALS — BP 120/70 | HR 91 | Temp 98.3°F | Ht 72.0 in | Wt 266.0 lb

## 2017-09-04 DIAGNOSIS — R972 Elevated prostate specific antigen [PSA]: Secondary | ICD-10-CM | POA: Diagnosis not present

## 2017-09-04 DIAGNOSIS — I10 Essential (primary) hypertension: Secondary | ICD-10-CM | POA: Diagnosis not present

## 2017-09-04 DIAGNOSIS — Z0000 Encounter for general adult medical examination without abnormal findings: Secondary | ICD-10-CM | POA: Diagnosis not present

## 2017-09-04 LAB — CBC WITH DIFFERENTIAL/PLATELET
BASOS PCT: 2 % (ref 0.0–3.0)
Basophils Absolute: 0.1 10*3/uL (ref 0.0–0.1)
EOS PCT: 5 % (ref 0.0–5.0)
Eosinophils Absolute: 0.3 10*3/uL (ref 0.0–0.7)
HEMATOCRIT: 39.3 % (ref 39.0–52.0)
Hemoglobin: 13.1 g/dL (ref 13.0–17.0)
LYMPHS ABS: 2.2 10*3/uL (ref 0.7–4.0)
LYMPHS PCT: 43.9 % (ref 12.0–46.0)
MCHC: 33.2 g/dL (ref 30.0–36.0)
MCV: 78.2 fl (ref 78.0–100.0)
MONOS PCT: 12.4 % — AB (ref 3.0–12.0)
Monocytes Absolute: 0.6 10*3/uL (ref 0.1–1.0)
NEUTROS ABS: 1.9 10*3/uL (ref 1.4–7.7)
Neutrophils Relative %: 36.7 % — ABNORMAL LOW (ref 43.0–77.0)
PLATELETS: 157 10*3/uL (ref 150.0–400.0)
RBC: 5.03 Mil/uL (ref 4.22–5.81)
RDW: 16.2 % — ABNORMAL HIGH (ref 11.5–15.5)
WBC: 5.1 10*3/uL (ref 4.0–10.5)

## 2017-09-04 LAB — TSH: TSH: 2.33 u[IU]/mL (ref 0.35–4.50)

## 2017-09-04 LAB — LIPID PANEL
CHOL/HDL RATIO: 6
CHOLESTEROL: 171 mg/dL (ref 0–200)
HDL: 29.6 mg/dL — ABNORMAL LOW (ref >39.00)
NONHDL: 141.15
TRIGLYCERIDES: 223 mg/dL — AB (ref 0.0–149.0)
VLDL: 44.6 mg/dL — AB (ref 0.0–40.0)

## 2017-09-04 LAB — COMPREHENSIVE METABOLIC PANEL
ALT: 43 U/L (ref 0–53)
AST: 35 U/L (ref 0–37)
Albumin: 4.4 g/dL (ref 3.5–5.2)
Alkaline Phosphatase: 56 U/L (ref 39–117)
BUN: 17 mg/dL (ref 6–23)
CALCIUM: 9.7 mg/dL (ref 8.4–10.5)
CHLORIDE: 99 meq/L (ref 96–112)
CO2: 25 meq/L (ref 19–32)
Creatinine, Ser: 1.2 mg/dL (ref 0.40–1.50)
GFR: 64.52 mL/min (ref >60.00)
GLUCOSE: 111 mg/dL — AB (ref 70–99)
POTASSIUM: 4.3 meq/L (ref 3.5–5.1)
Sodium: 132 mEq/L — ABNORMAL LOW (ref 135–145)
Total Bilirubin: 0.9 mg/dL (ref 0.2–1.2)
Total Protein: 7.5 g/dL (ref 6.0–8.3)

## 2017-09-04 LAB — LDL CHOLESTEROL, DIRECT: Direct LDL: 112 mg/dL

## 2017-09-04 LAB — PSA: PSA: 1.99 ng/mL (ref 0.10–4.00)

## 2017-09-04 NOTE — Patient Instructions (Addendum)
Limit your sodium (Salt) intake    It is important that you exercise regularly, at least 20 minutes 3 to 4 times per week.  If you develop chest pain or shortness of breath seek  medical attention.  You need to lose weight.  Consider a lower calorie diet and regular exercise.  Neurology follow-up as scheduled  Ophthalmology follow-up as scheduled  Return in one year for follow-up  Health Maintenance, Male A healthy lifestyle and preventive care is important for your health and wellness. Ask your health care provider about what schedule of regular examinations is right for you. What should I know about weight and diet? Eat a Healthy Diet  Eat plenty of vegetables, fruits, whole grains, low-fat dairy products, and lean protein.  Do not eat a lot of foods high in solid fats, added sugars, or salt.  Maintain a Healthy Weight Regular exercise can help you achieve or maintain a healthy weight. You should:  Do at least 150 minutes of exercise each week. The exercise should increase your heart rate and make you sweat (moderate-intensity exercise).  Do strength-training exercises at least twice a week.  Watch Your Levels of Cholesterol and Blood Lipids  Have your blood tested for lipids and cholesterol every 5 years starting at 65 years of age. If you are at high risk for heart disease, you should start having your blood tested when you are 65 years old. You may need to have your cholesterol levels checked more often if: ? Your lipid or cholesterol levels are high. ? You are older than 65 years of age. ? You are at high risk for heart disease.  What should I know about cancer screening? Many types of cancers can be detected early and may often be prevented. Lung Cancer  You should be screened every year for lung cancer if: ? You are a current smoker who has smoked for at least 30 years. ? You are a former smoker who has quit within the past 15 years.  Talk to your health care  provider about your screening options, when you should start screening, and how often you should be screened.  Colorectal Cancer  Routine colorectal cancer screening usually begins at 65 years of age and should be repeated every 5-10 years until you are 65 years old. You may need to be screened more often if early forms of precancerous polyps or small growths are found. Your health care provider may recommend screening at an earlier age if you have risk factors for colon cancer.  Your health care provider may recommend using home test kits to check for hidden blood in the stool.  A small camera at the end of a tube can be used to examine your colon (sigmoidoscopy or colonoscopy). This checks for the earliest forms of colorectal cancer.  Prostate and Testicular Cancer  Depending on your age and overall health, your health care provider may do certain tests to screen for prostate and testicular cancer.  Talk to your health care provider about any symptoms or concerns you have about testicular or prostate cancer.  Skin Cancer  Check your skin from head to toe regularly.  Tell your health care provider about any new moles or changes in moles, especially if: ? There is a change in a mole's size, shape, or color. ? You have a mole that is larger than a pencil eraser.  Always use sunscreen. Apply sunscreen liberally and repeat throughout the day.  Protect yourself by wearing long sleeves,  pants, a wide-brimmed hat, and sunglasses when outside.  What should I know about heart disease, diabetes, and high blood pressure?  If you are 72-52 years of age, have your blood pressure checked every 3-5 years. If you are 49 years of age or older, have your blood pressure checked every year. You should have your blood pressure measured twice-once when you are at a hospital or clinic, and once when you are not at a hospital or clinic. Record the average of the two measurements. To check your blood pressure  when you are not at a hospital or clinic, you can use: ? An automated blood pressure machine at a pharmacy. ? A home blood pressure monitor.  Talk to your health care provider about your target blood pressure.  If you are between 33-9 years old, ask your health care provider if you should take aspirin to prevent heart disease.  Have regular diabetes screenings by checking your fasting blood sugar level. ? If you are at a normal weight and have a low risk for diabetes, have this test once every three years after the age of 62. ? If you are overweight and have a high risk for diabetes, consider being tested at a younger age or more often.  A one-time screening for abdominal aortic aneurysm (AAA) by ultrasound is recommended for men aged 63-75 years who are current or former smokers. What should I know about preventing infection? Hepatitis B If you have a higher risk for hepatitis B, you should be screened for this virus. Talk with your health care provider to find out if you are at risk for hepatitis B infection. Hepatitis C Blood testing is recommended for:  Everyone born from [birth year] through 1963.  Anyone with known risk factors for hepatitis C.  Sexually Transmitted Diseases (STDs)  You should be screened each year for STDs including gonorrhea and chlamydia if: ? You are sexually active and are younger than 65 years of age. ? You are older than 65 years of age and your health care provider tells you that you are at risk for this type of infection. ? Your sexual activity has changed since you were last screened and you are at an increased risk for chlamydia or gonorrhea. Ask your health care provider if you are at risk.  Talk with your health care provider about whether you are at high risk of being infected with HIV. Your health care provider may recommend a prescription medicine to help prevent HIV infection.  What else can I do?  Schedule regular health, dental, and eye  exams.  Stay current with your vaccines (immunizations).  Do not use any tobacco products, such as cigarettes, chewing tobacco, and e-cigarettes. If you need help quitting, ask your health care provider.  Limit alcohol intake to no more than 2 drinks per day. One drink equals 12 ounces of beer, 5 ounces of wine, or 1 ounces of hard liquor.  Do not use street drugs.  Do not share needles.  Ask your health care provider for help if you need support or information about quitting drugs.  Tell your health care provider if you often feel depressed.  Tell your health care provider if you have ever been abused or do not feel safe at home. This information is not intended to replace advice given to you by your health care provider. Make sure you discuss any questions you have with your health care provider. Document Released: 10/14/2007 Document Revised: 12/15/2015 Document Reviewed: 01/19/2015 Elsevier  Interactive Patient Education  [employer].

## 2017-09-04 NOTE — Progress Notes (Signed)
Subjective:    Patient ID: Dillon [name], male    DOB: [birth year], 67 y.o.   MRN: [id number]  HPI 67 year old patient who is seen today for annual follow-up as well as a welcome to Medicare visit He has a history of essential hypertension which has been well controlled.  He is followed closely by ophthalmology with a history of glaucoma and also a prior history of a venous branch occlusion.  Doing quite well today. No concerns or complaints although there has been steady weight gain.  He does have a history of OSA  Past Medical History:  Diagnosis Date  . ED (erectile dysfunction)   . Elevated liver function tests   . Hypertension      Social History   Socioeconomic History  . Marital status: Married    Spouse name: Not on file  . Number of children: Not on file  . Years of education: Not on file  . Highest education level: Not on file  Occupational History  . Not on file  Social Needs  . Financial resource strain: Not on file  . Food insecurity:    Worry: Not on file    Inability: Not on file  . Transportation needs:    Medical: Not on file    Non-medical: Not on file  Tobacco Use  . Smoking status: Former Smoker    Last attempt to quit: 05/02/1991    Years since quitting: 26.3  . Smokeless tobacco: Never Used  Substance and Sexual Activity  . Alcohol use: No    Alcohol/week: 2.0 oz    Types: 4 Standard drinks or equivalent per week  . Drug use: No  . Sexual activity: Not on file  Lifestyle  . Physical activity:    Days per week: Not on file    Minutes per session: Not on file  . Stress: Not on file  Relationships  . Social connections:    Talks on phone: Not on file    Gets together: Not on file    Attends religious service: Not on file    Active member of club or organization: Not on file    Attends meetings of clubs or organizations: Not on file    Relationship status: Not on file  . Intimate partner violence:    Fear of current or ex partner: Not on  file    Emotionally abused: Not on file    Physically abused: Not on file    Forced sexual activity: Not on file  Other Topics Concern  . Not on file  Social History Narrative  . Not on file    Past Surgical History:  Procedure Laterality Date  . WRIST GANGLION EXCISION      Family History  Problem Relation Age of Onset  . Coronary artery disease Brother   . Hyperlipidemia Brother   . Colon cancer Maternal Uncle 78  . Prostate cancer Maternal Uncle     No Known Allergies  Current Outpatient Medications on File Prior to Visit  Medication Sig Dispense Refill  . aspirin 81 MG tablet Take 81 mg by mouth daily.      Marland Kitchen erythromycin ophthalmic ointment Apply small amount to affected area four times a day for 5 to 7 days 3.5 g 0  . latanoprost (XALATAN) 0.005 % ophthalmic solution Place 1 drop into both eyes at bedtime.      Marland Kitchen losartan (COZAAR) 100 MG tablet TAKE 1 TABLET (100 MG TOTAL) BY MOUTH DAILY. 90 tablet  3  . omeprazole (PRILOSEC) 40 MG capsule TAKE 1 CAPSULE (40 MG TOTAL) BY MOUTH DAILY. 30 capsule 4  . Sildenafil Citrate (VIAGRA PO) Take by mouth.    . traZODone (DESYREL) 100 MG tablet TAKE 1 TABLET (100 MG TOTAL) BY MOUTH AT BEDTIME. 90 tablet 1   No current facility-administered medications on file prior to visit.     BP 120/70 (BP Location: Right Arm, Patient Position: Sitting, Cuff Size: Large)   Pulse 91   Temp 98.3 F (36.8 C) (Oral)   Ht 6' (1.829 m)   Wt 266 lb (120.7 kg)   SpO2 96%   BMI 36.08 kg/m    Welcome to [facility] exam  1. Risk factors, based on past  M,S,F history.  Cardiovascular risk factors include hypertension only  2.  Physical activities: [place]ains [place]  3.  Depression/mood: No history of major depression or mood disorder  4.  Hearing: No deficits  5.  ADL's: Independent  6.  Fall risk: Low  7.  Home safety: No problems identified  8.  Height weight, and visual acuity; followed closely by ophthalmology.   There is been steady weight gain over the years.  9.  Counseling: Weight loss more regular exercise all encouraged  10. Lab orders based on risk factors: Laboratory update including PSA will be reviewed  11. Referral : Follow-up to urology and ophthalmology  12. Care plan: Continue efforts at aggressive risk factor modification  13. Cognitive assessment: Alert and appropriate normal affect no cognitive dysfunction  14. Screening: Patient provided with a written and personalized 5-10 year screening schedule in the AVS.    15. Provider List Update: Primary care ophthalmology urology and GI     Review of Systems  Constitutional: Negative for appetite change, chills, fatigue and fever.  HENT: Negative for congestion, dental problem, ear pain, hearing loss, sore throat, tinnitus, trouble swallowing and voice change.   Eyes: Negative for pain, discharge and visual disturbance.  Respiratory: Negative for cough, chest tightness, wheezing and stridor.   Cardiovascular: Negative for chest pain, palpitations and leg swelling.  Gastrointestinal: Negative for abdominal distention, abdominal pain, blood in stool, constipation, diarrhea, nausea and vomiting.  Genitourinary: Negative for difficulty urinating, discharge, flank pain, genital sores, hematuria and urgency.  Musculoskeletal: Negative for arthralgias, back pain, gait problem, joint swelling, myalgias and neck stiffness.  Skin: Negative for rash.  Neurological: Negative for dizziness, syncope, speech difficulty, weakness, numbness and headaches.  Hematological: Negative for adenopathy. Does not bruise/bleed easily.  Psychiatric/Behavioral: Negative for behavioral problems and dysphoric mood. The patient is not nervous/anxious.        Objective:   Physical Exam  Constitutional: He appears well-developed and well-nourished.  HENT:  Head: Normocephalic and atraumatic.  Right Ear: External ear normal.  Left Ear: External ear normal.    Nose: Nose normal.  Mouth/Throat: Oropharynx is clear and moist.  Eyes: Pupils are equal, round, and reactive to light. Conjunctivae and EOM are normal. No scleral icterus.  Neck: Normal range of motion. Neck supple. No JVD present. No thyromegaly present.  Cardiovascular: Regular rhythm, normal heart sounds and intact distal pulses. Exam reveals no gallop and no friction rub.  No murmur heard. Pulmonary/Chest: Effort normal and breath sounds normal. He exhibits no tenderness.  Abdominal: Soft. Bowel sounds are normal. He exhibits no distension and no mass. There is no tenderness.  Genitourinary: Penis normal.  Genitourinary Comments: Uncircumcised  Musculoskeletal: Normal range of motion. He exhibits  no edema or tenderness.  Lymphadenopathy:    He has no cervical adenopathy.  Neurological: He is alert. He has normal reflexes. No cranial nerve deficit. Coordination normal.  Skin: Skin is warm and dry. No rash noted.  Psychiatric: He has a normal mood and affect. His behavior is normal.          Assessment & Plan:   Essential hypertension well-controlled Obesity weight loss encouraged History of OSA Left the Medicare visit  Will review updated lab Weight loss encouraged Follow-up urology  Return annually  [facility]

## 2017-09-05 ENCOUNTER — Encounter: Payer: Self-pay | Admitting: Internal Medicine

## 2017-09-05 DIAGNOSIS — E1169 Type 2 diabetes mellitus with other specified complication: Secondary | ICD-10-CM | POA: Insufficient documentation

## 2017-09-05 DIAGNOSIS — R7302 Impaired glucose tolerance (oral): Secondary | ICD-10-CM | POA: Insufficient documentation

## 2017-09-05 DIAGNOSIS — E119 Type 2 diabetes mellitus without complications: Secondary | ICD-10-CM | POA: Insufficient documentation

## 2017-09-07 ENCOUNTER — Telehealth: Payer: Self-pay | Admitting: Internal Medicine

## 2017-09-07 NOTE — Telephone Encounter (Signed)
Copied from [place] [id number]. Topic: Quick Communication - See Telephone Encounter >> Sep 07, 2017  9:43 AM Cleaster Corin, NT wrote: CRM for notification. See Telephone encounter for: 09/07/17.  Pt. Calling to receive lab results

## 2017-09-07 NOTE — Telephone Encounter (Signed)
Left message to return call 

## 2017-09-07 NOTE — Telephone Encounter (Signed)
I informed pt that Dr.Kwiatkowski is out of the office and will read them when he returns. Pt wanted to know the results to PSA and Hemoglobin. Results given pt is relieved.

## 2017-09-10 ENCOUNTER — Encounter: Payer: Self-pay | Admitting: Internal Medicine

## 2017-09-10 NOTE — Telephone Encounter (Signed)
Please call/notify patient that lab/test/procedure is normal except fasting blood sugar slightly abnormal at 111; cholesterol 171

## 2017-09-11 NOTE — Telephone Encounter (Signed)
Left message to return call 

## 2017-09-12 NOTE — Telephone Encounter (Signed)
Left message to return call. CRM created!

## 2017-09-28 ENCOUNTER — Other Ambulatory Visit: Payer: Self-pay | Admitting: Internal Medicine

## 2017-10-15 DIAGNOSIS — R972 Elevated prostate specific antigen [PSA]: Secondary | ICD-10-CM | POA: Diagnosis not present

## 2017-10-22 DIAGNOSIS — R972 Elevated prostate specific antigen [PSA]: Secondary | ICD-10-CM | POA: Diagnosis not present

## 2017-11-06 ENCOUNTER — Other Ambulatory Visit: Payer: Self-pay | Admitting: Internal Medicine

## 2018-01-01 DIAGNOSIS — L814 Other melanin hyperpigmentation: Secondary | ICD-10-CM | POA: Diagnosis not present

## 2018-01-01 DIAGNOSIS — D224 Melanocytic nevi of scalp and neck: Secondary | ICD-10-CM | POA: Diagnosis not present

## 2018-01-01 DIAGNOSIS — L821 Other seborrheic keratosis: Secondary | ICD-10-CM | POA: Diagnosis not present

## 2018-02-15 ENCOUNTER — Other Ambulatory Visit: Payer: Self-pay | Admitting: Internal Medicine

## 2018-02-15 MED ORDER — LOSARTAN POTASSIUM 100 MG PO TABS: ORAL_TABLET | ORAL | 0 refills | Status: DC

## 2018-02-15 MED ORDER — OMEPRAZOLE 40 MG PO CPDR: 40.0000 mg | DELAYED_RELEASE_CAPSULE | Freq: Every day | ORAL | 0 refills | Status: DC

## 2018-02-15 NOTE — Telephone Encounter (Signed)
Copied from [place] [id number]. Topic: General - Other >> Feb 15, 2018 11:54 AM Janace Aris A wrote: Medication: losartan (COZAAR) 100 MG tablet, omeprazole (PRILOSEC) 40 MG capsule  Has the patient contacted their pharmacy? Yes   Preferred Pharmacy (with phone number or street name):  [place], [place]  [phone] (Phone) [phone] (Fax)    Agent: Please be advised that RX refills may take up to 3 business days. We ask that you follow-up with your pharmacy.

## 2018-02-15 NOTE — Telephone Encounter (Signed)
Requested medication (s) are due for refill today:   Cozaar due refill;  Prilosec being requested too early.  Requested medication (s) are on the active medication list:   Yes  Future visit scheduled:   Yes 05/06/18 with Dr. Yong Channel.   Former pt of [facility].   Not seen [facility] yet.   Last ordered:    Requested Prescriptions  Pending Prescriptions Disp Refills   losartan (COZAAR) 100 MG tablet 90 tablet 2    Sig: TAKE 1 TABLET (100 MG TOTAL) BY MOUTH DAILY.     Cardiovascular:  Angiotensin Receptor Blockers Passed - 02/15/2018 12:21 PM      Passed - Cr in normal range and within 180 days    Creatinine, Ser  Date Value Ref Range Status  09/04/2017 1.20 0.40 - 1.50 mg/dL Final         Passed - K in normal range and within 180 days    Potassium  Date Value Ref Range Status  09/04/2017 4.3 3.5 - 5.1 mEq/L Final         Passed - Patient is not pregnant      Passed - Last BP in normal range    BP Readings from Last 1 Encounters:  09/04/17 120/70         Passed - Valid encounter within last 6 months    Recent Outpatient Visits          5 months ago Essential hypertension   [facility] at [facility], Doretha Sou, MD   1 year ago Hordeolum externum of right upper eyelid   [place] [facility], Brayton Mars, MD   1 year ago Encounter for preventive health examination   Therap[facility], [facility] at [facility], Doretha Sou, MD   1 year ago Acute upper respiratory infection   Therapist, 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     Gastroenterology: Proton Pump Inhibitors Passed - 02/15/2018 12:21 PM      Passed - Valid  encounter within last 12 months    Recent Outpatient Visits          5 months ago Essential hypertension   Therapist, [facility] at [facility], Doretha Sou, MD   1 year ago Hordeolum externum of right upper eyelid   Therapist, music at [facility], Brayton Mars, MD   1 year ago Encounter for preventive health examination   [place] [facility], Doretha Sou, MD   1 year ago Acute upper respiratory infection   Therapist, music at [facility], [place], DO   2 years ago Essential hypertension   Therapist, music at [facility], Doretha Sou, MD      Future Appointments            In 2 months Yong Channel, Brayton Mars, MD [place], [facility]

## 2018-04-23 NOTE — Progress Notes (Signed)
Chief Complaint  Patient presents with  . Cough    since saturday morning. Has had fever since yesterday of over a 100. Not productive. Runny nose started yesterday. Pt is having aches and pains.    HPI: Dillon [name] 67 y.o. come in for acute problem  pcp dr Raliegh Ip retired and to   Est w   Dr. Yong Channel Has had above sx  Onset 4 days into illness   ? Little  Feels achy   Taking typlenol using   otcs coudh med and up at night  Sitting up  No cp soc abut upper pressure  No edema   Tobacco  15 year ago .  Grandchildren  Had flyu vaccine  And no hx of pulm disease . No hx of chf .  ROS: See pertinent positives and negatives per HPI.  Past Medical History:  Diagnosis Date  . ED (erectile dysfunction)   . Elevated liver function tests   . Hypertension     Family History  Problem Relation Age of Onset  . Coronary artery disease Brother   . Hyperlipidemia Brother   . Colon cancer Maternal Uncle 78  . Prostate cancer Maternal Uncle     Social History   Socioeconomic History  . Marital status: Married    Spouse name: Not on file  . Number of children: Not on file  . Years of education: Not on file  . Highest education level: Not on file  Occupational History  . Not on file  Social Needs  . Financial resource strain: Not on file  . Food insecurity:    Worry: Not on file    Inability: Not on file  . Transportation needs:    Medical: Not on file    Non-medical: Not on file  Tobacco Use  . Smoking status: Former Smoker    Last attempt to quit: 05/02/1991    Years since quitting: 27.0  . Smokeless tobacco: Never Used  Substance and Sexual Activity  . Alcohol use: No    Alcohol/week: 4.0 standard drinks    Types: 4 Standard drinks or equivalent per week  . Drug use: No  . Sexual activity: Not on file  Lifestyle  . Physical activity:    Days per week: Not on file    Minutes per session: Not on file  . Stress: Not on file  Relationships  . Social connections:    Talks on  phone: Not on file    Gets together: Not on file    Attends religious service: Not on file    Active member of club or organization: Not on file    Attends meetings of clubs or organizations: Not on file    Relationship status: Not on file  Other Topics Concern  . Not on file  Social History Narrative  . Not on file    Outpatient Medications Prior to Visit  Medication Sig Dispense Refill  . aspirin 81 MG tablet Take 81 mg by mouth daily.      Marland Kitchen erythromycin ophthalmic ointment Apply small amount to affected area four times a day for 5 to 7 days 3.5 g 0  . latanoprost (XALATAN) 0.005 % ophthalmic solution Place 1 drop into both eyes at bedtime.      Marland Kitchen losartan (COZAAR) 100 MG tablet TAKE 1 TABLET (100 MG TOTAL) BY MOUTH DAILY. 90 tablet 0  . omeprazole (PRILOSEC) 40 MG capsule Take 1 capsule (40 mg total) by mouth daily. 90 capsule  0  . Sildenafil Citrate (VIAGRA PO) Take by mouth.    . traZODone (DESYREL) 100 MG tablet TAKE 1 TABLET (100 MG TOTAL) BY MOUTH AT BEDTIME. 90 tablet 1   No facility-administered medications prior to visit.      EXAM:  BP 140/78 (BP Location: Left Arm, Patient Position: Sitting, Cuff Size: Large)   Pulse 95   Temp 99.4 F (37.4 C) (Oral)   Wt 259 lb 9.6 oz (117.8 kg)   SpO2 96%   BMI 35.21 kg/m   Body mass index is 35.21 kg/m. WDWN in NAD  quiet respirations; mildly congested  somewhat hoarse. Non toxic . Looks tired   No resp distress  HEENT: Normocephalic ;atraumatic , Eyes;  PERRL, EOMs  Full, lids and conjunctiva clear,,Ears: no deformities, canals nl, TM landmarks normal, Nose: no deformity or discharge but congested;face minimally tender Mouth : OP clear without lesion or edema . Neck: Supple without adenopathy or masses or bruits Chest:  bs = ? If course sounds left base  CV:  S1-S2 no gallops or murmurs peripheral perfusion is normal Skin :nl perfusion and no acute rashes  poct flu neg BP Readings from Last 3 Encounters:  04/25/18  140/78  09/04/17 120/70  09/22/16 128/88   cxray nad or mild bronchitis  Markings  ASSESSMENT AND PLAN:  Discussed the following assessment and plan:  Respiratory illness with fever - Plan: POC Influenza A&B (Binax test), DG Chest 2 View Still acts flu like and no ar space disease noted    Close observation sx rx  And  Fu if  persistent or progressive or alarm sx   disc  Risk benefit of medication discussed. Cough med prescribed   To use at night if needed  -Patient advised to return or notify health care team  if  new concerns arise.  Patient Instructions   X ray  And flu tests negative   Still could have been low grade flu   Cough med for now  And  Observer  .  Stay hydrated can try  mucinex  Or mucinex dm in day  And hydrocodoen at night for comfort.    Fu  If fever not goine after weekend . Or as needed .      Standley Brooking. Raghad Lorenz M.D.

## 2018-04-25 ENCOUNTER — Ambulatory Visit (INDEPENDENT_AMBULATORY_CARE_PROVIDER_SITE_OTHER): Payer: Medicare Other | Admitting: Internal Medicine

## 2018-04-25 ENCOUNTER — Ambulatory Visit (INDEPENDENT_AMBULATORY_CARE_PROVIDER_SITE_OTHER): Payer: Medicare Other

## 2018-04-25 ENCOUNTER — Encounter: Payer: Self-pay | Admitting: Internal Medicine

## 2018-04-25 VITALS — BP 140/78 | HR 95 | Temp 99.4°F | Wt 259.6 lb

## 2018-04-25 DIAGNOSIS — J989 Respiratory disorder, unspecified: Secondary | ICD-10-CM

## 2018-04-25 DIAGNOSIS — J984 Other disorders of lung: Secondary | ICD-10-CM | POA: Diagnosis not present

## 2018-04-25 DIAGNOSIS — R509 Fever, unspecified: Secondary | ICD-10-CM

## 2018-04-25 LAB — POC INFLUENZA A&B (BINAX/QUICKVUE)
Influenza A, POC: NEGATIVE
Influenza B, POC: NEGATIVE

## 2018-04-25 MED ORDER — HYDROCODONE-HOMATROPINE 5-1.5 MG/5ML PO SYRP: 5.0000 mL | ORAL_SOLUTION | Freq: Four times a day (QID) | ORAL | 0 refills | Status: DC | PRN

## 2018-04-25 NOTE — Patient Instructions (Addendum)
  X ray  And flu tests negative   Still could have been low grade flu   Cough med for now  And  Observer  .  Stay hydrated can try  mucinex  Or mucinex dm in day  And hydrocodoen at night for comfort.    Fu  If fever not goine after weekend . Or as needed .

## 2018-04-28 ENCOUNTER — Emergency Department (HOSPITAL_BASED_OUTPATIENT_CLINIC_OR_DEPARTMENT_OTHER)
Admission: EM | Admit: 2018-04-28 | Discharge: 2018-04-28 | Disposition: A | Discharge status: Home / Self Care | Payer: Medicare Other | Attending: Emergency Medicine | Admitting: Emergency Medicine

## 2018-04-28 ENCOUNTER — Encounter (HOSPITAL_BASED_OUTPATIENT_CLINIC_OR_DEPARTMENT_OTHER): Payer: Self-pay | Admitting: Emergency Medicine

## 2018-04-28 ENCOUNTER — Other Ambulatory Visit: Payer: Self-pay

## 2018-04-28 DIAGNOSIS — Z5321 Procedure and treatment not carried out due to patient leaving prior to being seen by health care provider: Secondary | ICD-10-CM | POA: Insufficient documentation

## 2018-04-28 DIAGNOSIS — R509 Fever, unspecified: Secondary | ICD-10-CM | POA: Diagnosis not present

## 2018-04-28 NOTE — ED Triage Notes (Signed)
Pt states he is having cold like symptoms since last Tuesday not getting better with medication prescribed by PCP. Pt states he is in and out with fever, chills and generalized body ache.

## 2018-04-29 ENCOUNTER — Ambulatory Visit (INDEPENDENT_AMBULATORY_CARE_PROVIDER_SITE_OTHER): Payer: Medicare Other | Admitting: Internal Medicine

## 2018-04-29 VITALS — BP 132/84 | HR 87 | Temp 98.9°F | Wt 259.1 lb

## 2018-04-29 DIAGNOSIS — R509 Fever, unspecified: Secondary | ICD-10-CM

## 2018-04-29 DIAGNOSIS — J989 Respiratory disorder, unspecified: Secondary | ICD-10-CM | POA: Diagnosis not present

## 2018-04-29 LAB — CBC WITH DIFFERENTIAL/PLATELET
Basophils Absolute: 0 10*3/uL (ref 0.0–0.1)
Basophils Relative: 0.3 % (ref 0.0–3.0)
Eosinophils Absolute: 0.1 10*3/uL (ref 0.0–0.7)
Eosinophils Relative: 0.7 % (ref 0.0–5.0)
HCT: 39 % (ref 39.0–52.0)
Hemoglobin: 12.9 g/dL — ABNORMAL LOW (ref 13.0–17.0)
Lymphocytes Relative: 16.3 % (ref 12.0–46.0)
Lymphs Abs: 1.8 10*3/uL (ref 0.7–4.0)
MCHC: 33.1 g/dL (ref 30.0–36.0)
MCV: 81.3 fl (ref 78.0–100.0)
MONO ABS: 1 10*3/uL (ref 0.1–1.0)
Monocytes Relative: 8.9 % (ref 3.0–12.0)
Neutro Abs: 8.2 10*3/uL — ABNORMAL HIGH (ref 1.4–7.7)
Neutrophils Relative %: 73.8 % (ref 43.0–77.0)
Platelets: 144 10*3/uL — ABNORMAL LOW (ref 150.0–400.0)
RBC: 4.79 Mil/uL (ref 4.22–5.81)
RDW: 16.2 % — ABNORMAL HIGH (ref 11.5–15.5)
WBC: 11.1 10*3/uL — ABNORMAL HIGH (ref 4.0–10.5)

## 2018-04-29 MED ORDER — HYDROCODONE-HOMATROPINE 5-1.5 MG/5ML PO SYRP: 5.0000 mL | ORAL_SOLUTION | Freq: Four times a day (QID) | ORAL | 0 refills | Status: DC | PRN

## 2018-04-29 MED ORDER — DOXYCYCLINE HYCLATE 100 MG PO TABS: 100.0000 mg | ORAL_TABLET | Freq: Two times a day (BID) | ORAL | 0 refills | Status: DC

## 2018-04-29 NOTE — Patient Instructions (Addendum)
Treating for  Pneumonia     Today . Get blood count   Expect fever to be  Pretty much  gone in the next  48 hour and if not seek Fu care . Here or   Elsewhere.

## 2018-04-29 NOTE — Progress Notes (Signed)
Chief Complaint  Patient presents with  . Follow-up    Not getting better and fever of 101 yesterday afternoon. Clear mucus when he does have productive cough but not very oftern     HPI: Dillon [name] 67 y.o. come in for   101 fever  Yesterday   Off and on .  Cough about the same.   tird but no hemoptysis pleurisy edema  syncope but still has fever at night  .   No sinus pain   [facility] to [facility] and then ed but wait was too long.   ROS: See pertinent positives and negatives per HPI. No v d or new rash  Past Medical History:  Diagnosis Date  . ED (erectile dysfunction)   . Elevated liver function tests   . Hypertension     Family History  Problem Relation Age of Onset  . Coronary artery disease Brother   . Hyperlipidemia Brother   . Colon cancer Maternal Uncle 78  . Prostate cancer Maternal Uncle     Social History   Socioeconomic History  . Marital status: Married    Spouse name: Not on file  . Number of children: Not on file  . Years of education: Not on file  . Highest education level: Not on file  Occupational History  . Not on file  Social Needs  . Financial resource strain: Not on file  . Food insecurity:    Worry: Not on file    Inability: Not on file  . Transportation needs:    Medical: Not on file    Non-medical: Not on file  Tobacco Use  . Smoking status: Former Smoker    Last attempt to quit: 05/02/1991    Years since quitting: 27.0  . Smokeless tobacco: Never Used  Substance and Sexual Activity  . Alcohol use: No    Alcohol/week: 4.0 standard drinks    Types: 4 Standard drinks or equivalent per week  . Drug use: No  . Sexual activity: Not on file  Lifestyle  . Physical activity:    Days per week: Not on file    Minutes per session: Not on file  . Stress: Not on file  Relationships  . Social connections:    Talks on phone: Not on file    Gets together: Not on file    Attends religious service: Not on file    Active member of club or  organization: Not on file    Attends meetings of clubs or organizations: Not on file    Relationship status: Not on file  Other Topics Concern  . Not on file  Social History Narrative  . Not on file    Outpatient Medications Prior to Visit  Medication Sig Dispense Refill  . aspirin 81 MG tablet Take 81 mg by mouth daily.      Marland Kitchen erythromycin ophthalmic ointment Apply small amount to affected area four times a day for 5 to 7 days 3.5 g 0  . latanoprost (XALATAN) 0.005 % ophthalmic solution Place 1 drop into both eyes at bedtime.      Marland Kitchen losartan (COZAAR) 100 MG tablet TAKE 1 TABLET (100 MG TOTAL) BY MOUTH DAILY. 90 tablet 0  . omeprazole (PRILOSEC) 40 MG capsule Take 1 capsule (40 mg total) by mouth daily. 90 capsule 0  . Sildenafil Citrate (VIAGRA PO) Take by mouth.    . traZODone (DESYREL) 100 MG tablet TAKE 1 TABLET (100 MG TOTAL) BY MOUTH AT BEDTIME.  90 tablet 1  . HYDROcodone-homatropine (HYCODAN) 5-1.5 MG/5ML syrup Take 5 mLs by mouth every 6 (six) hours as needed for cough. At night 120 mL 0   No facility-administered medications prior to visit.      EXAM:  BP 132/84 (BP Location: Right Arm, Patient Position: Sitting, Cuff Size: Large)   Pulse 87   Temp 98.9 F (37.2 C) (Oral)   Wt 259 lb 1.6 oz (117.5 kg)   SpO2 95%   BMI 35.14 kg/m   Body mass index is 35.14 kg/m.  GENERAL: vitals reviewed and listed above, alert, oriented, appears well hydrated and in no acute distressnon toxic  But under the weather  HEENT: atraumatic, conjunctiva  clear, no obvious abnormalities on inspection of external nose and ears tm clear OP : no lesion edema or exudate  NECK: no obvious masses on inspection palpation  LUNGS:? Coarse breath sounds lung field  CV: HRRR, no clubbing cyanosis or  peripheral edema nl cap refill  MS: moves all extremities without noticeable focal  abnormality PSYCH: pleasant and cooperative, no obvious depression or anxiety Lab Results  Component Value Date    WBC 11.1 (H) 04/29/2018   HGB 12.9 (L) 04/29/2018   HCT 39.0 04/29/2018   PLT 144.0 (L) 04/29/2018   GLUCOSE 111 (H) 09/04/2017   CHOL 171 09/04/2017   TRIG 223.0 (H) 09/04/2017   HDL 29.60 (L) 09/04/2017   LDLDIRECT 112.0 09/04/2017   LDLCALC 104 (H) 08/14/2016   ALT 43 09/04/2017   AST 35 09/04/2017   NA 132 (L) 09/04/2017   K 4.3 09/04/2017   CL 99 09/04/2017   CREATININE 1.20 09/04/2017   BUN 17 09/04/2017   CO2 25 09/04/2017   TSH 2.33 09/04/2017   PSA 1.99 09/04/2017   BP Readings from Last 3 Encounters:  04/29/18 132/84  04/28/18 (!) 138/95  04/25/18 140/78    ASSESSMENT AND PLAN:  Discussed the following assessment and plan:  Respiratory illness with fever - Plan: CBC with Differential/Platelet, CBC with Differential/Platelet Persistent  Fever and cough     Presumed flyu like  Last week  7+ days into illness   Concern about clinical pna   Based on exam and context  No other alarm sx seen   docy and cbc today   Expectant management. And fu as planned  -Patient advised to return or notify health care team  if  new concerns arise.  Patient Instructions  Treating for  Pneumonia     Today . Get blood count   Expect fever to be  Pretty much  gone in the next  48 hour and if not seek Fu care . Here or   Elsewhere.        Standley Brooking. Chudney Scheffler M.D.

## 2018-05-05 ENCOUNTER — Encounter: Payer: Self-pay | Admitting: Internal Medicine

## 2018-05-06 ENCOUNTER — Ambulatory Visit (INDEPENDENT_AMBULATORY_CARE_PROVIDER_SITE_OTHER): Payer: Medicare Other

## 2018-05-06 ENCOUNTER — Ambulatory Visit (INDEPENDENT_AMBULATORY_CARE_PROVIDER_SITE_OTHER): Payer: Medicare Other | Admitting: Family Medicine

## 2018-05-06 ENCOUNTER — Encounter: Payer: Self-pay | Admitting: Family Medicine

## 2018-05-06 VITALS — BP 132/82 | HR 96 | Temp 98.1°F | Ht 72.0 in | Wt 253.0 lb

## 2018-05-06 DIAGNOSIS — I10 Essential (primary) hypertension: Secondary | ICD-10-CM

## 2018-05-06 DIAGNOSIS — R059 Cough, unspecified: Secondary | ICD-10-CM

## 2018-05-06 DIAGNOSIS — G4700 Insomnia, unspecified: Secondary | ICD-10-CM | POA: Insufficient documentation

## 2018-05-06 DIAGNOSIS — E785 Hyperlipidemia, unspecified: Secondary | ICD-10-CM | POA: Diagnosis not present

## 2018-05-06 DIAGNOSIS — R972 Elevated prostate specific antigen [PSA]: Secondary | ICD-10-CM | POA: Diagnosis not present

## 2018-05-06 DIAGNOSIS — R05 Cough: Secondary | ICD-10-CM

## 2018-05-06 DIAGNOSIS — K219 Gastro-esophageal reflux disease without esophagitis: Secondary | ICD-10-CM | POA: Diagnosis not present

## 2018-05-06 DIAGNOSIS — J40 Bronchitis, not specified as acute or chronic: Secondary | ICD-10-CM | POA: Diagnosis not present

## 2018-05-06 DIAGNOSIS — Z87891 Personal history of nicotine dependence: Secondary | ICD-10-CM | POA: Insufficient documentation

## 2018-05-06 NOTE — Patient Instructions (Addendum)
Health Maintenance Due  Topic Date Due  . Hepatitis C Screening-with next labs Sep 18, 1950  . HIV Screening-with next labs 05[birth year]13/1967  . PNA vac Low Risk Adult (1 of 2 - PCV13)-need to do at future visit- Recently treated for respiratory illness though 05/12/2017  . INFLUENZA VACCINE-need to do at future visit- Recently treated for respiratory illness though 11/29/2017   Consider shingrix at your pharmacy  Please stop by x-ray before you go  Lets schedule a visit 09/05/2018 or later  [facility] to see you again today!

## 2018-05-06 NOTE — Progress Notes (Signed)
Subjective:  Dillon [name] is a 68 y.o. year old very pleasant male patient who presents for/with See problem oriented charting ROS-patient with cough and congestion.  No fevers reported.  No chest pain or shortness of breath.  Baseline blurry vision in right eye  Past Medical History-  Patient Active Problem List   Diagnosis Date Noted  . Hyperlipidemia, unspecified 05/06/2018    Priority: Medium  . GERD (gastroesophageal reflux disease) 05/06/2018    Priority: Medium  . Former smoker 05/06/2018    Priority: Medium  . Impaired glucose tolerance 09/05/2017    Priority: Medium  . OSA (obstructive sleep apnea) 11/30/2015    Priority: Medium  . Elevated PSA 08/25/2015    Priority: Medium  . Mild stage glaucoma(365.71) 02/12/2011    Priority: Medium  . Venous tributary (branch) occlusion of retina 02/12/2011    Priority: Medium  . Essential hypertension 05/16/2007    Priority: Medium  . Insomnia 05/06/2018    Priority: Low  . ERECTILE DYSFUNCTION 05/07/2008    Priority: Low    Medications- reviewed and updated Current Outpatient Medications  Medication Sig Dispense Refill  . aspirin 81 MG tablet Take 81 mg by mouth daily.      Marland Kitchen doxycycline (VIBRA-TABS) 100 MG tablet Take 1 tablet (100 mg total) by mouth 2 (two) times daily. 14 tablet 0  . erythromycin ophthalmic ointment Apply small amount to affected area four times a day for 5 to 7 days 3.5 g 0  . HYDROcodone-homatropine (HYCODAN) 5-1.5 MG/5ML syrup Take 5 mLs by mouth every 6 (six) hours as needed for cough. At night 120 mL 0  . latanoprost (XALATAN) 0.005 % ophthalmic solution Place 1 drop into both eyes at bedtime.      Marland Kitchen losartan (COZAAR) 100 MG tablet TAKE 1 TABLET (100 MG TOTAL) BY MOUTH DAILY. 90 tablet 0  . omeprazole (PRILOSEC) 40 MG capsule Take 1 capsule (40 mg total) by mouth daily. 90 capsule 0  . Sildenafil Citrate (VIAGRA PO) Take by mouth.    . traZODone (DESYREL) 100 MG tablet TAKE 1 TABLET (100 MG TOTAL) BY  MOUTH AT BEDTIME. 90 tablet 1   Objective: BP 132/82 (BP Location: Left Arm, Patient Position: Sitting, Cuff Size: Large)   Pulse 96   Temp 98.1 F (36.7 C) (Oral)   Ht 6' (1.829 m)   Wt 253 lb (114.8 kg)   SpO2 94%   BMI 34.31 kg/m  Gen: NAD, resting comfortably Tympanic membrane normal, erythematous nasal turbinates with some clear discharge, intermittent cough during visit CV: RRR no murmurs rubs or gallops Lungs: CTAB no crackles, wheeze, rhonchi Abdomen: soft/nontender/nondistended/normal bowel sounds. No rebound or guarding.  Ext: no edema Skin: warm, dry Neuro: grossly normal, moves all extremities  Assessment/Plan:  Other notes: 1.Bad respiratory illness about 2 weeks ago - patient with some persistent symptoms despite being treated with doxycyline- does feel some better. Possible bronchitis on 04/25/18 films- he wants to be sure hasnt progressed by getting another chest x-ray today. Will be performed 2. Consider b12 with next labs given history  3. Former smoker quit 1993- would consider aaa screen at later time  Hyperlipidemia, unspecified S: poorly controlled on no statin.  Patient is on aspirin for primary prevention Lab Results  Component Value Date   CHOL 171 09/04/2017   HDL 29.60 (L) 09/04/2017   LDLCALC 104 (H) 08/14/2016   LDLDIRECT 112.0 09/04/2017   TRIG 223.0 (H) 09/04/2017   CHOLHDL 6 09/04/2017   A/P:  10 year risk of heart attack or stroke over 18%. Patient wants to work on weight/exercise/healthy eating - recheck 09/05/2018 or later  Essential hypertension S: controlled on losartan 100mg  BP Readings from Last 3 Encounters:  04/29/18 132/84  04/28/18 (!) 138/95  04/25/18 140/78  A/P: We discussed blood pressure goal of <140/90. Continue current meds  Insomnia S: Insomnia is reasonably controlled on trazodone 100 mg nightly A/P: stable - continue current rx. Felt overly sedated and had poor memory next memory after [name]  Future Appointments   Date Time Provider [place]  05/06/2018  8:40 AM [facility] DG 1 [facility] None   Sep 05, 2018 or later for next visit-Medicare only so no physical  Lab/Order associations: Cough - Plan: DG Chest 2 View  Essential hypertension  Hyperlipidemia, unspecified hyperlipidemia type  Insomnia, unspecified type  Gastroesophageal reflux disease without esophagitis  Elevated PSA  Return precautions advised.  Garret Reddish, MD

## 2018-05-06 NOTE — Assessment & Plan Note (Signed)
S: Insomnia is reasonably controlled on trazodone 100 mg nightly A/P: stable - continue current rx. Felt overly sedated and had poor memory next memory after [place]

## 2018-05-06 NOTE — Assessment & Plan Note (Signed)
S: controlled on losartan 100mg  BP Readings from Last 3 Encounters:  04/29/18 132/84  04/28/18 (!) 138/95  04/25/18 140/78  A/P: We discussed blood pressure goal of <140/90. Continue current meds

## 2018-05-06 NOTE — Assessment & Plan Note (Signed)
S: poorly controlled on no statin.  Patient is on aspirin for primary prevention Lab Results  Component Value Date   CHOL 171 09/04/2017   HDL 29.60 (L) 09/04/2017   LDLCALC 104 (H) 08/14/2016   LDLDIRECT 112.0 09/04/2017   TRIG 223.0 (H) 09/04/2017   CHOLHDL 6 09/04/2017   A/P: 10 year risk of heart attack or stroke over 18%. Patient wants to work on weight/exercise/healthy eating - recheck 09/05/2018 or later

## 2018-07-01 DIAGNOSIS — H2513 Age-related nuclear cataract, bilateral: Secondary | ICD-10-CM | POA: Diagnosis not present

## 2018-07-01 DIAGNOSIS — H31011 Macula scars of posterior pole (postinflammatory) (post-traumatic), right eye: Secondary | ICD-10-CM | POA: Diagnosis not present

## 2018-07-01 DIAGNOSIS — H401131 Primary open-angle glaucoma, bilateral, mild stage: Secondary | ICD-10-CM | POA: Diagnosis not present

## 2018-07-04 ENCOUNTER — Encounter: Payer: Self-pay | Admitting: Family Medicine

## 2018-07-04 ENCOUNTER — Ambulatory Visit (INDEPENDENT_AMBULATORY_CARE_PROVIDER_SITE_OTHER): Payer: Medicare Other

## 2018-07-04 ENCOUNTER — Ambulatory Visit (INDEPENDENT_AMBULATORY_CARE_PROVIDER_SITE_OTHER): Payer: Medicare Other | Admitting: Family Medicine

## 2018-07-04 VITALS — BP 118/70 | HR 94 | Temp 98.4°F | Ht 72.0 in | Wt 255.4 lb

## 2018-07-04 DIAGNOSIS — K219 Gastro-esophageal reflux disease without esophagitis: Secondary | ICD-10-CM | POA: Diagnosis not present

## 2018-07-04 DIAGNOSIS — R053 Chronic cough: Secondary | ICD-10-CM

## 2018-07-04 DIAGNOSIS — B9689 Other specified bacterial agents as the cause of diseases classified elsewhere: Secondary | ICD-10-CM

## 2018-07-04 DIAGNOSIS — Z87891 Personal history of nicotine dependence: Secondary | ICD-10-CM | POA: Diagnosis not present

## 2018-07-04 DIAGNOSIS — R05 Cough: Secondary | ICD-10-CM

## 2018-07-04 DIAGNOSIS — J329 Chronic sinusitis, unspecified: Secondary | ICD-10-CM

## 2018-07-04 DIAGNOSIS — I10 Essential (primary) hypertension: Secondary | ICD-10-CM

## 2018-07-04 MED ORDER — AMOXICILLIN-POT CLAVULANATE 875-125 MG PO TABS: 1.0000 | ORAL_TABLET | Freq: Two times a day (BID) | ORAL | 0 refills | Status: AC

## 2018-07-04 NOTE — Progress Notes (Signed)
Phone [phone]   Subjective:  KEAGAN BRISLIN is a 68 y.o. year old very pleasant male patient who presents for/with See problem oriented charting ROS-no fever, chills, unintentional weight loss, night sweats.  No hemoptysis  Past Medical History-  Patient Active Problem List   Diagnosis Date Noted  . Hyperlipidemia, unspecified 05/06/2018    Priority: Medium  . GERD (gastroesophageal reflux disease) 05/06/2018    Priority: Medium  . Former smoker 05/06/2018    Priority: Medium  . Impaired glucose tolerance 09/05/2017    Priority: Medium  . OSA (obstructive sleep apnea) 11/30/2015    Priority: Medium  . Elevated PSA 08/25/2015    Priority: Medium  . Mild stage glaucoma(365.71) 02/12/2011    Priority: Medium  . Venous tributary (branch) occlusion of retina 02/12/2011    Priority: Medium  . Essential hypertension 05/16/2007    Priority: Medium  . Insomnia 05/06/2018    Priority: Low  . ERECTILE DYSFUNCTION 05/07/2008    Priority: Low    Medications- reviewed and updated Current Outpatient Medications  Medication Sig Dispense Refill  . aspirin 81 MG tablet Take 81 mg by mouth daily.      Marland Kitchen latanoprost (XALATAN) 0.005 % ophthalmic solution Place 1 drop into both eyes at bedtime.      Marland Kitchen losartan (COZAAR) 100 MG tablet TAKE 1 TABLET (100 MG TOTAL) BY MOUTH DAILY. 90 tablet 0  . omeprazole (PRILOSEC) 40 MG capsule Take 1 capsule (40 mg total) by mouth daily. 90 capsule 0  . Sildenafil Citrate (VIAGRA PO) Take by mouth.    . traZODone (DESYREL) 100 MG tablet TAKE 1 TABLET (100 MG TOTAL) BY MOUTH AT BEDTIME. 90 tablet 1  . amoxicillin-clavulanate (AUGMENTIN) 875-125 MG tablet Take 1 tablet by mouth 2 (two) times daily for 7 days. 14 tablet 0   No current facility-administered medications for this visit.      Objective:  BP 118/70 (BP Location: Left Arm, Patient Position: Sitting, Cuff Size: Large)   Pulse 94   Temp 98.4 F (36.9 C) (Oral)   Ht 6' (1.829 m)   Wt 255 lb  6.1 oz (115.8 kg)   SpO2 96%   BMI 34.64 kg/m  Gen: NAD, resting comfortably, Intermittent cough during visit Pharynx with mild erythema, nasal turbinates erythematous with yellow discharge.  No significant sinus pressure CV: RRR no murmurs rubs or gallops Lungs: CTAB no crackles, wheeze, rhonchi Ext: no edema Skin: warm, dry Neuro: Gait and speech normal    Assessment and Plan     Chronic cough - Plan: DG Chest 2 View  Gastroesophageal reflux disease without esophagitis  Former smoker  Essential hypertension  S: Had a cold around Christmas time. Had x-ray 04/25/18 and possible bronchitis. Continued symptoms despite doxycycline at that time.   He was seen again on 05/06/2018 and did another x-ray which stated "no active cardiopulmonary disease"  He is a former smoker but quit in 1993.   Since that visit he has continued to have cough- some days are better and some days are worse. Every once and a while will cough up small amount of yellow sputum. Wakes up in the morning over last week or so and blows nose and gets fair amount of yellow discharge and feels congested. Sometimes feels congested through rest of the day.   He is on losartan which has some overlap with ACE-I for cough. Has been taking this for 3-4 years.   Denies watery, itchy eyes, sneezing. Asthma- denies history.  A/P: 68 year old male with nearly 3 months of cough. -He is already on omeprazole for reflux -lower risk for allergies as cause -Under 1-pack-year smoking history and quit in the 90s- this does not substantially increase his lung cancer risk. - With worsening sinus congestion and discharge over the last week-we opted to treat with Augmentin in case a lingering sinusitis has caused his symptoms -He would like to repeat his chest x-ray given 2 more months of cough - We also discussed losartan has about a 3% chance of causing cough-could switch him to amlodipine if x-ray unrevealing and no improvement on  Augmentin  GERD (gastroesophageal reflux disease) S: Patient reports excellent control of reflux on Prilosec 40 mg A/P: I do not suspect reflux as the cause of his chronic cough-we will continue current dose of medication  Essential hypertension S: controlled on losartan 100 mg BP Readings from Last 3 Encounters:  07/04/18 118/70  05/06/18 132/82  04/29/18 132/84  A/P: We discussed blood pressure goal of <140/90.  Stable.  We discussed changing to amlodipine perhaps 5 mg but he has done very well with losartan outside of potential cough and he wants to continue this and try other therapies to see if we can reduce cough first    Lab/Order associations: Chronic cough - Plan: DG Chest 2 View  Gastroesophageal reflux disease without esophagitis  Former smoker  Essential hypertension  Meds ordered this encounter  Medications  . amoxicillin-clavulanate (AUGMENTIN) 875-125 MG tablet    Sig: Take 1 tablet by mouth 2 (two) times daily for 7 days.    Dispense:  14 tablet    Refill:  0    Return precautions advised.  Garret Reddish, MD

## 2018-07-04 NOTE — Assessment & Plan Note (Signed)
S: Patient reports excellent control of reflux on Prilosec 40 mg A/P: I do not suspect reflux as the cause of his chronic cough-we will continue current dose of medication

## 2018-07-04 NOTE — Assessment & Plan Note (Signed)
S: controlled on losartan 100 mg BP Readings from Last 3 Encounters:  07/04/18 118/70  05/06/18 132/82  04/29/18 132/84  A/P: We discussed blood pressure goal of <140/90.  Stable.  We discussed changing to amlodipine perhaps 5 mg but he has done very well with losartan outside of potential cough and he wants to continue this and try other therapies to see if we can reduce cough first

## 2018-07-04 NOTE — Patient Instructions (Addendum)
Let's try to treat this with augmentin for potential sinus infection with worsening sinus discharge.   Id say if symptoms continue over next month- we could try amlodipine over losartan to see if that helps. We also have a good pulmonology group and can refer for chronic cough if needed.   Please stop by x-ray before you go If you do not have mychart- we will call you about results within 5 business days of [place] receiving them.  If you have mychart- we will send your results within 3 business days of [place] receiving them.  If abnormal or we want to clarify a result, we will call or mychart you to make sure you receive the message.  If you have questions or concerns or don't hear within 5-7 days, please send [place] a message or call us.

## 2018-07-09 ENCOUNTER — Ambulatory Visit (INDEPENDENT_AMBULATORY_CARE_PROVIDER_SITE_OTHER): Payer: Medicare Other | Admitting: Family Medicine

## 2018-07-09 ENCOUNTER — Encounter: Payer: Self-pay | Admitting: Family Medicine

## 2018-07-09 VITALS — BP 134/76 | HR 89 | Temp 98.8°F | Ht 72.0 in | Wt 257.2 lb

## 2018-07-09 DIAGNOSIS — R6889 Other general symptoms and signs: Secondary | ICD-10-CM | POA: Diagnosis not present

## 2018-07-09 DIAGNOSIS — J101 Influenza due to other identified influenza virus with other respiratory manifestations: Secondary | ICD-10-CM | POA: Diagnosis not present

## 2018-07-09 LAB — POCT INFLUENZA A/B
INFLUENZA B, POC: NEGATIVE
Influenza A, POC: POSITIVE — AB

## 2018-07-09 MED ORDER — OSELTAMIVIR PHOSPHATE 75 MG PO CAPS: 75.0000 mg | ORAL_CAPSULE | Freq: Two times a day (BID) | ORAL | 0 refills | Status: DC

## 2018-07-09 NOTE — Progress Notes (Signed)
PCP: Marin Olp, MD  Subjective:  Dillon [name] is a 68 y.o. year old very pleasant male patient who presents with flu like symptoms including fever/elevated temp up to 100 ,body aches.  -other symptoms: facial pressure, body aches, chills. fatigue -started: worsening symptoms started yesterday morning- seen 5 days ago with chornic cough- thought to have sinustis and treated with agumentin -inside 48 hour treatment window if needed for tamiflu: yes -high risk condition (children <5, adults >65, chronic pulmonary or cardiac condition, immunosuppression, pregnancy, nursing home resident, morbid obesity) : yes -symptoms are worsening -previous treatments: acetaminophen, ibuprofen - patient did not receive flu shot this year.  - positive sick contact- son (was tested negative for flu)  ROS-denies SOB, NV,  dental pain. Some diarrhea on augmentin  Pertinent Past Medical History-  Patient Active Problem List   Diagnosis Date Noted  . Hyperlipidemia, unspecified 05/06/2018    Priority: Medium  . GERD (gastroesophageal reflux disease) 05/06/2018    Priority: Medium  . Former smoker 05/06/2018    Priority: Medium  . Impaired glucose tolerance 09/05/2017    Priority: Medium  . OSA (obstructive sleep apnea) 11/30/2015    Priority: Medium  . Elevated PSA 08/25/2015    Priority: Medium  . Mild stage glaucoma(365.71) 02/12/2011    Priority: Medium  . Venous tributary (branch) occlusion of retina 02/12/2011    Priority: Medium  . Essential hypertension 05/16/2007    Priority: Medium  . Insomnia 05/06/2018    Priority: Low  . ERECTILE DYSFUNCTION 05/07/2008    Priority: Low    Medications- reviewed  Current Outpatient Medications  Medication Sig Dispense Refill  . amoxicillin-clavulanate (AUGMENTIN) 875-125 MG tablet Take 1 tablet by mouth 2 (two) times daily for 7 days. 14 tablet 0  . aspirin 81 MG tablet Take 81 mg by mouth daily.      Marland Kitchen latanoprost (XALATAN) 0.005 %  ophthalmic solution Place 1 drop into both eyes at bedtime.      Marland Kitchen losartan (COZAAR) 100 MG tablet TAKE 1 TABLET (100 MG TOTAL) BY MOUTH DAILY. 90 tablet 0  . omeprazole (PRILOSEC) 40 MG capsule Take 1 capsule (40 mg total) by mouth daily. 90 capsule 0  . Sildenafil Citrate (VIAGRA PO) Take by mouth.    . traZODone (DESYREL) 100 MG tablet TAKE 1 TABLET (100 MG TOTAL) BY MOUTH AT BEDTIME. 90 tablet 1  . oseltamivir (TAMIFLU) 75 MG capsule Take 1 capsule (75 mg total) by mouth 2 (two) times daily. 10 capsule 0   No current facility-administered medications for this visit.     Objective: BP 134/76 (BP Location: Left Arm, Patient Position: Sitting, Cuff Size: Large)   Pulse 89   Temp 98.8 F (37.1 C) (Oral)   Ht 6' (1.829 m)   Wt 257 lb 4 oz (116.7 kg)   SpO2 98%   BMI 34.89 kg/m  Gen: NAD, appears fatigued HEENT: Turbinates erythematous, TM normal, pharynx mildly erythematous with no tonsilar exudate or edema, no sinus tenderness CV: RRR no murmurs rubs or gallops Lungs: CTAB no crackles, wheeze, rhonchi Abdomen: soft/nontender/nondistended/normal bowel sounds. Ext: no edema Skin: warm, dry, no rash  Results for orders placed or performed in visit on 07/09/18 (from the past 24 hour(s))  POCT Influenza A/B     Status: Abnormal   Collection Time: 07/09/18 11:46 AM  Result Value Ref Range   Influenza A, POC Positive (A) Negative   Influenza B, POC Negative Negative    Assessment/Plan:  Flu-like  illness: influenza a History and exam today are suggestive of viral process. Patients influenza test was positive.  Pretest probability of influenza was high. High risk condition: yes, above ae 65 - also with chronic cough I think we should treat  Patient is treated with Tamiflu. Prophylaxis for other [facility] patients: no. Son already being treated Symptomatic treatment with: rest, hydration  Finally, we reviewed reasons to return to care including if symptoms worsen or persist or new  concerns arise.  Meds ordered this encounter  Medications  . oseltamivir (TAMIFLU) 75 MG capsule    Sig: Take 1 capsule (75 mg total) by mouth 2 (two) times daily.    Dispense:  10 capsule    Refill:  0    Garret Reddish, MD

## 2018-07-09 NOTE — Patient Instructions (Signed)
Start Tamiflu  You are contagious for at least 24 hours after being fever/largely symptom free   Influenza, Adult Influenza is also called "the flu." It is an infection in the lungs, nose, and throat (respiratory tract). It is caused by a virus. The flu causes symptoms that are similar to symptoms of a cold. It also causes a high fever and body aches. The flu spreads easily from person to person (is contagious). Getting a flu shot (influenza vaccination) every year is the best way to prevent the flu. What are the causes? This condition is caused by the influenza virus. You can get the virus by:  Breathing in droplets that are in the air from the cough or sneeze of a person who has the virus.  Touching something that has the virus on it (is contaminated) and then touching your mouth, nose, or eyes. What increases the risk? Certain things may make you more likely to get the flu. These include:  Not washing your hands often.  Having close contact with many people during cold and flu season.  Touching your mouth, eyes, or nose without first washing your hands.  Not getting a flu shot every year. You may have a higher risk for the flu, along with serious problems such as a lung infection (pneumonia), if you:  Are older than 65.  Are pregnant.  Have a weakened disease-fighting system (immune system) because of a disease or taking certain medicines.  Have a long-term (chronic) illness, such as: ? Heart, kidney, or lung disease. ? Diabetes. ? Asthma.  Have a liver disorder.  Are very overweight (morbidly obese).  Have anemia. This is a condition that affects your red blood cells. What are the signs or symptoms? Symptoms usually begin suddenly and last 4-14 days. They may include:  Fever and chills.  Headaches, body aches, or muscle aches.  Sore throat.  Cough.  Runny or stuffy (congested) nose.  Chest discomfort.  Not wanting to eat as much as normal (poor  appetite).  Weakness or feeling tired (fatigue).  Dizziness.  Feeling sick to your stomach (nauseous) or throwing up (vomiting). How is this treated? If the flu is found early, you can be treated with medicine that can help reduce how bad the illness is and how long it lasts (antiviral medicine). This may be given by mouth (orally) or through an IV tube. Taking care of yourself at home can help your symptoms get better. Your doctor may suggest:  Taking over-the-counter medicines.  Drinking plenty of fluids. The flu often goes away on its own. If you have very bad symptoms or other problems, you may be treated in a hospital. Follow these instructions at home:     Activity  Rest as needed. Get plenty of sleep.  Stay home from work or school as told by your doctor. ? Do not leave home until you do not have a fever for 24 hours without taking medicine. ? Leave home only to visit your doctor. Eating and drinking  Take an ORS (oral rehydration solution). This is a drink that is sold at pharmacies and stores.  Drink enough fluid to keep your pee (urine) pale yellow.  Drink clear fluids in small amounts as you are able. Clear fluids include: ? Water. ? Ice chips. ? Fruit juice that has water added (diluted fruit juice). ? Low-calorie sports drinks.  Eat bland, easy-to-digest foods in small amounts as you are able. These foods include: ? Bananas. ? Applesauce. ? Rice. ?  Lean meats. ? Toast. ? Crackers.  Do not eat or drink: ? Fluids that have a lot of sugar or caffeine. ? Alcohol. ? Spicy or fatty foods. General instructions  Take over-the-counter and prescription medicines only as told by your doctor.  Use a cool mist humidifier to add moisture to the air in your home. This can make it easier for you to breathe.  Cover your mouth and nose when you cough or sneeze.  Wash your hands with soap and water often, especially after you cough or sneeze. If you cannot use soap  and water, use alcohol-based hand sanitizer.  Keep all follow-up visits as told by your doctor. This is important. How is this prevented?   Get a flu shot every year. You may get the flu shot in late summer, fall, or winter. Ask your doctor when you should get your flu shot.  Avoid contact with people who are sick during fall and winter (cold and flu season). Contact a doctor if:  You get new symptoms.  You have: ? Chest pain. ? Watery poop (diarrhea). ? A fever.  Your cough gets worse.  You start to have more mucus.  You feel sick to your stomach.  You throw up. Get help right away if you:  Have shortness of breath.  Have trouble breathing.  Have skin or nails that turn a bluish color.  Have very bad pain or stiffness in your neck.  Get a sudden headache.  Get sudden pain in your face or ear.  Cannot eat or drink without throwing up. Summary  Influenza ("the flu") is an infection in the lungs, nose, and throat. It is caused by a virus.  Take over-the-counter and prescription medicines only as told by your doctor.  Getting a flu shot every year is the best way to avoid getting the flu. This information is not intended to replace advice given to you by your health care provider. Make sure you discuss any questions you have with your health care provider. Document Released: 01/25/2008 Document Revised: 10/03/2017 Document Reviewed: 10/03/2017 Elsevier Interactive Patient Education  2019 [employer].

## 2018-09-04 DIAGNOSIS — H40053 Ocular hypertension, bilateral: Secondary | ICD-10-CM | POA: Diagnosis not present

## 2018-09-04 DIAGNOSIS — H348312 Tributary (branch) retinal vein occlusion, right eye, stable: Secondary | ICD-10-CM | POA: Diagnosis not present

## 2018-09-04 DIAGNOSIS — H2513 Age-related nuclear cataract, bilateral: Secondary | ICD-10-CM | POA: Diagnosis not present

## 2018-09-17 ENCOUNTER — Encounter: Payer: Self-pay | Admitting: Family Medicine

## 2018-09-17 DIAGNOSIS — R6889 Other general symptoms and signs: Secondary | ICD-10-CM

## 2018-09-17 DIAGNOSIS — Z20822 Contact with and (suspected) exposure to covid-19: Secondary | ICD-10-CM

## 2018-09-20 NOTE — Telephone Encounter (Signed)
Spoke with patient, he will go to [facility] on [address] faxed to [phone]. Also provided pt with phone number to [facility] to contact prior to going to the facility.

## 2018-09-26 ENCOUNTER — Other Ambulatory Visit: Payer: Self-pay | Admitting: Family Medicine

## 2018-09-26 DIAGNOSIS — R6889 Other general symptoms and signs: Secondary | ICD-10-CM | POA: Diagnosis not present

## 2018-09-27 LAB — SAR COV2 SEROLOGY (COVID19)AB(IGG),IA: SARS-CoV-2 Ab, IgG: NEGATIVE

## 2018-11-04 ENCOUNTER — Other Ambulatory Visit: Payer: Self-pay

## 2018-11-04 MED ORDER — LOSARTAN POTASSIUM 100 MG PO TABS: ORAL_TABLET | ORAL | 1 refills | Status: DC

## 2018-11-08 ENCOUNTER — Encounter: Payer: Self-pay | Admitting: Family Medicine

## 2018-11-08 ENCOUNTER — Other Ambulatory Visit: Payer: Self-pay

## 2018-11-08 ENCOUNTER — Ambulatory Visit (INDEPENDENT_AMBULATORY_CARE_PROVIDER_SITE_OTHER): Payer: Medicare Other | Admitting: Family Medicine

## 2018-11-08 VITALS — BP 110/80 | HR 69 | Temp 98.2°F | Ht 71.0 in | Wt 260.6 lb

## 2018-11-08 DIAGNOSIS — R972 Elevated prostate specific antigen [PSA]: Secondary | ICD-10-CM

## 2018-11-08 DIAGNOSIS — Z6835 Body mass index (BMI) 35.0-35.9, adult: Secondary | ICD-10-CM | POA: Diagnosis not present

## 2018-11-08 DIAGNOSIS — Z1159 Encounter for screening for other viral diseases: Secondary | ICD-10-CM | POA: Diagnosis not present

## 2018-11-08 DIAGNOSIS — I10 Essential (primary) hypertension: Secondary | ICD-10-CM

## 2018-11-08 DIAGNOSIS — E785 Hyperlipidemia, unspecified: Secondary | ICD-10-CM | POA: Diagnosis not present

## 2018-11-08 DIAGNOSIS — Z0000 Encounter for general adult medical examination without abnormal findings: Secondary | ICD-10-CM | POA: Diagnosis not present

## 2018-11-08 DIAGNOSIS — Z79899 Other long term (current) drug therapy: Secondary | ICD-10-CM | POA: Diagnosis not present

## 2018-11-08 DIAGNOSIS — K219 Gastro-esophageal reflux disease without esophagitis: Secondary | ICD-10-CM

## 2018-11-08 DIAGNOSIS — G4700 Insomnia, unspecified: Secondary | ICD-10-CM | POA: Diagnosis not present

## 2018-11-08 DIAGNOSIS — R739 Hyperglycemia, unspecified: Secondary | ICD-10-CM | POA: Diagnosis not present

## 2018-11-08 DIAGNOSIS — Z87891 Personal history of nicotine dependence: Secondary | ICD-10-CM

## 2018-11-08 LAB — COMPREHENSIVE METABOLIC PANEL
ALT: 38 U/L (ref 0–53)
AST: 33 U/L (ref 0–37)
Albumin: 4.6 g/dL (ref 3.5–5.2)
Alkaline Phosphatase: 62 U/L (ref 39–117)
BUN: 23 mg/dL (ref 6–23)
CO2: 25 mEq/L (ref 19–32)
Calcium: 9.5 mg/dL (ref 8.4–10.5)
Chloride: 102 mEq/L (ref 96–112)
Creatinine, Ser: 1.26 mg/dL (ref 0.40–1.50)
GFR: 57.17 mL/min — ABNORMAL LOW (ref >60.00)
Glucose, Bld: 109 mg/dL — ABNORMAL HIGH (ref 70–99)
Potassium: 4.7 mEq/L (ref 3.5–5.1)
Sodium: 135 mEq/L (ref 135–145)
Total Bilirubin: 0.8 mg/dL (ref 0.2–1.2)
Total Protein: 7.4 g/dL (ref 6.0–8.3)

## 2018-11-08 LAB — LIPID PANEL
Cholesterol: 161 mg/dL (ref 0–200)
HDL: 30.8 mg/dL — ABNORMAL LOW (ref >39.00)
LDL Cholesterol: 90 mg/dL (ref 0–99)
NonHDL: 129.96
Total CHOL/HDL Ratio: 5
Triglycerides: 200 mg/dL — ABNORMAL HIGH (ref 0.0–149.0)
VLDL: 40 mg/dL (ref 0.0–40.0)

## 2018-11-08 LAB — HEMOGLOBIN A1C: Hgb A1c MFr Bld: 6.5 % (ref 4.6–6.5)

## 2018-11-08 LAB — CBC
HCT: 43.2 % (ref 39.0–52.0)
Hemoglobin: 14.4 g/dL (ref 13.0–17.0)
MCHC: 33.3 g/dL (ref 30.0–36.0)
MCV: 85.4 fl (ref 78.0–100.0)
Platelets: 140 10*3/uL — ABNORMAL LOW (ref 150.0–400.0)
RBC: 5.06 Mil/uL (ref 4.22–5.81)
RDW: 15.6 % — ABNORMAL HIGH (ref 11.5–15.5)
WBC: 5.5 10*3/uL (ref 4.0–10.5)

## 2018-11-08 LAB — VITAMIN B12: Vitamin B-12: 236 pg/mL (ref 211–911)

## 2018-11-08 MED ORDER — OMEPRAZOLE 20 MG PO CPDR: 20.0000 mg | DELAYED_RELEASE_CAPSULE | Freq: Every day | ORAL | 3 refills | Status: DC

## 2018-11-08 NOTE — Patient Instructions (Addendum)
Health Maintenance Due  Topic Date Due  . Hepatitis C Screening wants with labs  [birth year]  . PNA vac Low Risk Adult - recommend pneumovax 23- he is going to research. Recommended defer shingrix given overlap of side effects with covid 19 symptoms.  05/12/2017   Dillon [name] , Thank you for taking time to come for your Medicare Wellness Visit. I appreciate your ongoing commitment to your health goals. Please review the following plan we discussed and let me know if I can assist you in the future.   These are the goals we discussed: 1. Stop by lab today 2. We will call you within two weeks about your referral to aneurysm screening. If you do not hear within 3 weeks, give [place] a call.  3. Try prilosec 20mg - if reflux or cough worsens we can go back to 40mg    This is a list of the screening recommended for you and due dates:  Health Maintenance  Topic Date Due  .  Hepatitis C: One time screening is recommended by Center for Disease Control  (CDC) for  adults born from [birth year] through 1963.   [birth year]  . Pneumonia vaccines (1 of 2 - PCV13) 05/12/2017  . Flu Shot  11/30/2018  . Colon Cancer Screening  12/26/2022  . Tetanus Vaccine  03/07/2023

## 2018-11-08 NOTE — Progress Notes (Addendum)
Phone: [phone]    Subjective:   Patient presents today for their annual wellness visit.    Preventive Screening-Counseling & Management  Smoking Status: Former smoker-0.4 pack years and quit in the 80s. Recommended AAA scan - he opts in Second Hand Smoking status: No smokers in home Alcohol intake: 4 Per week  Patient has a living will and is full code   Risk Factors:  Regular exercise: walking 1-2x a week for 20 minutes. We discussed bosting to 150 mins per week - gets some diffuse achiness in legs which makes him feel like walking less- apparently fell back on a ladder rung really hard many years ago - x-rays were ok with ortho but no issues recently. More achiness with sitting- does not sound like claudication.  Diet: plan was for weight loss- has had slight weight gain this year . Discussed reversing this trend/healthy habits. Technically morbid obesity with BMi over 35 with HTN and HLD.  Wt Readings from Last 3 Encounters:  11/08/18 260 lb 9.6 oz (118.2 kg)  07/09/18 257 lb 4 oz (116.7 kg)  07/04/18 255 lb 6.1 oz (115.8 kg)   Fall Risk: None  Fall Risk  11/08/2018 09/04/2017  Falls in the past year? 1 No  Number falls in past yr: 0 -  Injury with Fall? 0 -  Opioid use history:  no long term opioids use  Cardiac risk factors:  advanced age (older than 44 for men, 98 for women)  Untreated Hyperlipidemia  Treated Hypertension  No diabetes. Will screen with a1c Family History:  heart attack in father , brother with CAD- 35 years older than patient  Depression Screen None. PHQ2 0  Depression screen [facility] 2/9 11/08/2018 09/04/2017 07/27/2015 06/22/2014  Decreased Interest 0 0 0 0  Down, Depressed, Hopeless 0 0 0 0  PHQ - 2 Score 0 0 0 0   Activities of Daily Living Independent ADLs and IADLs   Hearing Difficulties: -patient declines significant issues other than when in a crowded room like restaurant. Declines referral to audiology  Cognitive Testing             No  reported trouble.   Normal 3 word recall and clock draw- perfect mini cog test  List the Names of Other Physician/Practitioners you currently use: -Dr. Nevada Crane with [facility] urology -Dr. Ishmael Holter with [facility] (retiring) - Dr. Felton Clinton with [facility] may 2020  No hospitalizations in last year  Immunization History  Administered Date(s) Administered  . Tdap 03/06/2013   Required Immunizations needed today - recommend pneumovax 23- he is going to research. Recommended defer shingrix given overlap of side effects with covid 19 symptoms.   Screening tests- up to date 1. Offered HCV screen- he opts in   2. Colon cancer screening-completed August 27th 2014.  Will be due in 2024 3.  Lung cancer screening-not a candidate due to low number pack years and time he quit smoking 4.  Prostate cancer screening-  followed with [facility] urology in the past Lab Results  Component Value Date   PSA 1.99 09/04/2017   PSA 2.39 08/14/2016   PSA 8.33 (H) 07/27/2015    Health Maintenance  Topic Date Due  .  Hepatitis C: One time screening is recommended by Center for Disease Control  (CDC) for  adults born from [birth year] through 1963.   July 17, 1950  . Pneumonia vaccines (1 of 2 - PCV13) 05/12/2017  . Flu Shot  11/30/2018  . Colon Cancer  Screening  12/26/2022  . Tetanus Vaccine  03/07/2023   ROS- No pertinent positives discovered in course of AWV ROS pertinent- No chest pain or shortness of breath. No headache or blurry vision.   The following were reviewed and entered/updated in epic: Past Medical History:  Diagnosis Date  . ED (erectile dysfunction)   . Elevated liver function tests    2020 labs normal- issues in past   . Hypertension    Patient Active Problem List   Diagnosis Date Noted  . Hyperlipidemia, unspecified 05/06/2018    Priority: Medium  . GERD (gastroesophageal reflux disease) 05/06/2018    Priority: Medium  . Former smoker 05/06/2018    Priority: Medium  .  Impaired glucose tolerance 09/05/2017    Priority: Medium  . OSA (obstructive sleep apnea) 11/30/2015    Priority: Medium  . Elevated PSA 08/25/2015    Priority: Medium  . Mild stage glaucoma(365.71) 02/12/2011    Priority: Medium  . Venous tributary (branch) occlusion of retina 02/12/2011    Priority: Medium  . Essential hypertension 05/16/2007    Priority: Medium  . Insomnia 05/06/2018    Priority: Low  . ERECTILE DYSFUNCTION 05/07/2008    Priority: Low   Past Surgical History:  Procedure Laterality Date  . WRIST GANGLION EXCISION     he reports several cysts in past    Family History  Problem Relation Age of Onset  . Coronary artery disease Brother        stent. patient 68 years older than patient.   . Hyperlipidemia Brother   . Stroke Mother        complications after a fall  . Heart attack Father        age 100. smoker  . Hypertension Brother   . Other Brother        airplane crash  . Colon cancer Maternal Uncle 78  . Prostate cancer Maternal Uncle     Medications- reviewed and updated Current Outpatient Medications  Medication Sig Dispense Refill  . aspirin 81 MG tablet Take 81 mg by mouth daily.      Marland Kitchen latanoprost (XALATAN) 0.005 % ophthalmic solution Place 1 drop into both eyes at bedtime.      Marland Kitchen losartan (COZAAR) 100 MG tablet TAKE 1 TABLET (100 MG TOTAL) BY MOUTH DAILY. 90 tablet 1  . omeprazole (PRILOSEC) 20 MG capsule Take 1 capsule (20 mg total) by mouth daily. 90 capsule 3  . Sildenafil Citrate (VIAGRA PO) Take by mouth.    . traZODone (DESYREL) 100 MG tablet TAKE 1 TABLET (100 MG TOTAL) BY MOUTH AT BEDTIME. 90 tablet 1   No current facility-administered medications for this visit.     Allergies-reviewed and updated No Known Allergies  Social History   Socioeconomic History  . Marital status: Divorced    Spouse name: Not on file  . Number of children: Not on file  . Years of education: Not on file  . Highest education level: Not on file   Occupational History  . Not on file  Social Needs  . Financial resource strain: Not on file  . Food insecurity    Worry: Not on file    Inability: Not on file  . Transportation needs    Medical: Not on file    Non-medical: Not on file  Tobacco Use  . Smoking status: Former Smoker    Packs/day: 0.20    Years: 2.00    Pack years: 0.40    Quit date: 05/02/1991  Years since quitting: 27.5  . Smokeless tobacco: Never Used  Substance and Sexual Activity  . Alcohol use: Yes    Alcohol/week: 2.0 standard drinks    Types: 2 Standard drinks or equivalent per week  . Drug use: No  . Sexual activity: Yes  Lifestyle  . Physical activity    Days per week: Not on file    Minutes per session: Not on file  . Stress: Not on file  Relationships  . Social Herbalist on phone: Not on file    Gets together: Not on file    Attends religious service: Not on file    Active member of club or organization: Not on file    Attends meetings of clubs or organizations: Not on file    Relationship status: Not on file  Other Topics Concern  . Not on file  Social History Narrative   Divorced. Dating some. 3 children from prior marriage. 4 grandsons. No pets.       Self employeed Materials engineer     Objective:  BP 110/80   Pulse 69   Temp 98.2 F (36.8 C) (Oral)   Ht 5\' 11"  (1.803 m)   Wt 260 lb 9.6 oz (118.2 kg)   SpO2 97%   BMI 36.35 kg/m  Gen: NAD, resting comfortably HEENT: Mucous membranes are moist. Oropharynx normal Neck: no thyromegaly and no cervical lymphadenopathy CV: RRR no murmurs rubs or gallops Lungs: CTAB no crackles, wheeze, rhonchi Abdomen: soft/nontender/nondistended/normal bowel sounds. No rebound or guarding.  Ext: no edema and 2+ pulses Skin: warm, dry Neuro: grossly normal, moves all extremities, PERRLA    Assessment/Plan:  AWV completed- discussed recommended screenings and documented any personalized health advice and referrals for preventive  counseling. See AVS as well which was given to patient.   Status of chronic or acute concerns   #hypertension S: controlled on  losartan 100mg  . Have considered amlodipine 5 mg in past given ongoing cough BP Readings from Last 3 Encounters:  11/08/18 110/80  07/09/18 134/76  07/04/18 118/70  A/P: Stable. Continue current medications.   #hyperlipidemia S: poorly controlled on no statin. updatate lipids. On aspirin for primary prevention - wanted to work on lifestyle instead of starting statin Lab Results  Component Value Date   CHOL 171 09/04/2017   HDL 29.60 (L) 09/04/2017   LDLCALC 104 (H) 08/14/2016   LDLDIRECT 112.0 09/04/2017   TRIG 223.0 (H) 09/04/2017   CHOLHDL 6 09/04/2017   A/P: update lipids today and 10 year risk calculation - discussed possible coronary CT if risk under 20% as strongly suspect will be above 7.5%  # Insomnia S:remains well controlled on trazodone  100mg  at bedtime A/P: Stable. Continue current medications.    # Chronic cough S:chronic cough noted in march 2020- for 3 months. Was on omeprazole for reflux. No major allergies. Under 1 pack year smoking and quit in 90s. Chest x-ray was reassuring. Tried augmentin- mentioned losartan had a chance of causing issues. Shortly after noted to have influenza and was treated A/P: much improved at this point- only sparing cough at this point-  Chest x-ray was reassuring in march. Somewhat hesitant to increase PPI but we will trial as below as GERD could contribute.   # GERD S:patient compliant with prilosec 40mg .  controlled A/P: we will trial a 20mg  dose- he will let us know if this is not effectie.    # hyperglycemia S: high blood sugar last year but not  in diabetes range  A/P: will screen with a1c today for diabetes  6 month check in  Lab/Order associations: Patient had a biscuit (1 bite of bun from biscuit and chicken)  and dark coffee this am   ICD-10-CM   1. Preventative health care  Z00.00 VAS US AORTA  MEDICARE SCREEN    Hepatitis C antibody    Hemoglobin A1c    CBC    Comprehensive metabolic panel    Lipid panel    Vitamin B12  2. Hyperlipidemia, unspecified hyperlipidemia type  E78.5 CBC    Comprehensive metabolic panel    Lipid panel  3. Gastroesophageal reflux disease without esophagitis  K21.9   4. Essential hypertension  I10   5. Insomnia, unspecified type  G47.00   6. Hyperglycemia  R73.9 Hemoglobin A1c  7. Morbid obesity ([place])  E66.01   8. Severe obesity (BMI 35.0-35.9 with comorbidity) (HCC)  E66.01    Z68.35   9. Encounter for hepatitis C screening test for low risk patient  Z11.59 Hepatitis C antibody  10. Former smoker  Z87.891 VAS US AORTA MEDICARE SCREEN  11. High risk medication use  Z79.899 Vitamin B12  12. Elevated PSA  R97.20     Meds ordered this encounter  Medications  . omeprazole (PRILOSEC) 20 MG capsule    Sig: Take 1 capsule (20 mg total) by mouth daily.    Dispense:  90 capsule    Refill:  3   Return precautions advised. Garret Reddish, MD

## 2018-11-11 LAB — HEPATITIS C ANTIBODY
Hepatitis C Ab: NONREACTIVE
SIGNAL TO CUT-OFF: 0.01 (ref <1.00)

## 2018-11-12 ENCOUNTER — Encounter: Payer: Self-pay | Admitting: Family Medicine

## 2018-11-12 DIAGNOSIS — E785 Hyperlipidemia, unspecified: Secondary | ICD-10-CM

## 2018-11-12 DIAGNOSIS — R739 Hyperglycemia, unspecified: Secondary | ICD-10-CM

## 2018-11-13 DIAGNOSIS — R972 Elevated prostate specific antigen [PSA]: Secondary | ICD-10-CM | POA: Diagnosis not present

## 2018-11-13 NOTE — Telephone Encounter (Signed)
Future lab orders placed. Called pt, no answer/no VM.

## 2018-11-14 NOTE — Telephone Encounter (Signed)
Called pt and left VM to call the office.  

## 2018-11-18 DIAGNOSIS — N529 Male erectile dysfunction, unspecified: Secondary | ICD-10-CM | POA: Diagnosis not present

## 2018-11-18 DIAGNOSIS — R972 Elevated prostate specific antigen [PSA]: Secondary | ICD-10-CM | POA: Diagnosis not present

## 2018-11-19 ENCOUNTER — Encounter: Payer: Self-pay | Admitting: Family Medicine

## 2018-11-28 ENCOUNTER — Ambulatory Visit (HOSPITAL_COMMUNITY)
Admission: RE | Admit: 2018-11-28 | Discharge: 2018-11-28 | Disposition: A | Discharge status: Home / Self Care | Payer: Medicare Other | Source: Ambulatory Visit | Attending: Internal Medicine | Admitting: Internal Medicine

## 2018-11-28 ENCOUNTER — Telehealth: Payer: Self-pay | Admitting: *Deleted

## 2018-11-28 ENCOUNTER — Other Ambulatory Visit: Payer: Self-pay

## 2018-11-28 DIAGNOSIS — Z87891 Personal history of nicotine dependence: Secondary | ICD-10-CM | POA: Diagnosis not present

## 2018-11-28 DIAGNOSIS — Z0000 Encounter for general adult medical examination without abnormal findings: Secondary | ICD-10-CM | POA: Diagnosis not present

## 2018-11-28 DIAGNOSIS — Z136 Encounter for screening for cardiovascular disorders: Secondary | ICD-10-CM | POA: Diagnosis not present

## 2018-11-28 NOTE — Telephone Encounter (Signed)
Patient called for results- patient was not on phone line when connected.  (Results:  Notes recorded by Wendy Poet, LAT on 11/28/2018 at 3:31 PM EDT  Called pt and LM regarding results of his abdominal aorta. CRM created and ok for [facility] to give results.  ------   Notes recorded by Marin Olp, MD on 11/28/2018 at 12:00 PM EDT  No aneurysm was found in aorta- great news)  Attempted to call patient back- had to leave message on VM.

## 2018-12-03 ENCOUNTER — Encounter: Payer: Self-pay | Admitting: Family Medicine

## 2019-05-13 ENCOUNTER — Ambulatory Visit: Payer: Medicare Other | Admitting: Family Medicine

## 2019-05-15 ENCOUNTER — Other Ambulatory Visit: Payer: Self-pay

## 2019-05-16 ENCOUNTER — Encounter: Payer: Self-pay | Admitting: Family Medicine

## 2019-05-16 ENCOUNTER — Ambulatory Visit (INDEPENDENT_AMBULATORY_CARE_PROVIDER_SITE_OTHER): Payer: Medicare Other | Admitting: Family Medicine

## 2019-05-16 VITALS — BP 132/86 | HR 97 | Temp 97.6°F | Wt 264.0 lb

## 2019-05-16 DIAGNOSIS — I10 Essential (primary) hypertension: Secondary | ICD-10-CM

## 2019-05-16 DIAGNOSIS — K219 Gastro-esophageal reflux disease without esophagitis: Secondary | ICD-10-CM | POA: Diagnosis not present

## 2019-05-16 DIAGNOSIS — R739 Hyperglycemia, unspecified: Secondary | ICD-10-CM | POA: Diagnosis not present

## 2019-05-16 DIAGNOSIS — E785 Hyperlipidemia, unspecified: Secondary | ICD-10-CM

## 2019-05-16 DIAGNOSIS — Z23 Encounter for immunization: Secondary | ICD-10-CM | POA: Diagnosis not present

## 2019-05-16 DIAGNOSIS — G4700 Insomnia, unspecified: Secondary | ICD-10-CM

## 2019-05-16 LAB — COMPREHENSIVE METABOLIC PANEL
ALT: 35 U/L (ref 0–53)
AST: 30 U/L (ref 0–37)
Albumin: 4.5 g/dL (ref 3.5–5.2)
Alkaline Phosphatase: 61 U/L (ref 39–117)
BUN: 18 mg/dL (ref 6–23)
CO2: 27 mEq/L (ref 19–32)
Calcium: 10 mg/dL (ref 8.4–10.5)
Chloride: 100 mEq/L (ref 96–112)
Creatinine, Ser: 1.15 mg/dL (ref 0.40–1.50)
GFR: 63.43 mL/min (ref >60.00)
Glucose, Bld: 117 mg/dL — ABNORMAL HIGH (ref 70–99)
Potassium: 4.3 mEq/L (ref 3.5–5.1)
Sodium: 135 mEq/L (ref 135–145)
Total Bilirubin: 0.8 mg/dL (ref 0.2–1.2)
Total Protein: 7.3 g/dL (ref 6.0–8.3)

## 2019-05-16 LAB — LIPID PANEL
Cholesterol: 170 mg/dL (ref 0–200)
HDL: 33.3 mg/dL — ABNORMAL LOW (ref >39.00)
NonHDL: 136.45
Total CHOL/HDL Ratio: 5
Triglycerides: 206 mg/dL — ABNORMAL HIGH (ref 0.0–149.0)
VLDL: 41.2 mg/dL — ABNORMAL HIGH (ref 0.0–40.0)

## 2019-05-16 LAB — CBC
HCT: 44 % (ref 39.0–52.0)
Hemoglobin: 14.3 g/dL (ref 13.0–17.0)
MCHC: 32.5 g/dL (ref 30.0–36.0)
MCV: 84.9 fl (ref 78.0–100.0)
Platelets: 149 10*3/uL — ABNORMAL LOW (ref 150.0–400.0)
RBC: 5.19 Mil/uL (ref 4.22–5.81)
RDW: 16.2 % — ABNORMAL HIGH (ref 11.5–15.5)
WBC: 5.5 10*3/uL (ref 4.0–10.5)

## 2019-05-16 LAB — LDL CHOLESTEROL, DIRECT: Direct LDL: 120 mg/dL

## 2019-05-16 LAB — HEMOGLOBIN A1C: Hgb A1c MFr Bld: 6.6 % — ABNORMAL HIGH (ref 4.6–6.5)

## 2019-05-16 NOTE — Addendum Note (Signed)
Addended by: Francis Dowse T on: 05/16/2019 08:47 AM   Modules accepted: Orders

## 2019-05-16 NOTE — Progress Notes (Signed)
Phone [phone] In person visit   Subjective:   Dillon [name] is a 69 y.o. year old very pleasant male patient who presents for/with See problem oriented charting Chief Complaint  Patient presents with  . Follow-up    This visit occurred during the SARS-CoV-2 public health emergency.  Safety protocols were in place, including screening questions prior to the visit, additional usage of staff PPE, and extensive cleaning of exam room while observing appropriate contact time as indicated for disinfecting solutions.   Past Medical History-  Patient Active Problem List   Diagnosis Date Noted  . Hyperlipidemia, unspecified 05/06/2018    Priority: Medium  . GERD (gastroesophageal reflux disease) 05/06/2018    Priority: Medium  . Former smoker 05/06/2018    Priority: Medium  . Hyperglycemia 09/05/2017    Priority: Medium  . OSA (obstructive sleep apnea) 11/30/2015    Priority: Medium  . Elevated PSA 08/25/2015    Priority: Medium  . Mild stage glaucoma(365.71) 02/12/2011    Priority: Medium  . Venous tributary (branch) occlusion of retina 02/12/2011    Priority: Medium  . Essential hypertension 05/16/2007    Priority: Medium  . Insomnia 05/06/2018    Priority: Low  . ERECTILE DYSFUNCTION 05/07/2008    Priority: Low  . Morbid obesity ([place]) 05/09/2016    Medications- reviewed and updated Current Outpatient Medications  Medication Sig Dispense Refill  . aspirin 81 MG tablet Take 81 mg by mouth daily.      Marland Kitchen latanoprost (XALATAN) 0.005 % ophthalmic solution Place 1 drop into both eyes at bedtime.      Marland Kitchen losartan (COZAAR) 100 MG tablet TAKE 1 TABLET (100 MG TOTAL) BY MOUTH DAILY. 90 tablet 1  . omeprazole (PRILOSEC) 20 MG capsule Take 1 capsule (20 mg total) by mouth daily. 90 capsule 3  . Sildenafil Citrate (VIAGRA PO) Take by mouth.    . traZODone (DESYREL) 100 MG tablet TAKE 1 TABLET (100 MG TOTAL) BY MOUTH AT BEDTIME. 90 tablet 1   No current facility-administered  medications for this visit.     Objective:  BP 132/86   Pulse 97   Temp 97.6 F (36.4 C) (Temporal)   Wt 264 lb (119.7 kg)   SpO2 97%   BMI 36.82 kg/m  Gen: NAD, resting comfortably CV: RRR no murmurs rubs or gallops Lungs: CTAB no crackles, wheeze, rhonchi Ext: no edema Skin: warm, dry    Assessment and Plan  #hypertension S: controlled on  losartan 100mg  . Have considered amlodipine 5 mg in past given ongoing cough.  A/P: Stable. Continue current medications.   #hyperlipidemia S: poorly controlled on no statin. updatate lipids. On aspirin for primary prevention - wanted to work on lifestyle instead of starting statin Lab Results  Component Value Date   CHOL 161 11/08/2018   HDL 30.80 (L) 11/08/2018   LDLCALC 90 11/08/2018   LDLDIRECT 112.0 09/04/2017   TRIG 200.0 (H) 11/08/2018   CHOLHDL 5 11/08/2018  A/P: 10 year ascvd risk of 20.1% based on last lipids- low HDL likely is big driver for increased risk.  - he is going to focus on increased exercise and weight loss and recalculate in 6 months - he is interested in coronary CT potentially- wants to discuss visit  # Insomnia S:remains well controlled on trazodone  100mg  at bedtime. Only takes about once a week. On average gets about 6-7 hours a night of sleep.  A/P: Stable. Continue current medications.   # Chronic cough S:chronic cough  noted in march 2020- for 3 months. Was on omeprazole for reflux. No major allergies. Under 1 pack year smoking and quit in 90s. Chest x-ray was reassuring. Tried augmentin- mentioned losartan had a chance of causing issues. Shortly after noted to have influenza and was treated. At last visit cough was much improved only sparing. Trial of PPI to see if GERD was contributor to cough. Today he reports  No recent coughh A/P: much improved/essentially resolved- will continue to monitor for recurrence  # GERD S:patient was on Prilosec 40 mg and well controlled. At last visit we started  trial of 20mg  to see if effective. Patient states working well at 20 mg. No issues or side effects.  A/P: Stable. Continue current medications.   # hyperglycemia/morbid obesity with BMI over 35 with hypertension and hyplerlipidemia S: high blood sugar last year but not in diabetes range. A1c at last visit was 6.5.   He states weight up 10 lbs from october Wt Readings from Last 3 Encounters:  05/16/19 264 lb (119.7 kg)  11/08/18 260 lb 9.6 oz (118.2 kg)  07/09/18 257 lb 4 oz (116.7 kg)   Lab Results  Component Value Date   HGBA1C 6.5 11/08/2018   A/P: will screen with a1c today for diabetes- if remains elevated would lead to diagnosis of diabetes -his plan is to add walking daily -wants to cut back on sweets and cut down on evening meal size  #urology visit in July 2020- psa continuing to trend down  Recommended follow up: Return in about 6 months (around 11/13/2019) for follow up- or sooner if needed.  Lab/Order associations:   ICD-10-CM   1. Essential hypertension  I10   2. Hyperlipidemia, unspecified hyperlipidemia type  E78.5 Lipid panel  3. Gastroesophageal reflux disease without esophagitis  K21.9   4. Insomnia, unspecified type  G47.00   5. Hyperglycemia  R73.9   6. Morbid obesity ([place])  E66.01    Return precautions advised.  Garret Reddish, MD

## 2019-05-16 NOTE — Patient Instructions (Addendum)
Health Maintenance Due  Topic Date Due  . PNA vac Low Risk Adult (1 of 2 - PCV13) would like to get next time  05/12/2017  . INFLUENZA VACCINE will get today in office  11/30/2018   After covid vaccine-  Please check with your pharmacy to see if they have the shingrix vaccine. If they do- please get this immunization and update [place] by phone call or mychart with dates you receive the vaccine  Team please release 3 labs from last visit plus lab I ordered today Please stop by lab before you go If you do not have mychart- we will call you about results within 5 business days of [place] receiving them.  If you have mychart- we will send your results within 3 business days of [place] receiving them.  If abnormal or we want to clarify a result, we will call or mychart you to make sure you receive the message.  If you have questions or concerns or don't hear within 5-7 days, please send [place] a message or call us.   COVID-19 Vaccine Information can be found at: [email or web address].uk For questions related to vaccine distribution or appointments, please email [email or web address] or call [phone].    Recommended follow up: Return in about 6 months (around 11/13/2019) for CPE and immu .   Marland KitchenMarland KitchenIt takes about 2 weeks for protection to develop after vaccination.  There are many flu viruses, and they are always changing. Each year a new flu vaccine is made to protect against three or four viruses that are likely to cause disease in the upcoming flu season. Even when the vaccine doesn't exactly match these viruses, it may still provide some protection.   Influenza vaccine does not cause flu.  Influenza vaccine may be given at the same time as other vaccines.  3. Talk with your health care provider  Tell your vaccine provider if the person getting the vaccine: ; Has had an allergic reaction after a previous dose of influenza vaccine, or has any severe,  life-threatening allergies.  ; Has ever had Guillain-Barr Syndrome (also called GBS).  In some cases, your health care provider may decide to postpone influenza vaccination to a future visit.  People with minor illnesses, such as a cold, may be vaccinated. People who are moderately or severely ill should usually wait until they recover before getting influenza vaccine.  Your health care provider can give you more information.  4. Risks of a reaction  ; Soreness, redness, and swelling where shot is given, fever, muscle aches, and headache can happen after influenza vaccine. ; There may be a very small increased risk of Guillain-Barr Syndrome (GBS) after inactivated influenza vaccine (the flu shot).  Young children who get the flu shot along with pneumococcal vaccine (PCV13), and/or DTaP vaccine at the same time might be slightly more likely to have a seizure caused by fever. Tell your health care provider if a child who is getting flu vaccine has ever had a seizure.  People sometimes faint after medical procedures, including vaccination. Tell your provider if you feel dizzy or have vision changes or ringing in the ears.  As with any medicine, there is a very remote chance of a vaccine causing a severe allergic reaction, other serious injury, or death.  5. What if there is a serious problem?  An allergic reaction could occur after the vaccinated person leaves the clinic. If you see signs of a severe allergic reaction (hives, swelling of the face and throat, difficulty breathing,  a fast heartbeat, dizziness, or weakness), call 9-1-1 and get the person to the nearest hospital.  For other signs that concern you, call your health care provider.  Adverse reactions should be reported to the Vaccine Adverse Event Reporting System (VAERS). Your health care provider will usually file this report, or you can do it yourself. Visit the VAERS website at www.vaers.SamedayNews.es or call 380-021-3595.  VAERS  is only for reporting reactions, and VAERS staff do not give medical advice.  6. The National Vaccine Injury Compensation Program  The [employer] Vaccine Injury Compensation Program (VICP) is a federal program that was created to compensate people who may have been injured by certain vaccines. Visit the VICP website at GoldCloset.com.ee or call 587-165-5937 to learn about the program and about filing a claim. There is a time limit to file a claim for compensation.  7. How can I learn more?  ; Ask your health care provider.  ; Call your local or state health department. ; Contact the Centers for Disease Control and Prevention (CDC): - Call (724)605-0339 (1-800-CDC-INFO) or - Visit CDC's influenza website at https://gibson.com/  Vaccine Information Statement (Interim) Inactivated Influenza Vaccine  12/13/2017 42 U.S.C.  ([phone]   Department of Health and Geneticist, molecular for Disease Control and Prevention  Office Use Only

## 2019-05-17 ENCOUNTER — Encounter: Payer: Self-pay | Admitting: Family Medicine

## 2019-05-19 ENCOUNTER — Encounter: Payer: Self-pay | Admitting: Family Medicine

## 2019-05-31 ENCOUNTER — Other Ambulatory Visit: Payer: Self-pay | Admitting: Family Medicine

## 2019-08-30 IMAGING — DX DG CHEST 2V
2 series · 2 of 2 positions shown · non-contrast
Comparison: 04/25/2018 chest radiograph.

CLINICAL DATA: Cough, bronchitis

EXAM:
CHEST - 2 VIEW

[chest pa]
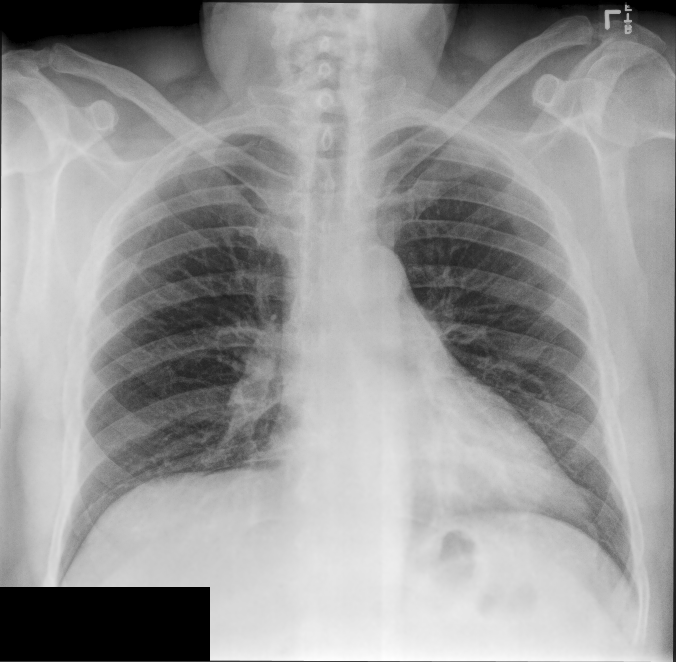

[chest lat]
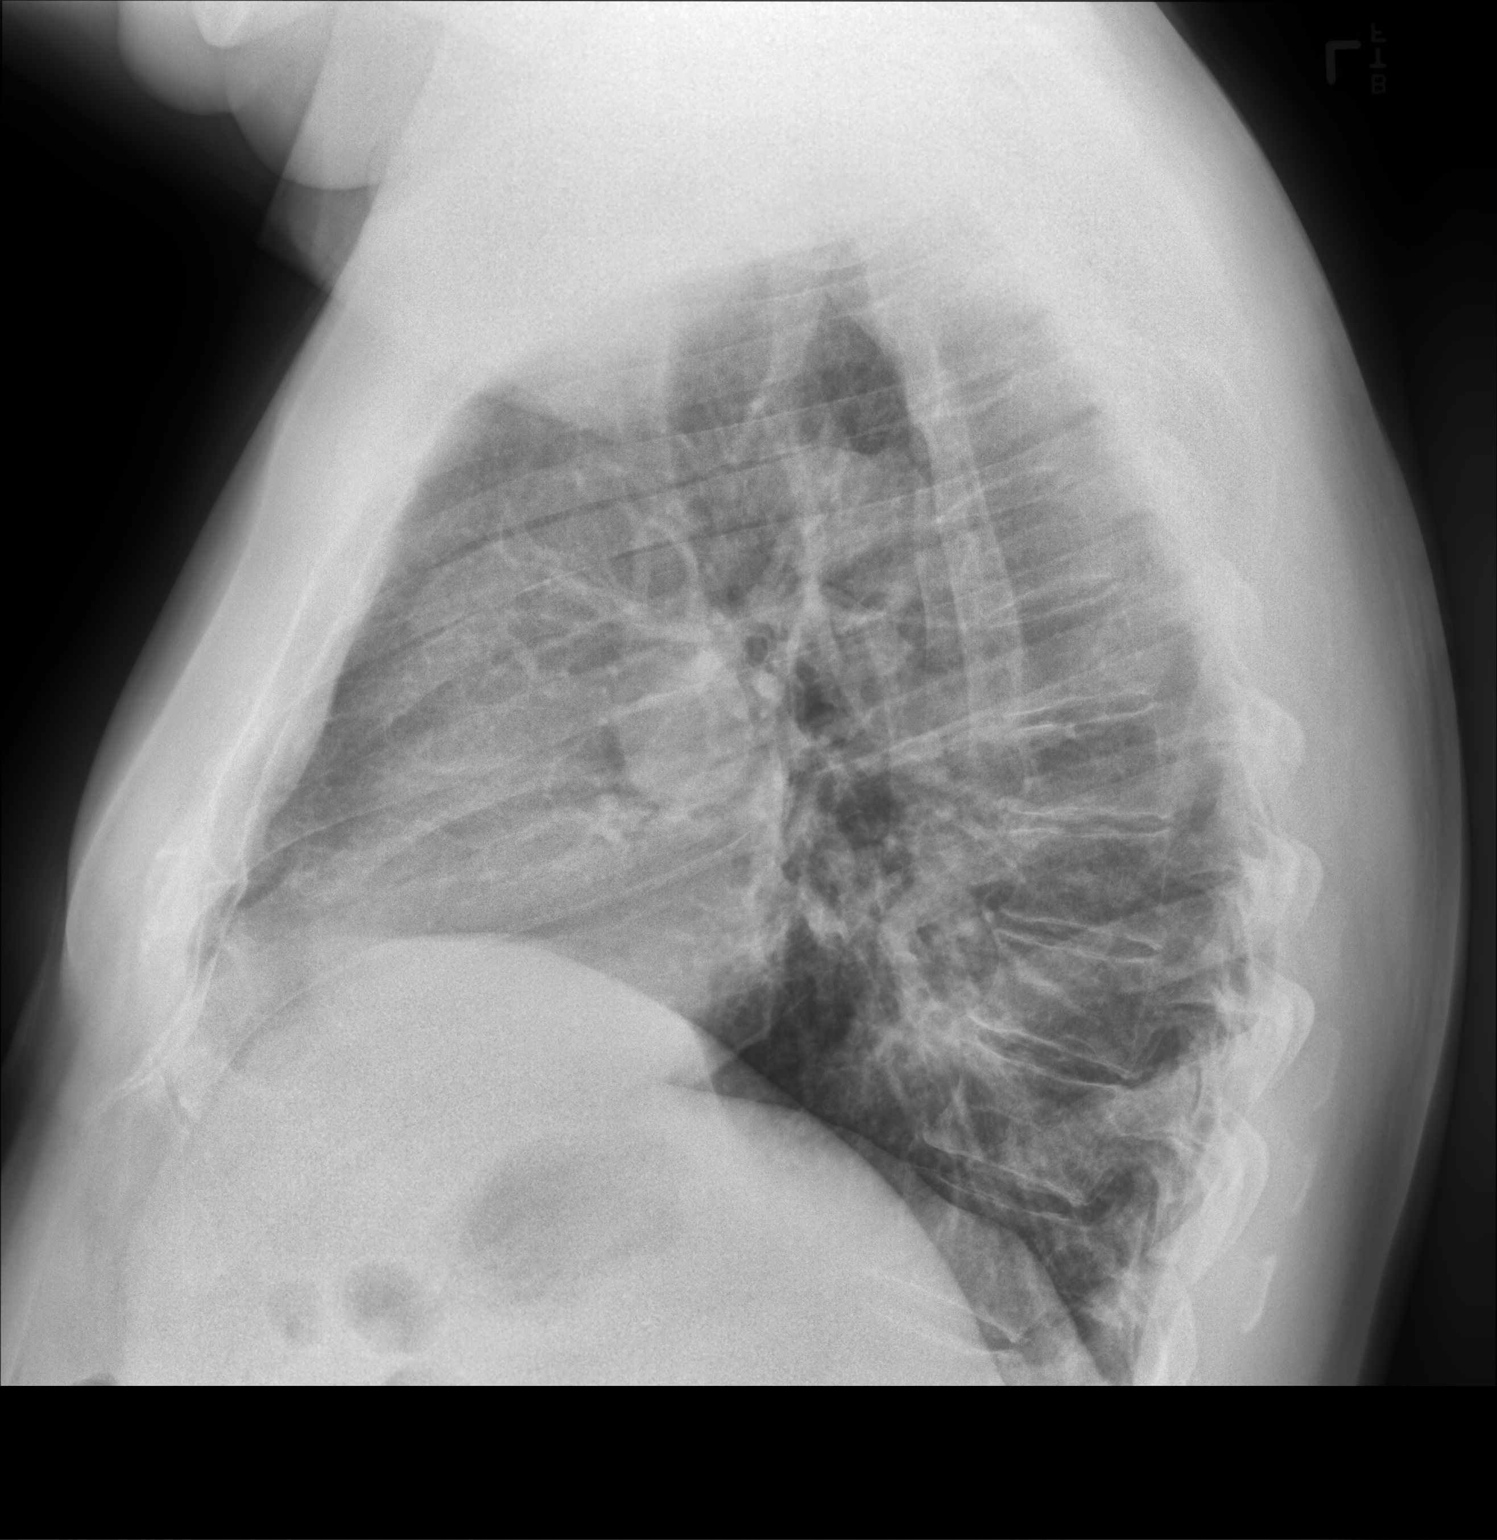

[2 of 2 positions shown; findings below may reference images not displayed]

FINDINGS: Stable cardiomediastinal silhouette with normal heart size. No
pneumothorax. No pleural effusion. Lungs appear clear, with no acute
consolidative airspace disease and no pulmonary edema.
IMPRESSION: No active cardiopulmonary disease.

## 2019-09-15 DIAGNOSIS — H31011 Macula scars of posterior pole (postinflammatory) (post-traumatic), right eye: Secondary | ICD-10-CM | POA: Diagnosis not present

## 2019-09-15 DIAGNOSIS — H401131 Primary open-angle glaucoma, bilateral, mild stage: Secondary | ICD-10-CM | POA: Diagnosis not present

## 2019-09-15 DIAGNOSIS — H2513 Age-related nuclear cataract, bilateral: Secondary | ICD-10-CM | POA: Diagnosis not present

## 2019-11-13 ENCOUNTER — Ambulatory Visit (INDEPENDENT_AMBULATORY_CARE_PROVIDER_SITE_OTHER): Payer: Medicare Other

## 2019-11-13 DIAGNOSIS — Z0000 Encounter for general adult medical examination without abnormal findings: Secondary | ICD-10-CM

## 2019-11-13 NOTE — Patient Instructions (Addendum)
Dillon [name] , Thank you for taking time to come for your Medicare Wellness Visit. I appreciate your ongoing commitment to your health goals. Please review the following plan we discussed and let me know if I can assist you in the future.   Screening recommendations/referrals: Colonoscopy: Done 12/25/2012 Recommended yearly ophthalmology/optometry visit for glaucoma screening and checkup Recommended yearly dental visit for hygiene and checkup  Vaccinations: Influenza vaccine: Up to date Pneumococcal vaccine: Declined Tdap vaccine: Up to date Shingles vaccine: Shingrix discussed. Please contact your pharmacy for coverage information.    Covid-19: Completed 2/13/ & 07/09/19  Advanced directives: Advance directive discussed with you today. I have provided a copy for you to complete at home and have notarized. Once this is complete please bring a copy in to our office so we can scan it into your chart.   Conditions/risks identified: Lose weight this year  Next appointment: Follow up in one year for your annual wellness visit.   Preventive Care 67 Years and Older, Male Preventive care refers to lifestyle choices and visits with your health care provider that can promote health and wellness. What does preventive care include?  A yearly physical exam. This is also called an annual well check.  Dental exams once or twice a year.  Routine eye exams. Ask your health care provider how often you should have your eyes checked.  Personal lifestyle choices, including:  Daily care of your teeth and gums.  Regular physical activity.  Eating a healthy diet.  Avoiding tobacco and drug use.  Limiting alcohol use.  Practicing safe sex.  Taking low doses of aspirin every day.  Taking vitamin and mineral supplements as recommended by your health care provider. What happens during an annual well check? The services and screenings done by your health care provider during your annual well check  will depend on your age, overall health, lifestyle risk factors, and family history of disease. Counseling  Your health care provider may ask you questions about your:  Alcohol use.  Tobacco use.  Drug use.  Emotional well-being.  Home and relationship well-being.  Sexual activity.  Eating habits.  History of falls.  Memory and ability to understand (cognition).  Work and work Statistician. Screening  You may have the following tests or measurements:  Height, weight, and BMI.  Blood pressure.  Lipid and cholesterol levels. These may be checked every 5 years, or more frequently if you are over 8 years old.  Skin check.  Lung cancer screening. You may have this screening every year starting at age 74 if you have a 30-pack-year history of smoking and currently smoke or have quit within the past 15 years.  Fecal occult blood test (FOBT) of the stool. You may have this test every year starting at age 40.  Flexible sigmoidoscopy or colonoscopy. You may have a sigmoidoscopy every 5 years or a colonoscopy every 10 years starting at age 11.  Prostate cancer screening. Recommendations will vary depending on your family history and other risks.  Hepatitis C blood test.  Hepatitis B blood test.  Sexually transmitted disease (STD) testing.  Diabetes screening. This is done by checking your blood sugar (glucose) after you have not eaten for a while (fasting). You may have this done every 1-3 years.  Abdominal aortic aneurysm (AAA) screening. You may need this if you are a current or former smoker.  Osteoporosis. You may be screened starting at age 49 if you are at high risk. Talk with your health  care provider about your test results, treatment options, and if necessary, the need for more tests. Vaccines  Your health care provider may recommend certain vaccines, such as:  Influenza vaccine. This is recommended every year.  Tetanus, diphtheria, and acellular pertussis  (Tdap, Td) vaccine. You may need a Td booster every 10 years.  Zoster vaccine. You may need this after age 56.  Pneumococcal 13-valent conjugate (PCV13) vaccine. One dose is recommended after age 49.  Pneumococcal polysaccharide (PPSV23) vaccine. One dose is recommended after age 12. Talk to your health care provider about which screenings and vaccines you need and how often you need them. This information is not intended to replace advice given to you by your health care provider. Make sure you discuss any questions you have with your health care provider. Document Released: 05/14/2015 Document Revised: 01/05/2016 Document Reviewed: 02/16/2015 Elsevier Interactive Patient Education  2017 [place] Prevention in the Home Falls can cause injuries. They can happen to people of all ages. There are many things you can do to make your home safe and to help prevent falls. What can I do on the outside of my home?  Regularly fix the edges of walkways and driveways and fix any cracks.  Remove anything that might make you trip as you walk through a door, such as a raised step or threshold.  Trim any bushes or trees on the path to your home.  Use bright outdoor lighting.  Clear any walking paths of anything that might make someone trip, such as rocks or tools.  Regularly check to see if handrails are loose or broken. Make sure that both sides of any steps have handrails.  Any raised decks and porches should have guardrails on the edges.  Have any leaves, snow, or ice cleared regularly.  Use sand or salt on walking paths during winter.  Clean up any spills in your garage right away. This includes oil or grease spills. What can I do in the bathroom?  Use night lights.  Install grab bars by the toilet and in the tub and shower. Do not use towel bars as grab bars.  Use non-skid mats or decals in the tub or shower.  If you need to sit down in the shower, use a plastic, non-slip  stool.  Keep the floor dry. Clean up any water that spills on the floor as soon as it happens.  Remove soap buildup in the tub or shower regularly.  Attach bath mats securely with double-sided non-slip rug tape.  Do not have throw rugs and other things on the floor that can make you trip. What can I do in the bedroom?  Use night lights.  Make sure that you have a light by your bed that is easy to reach.  Do not use any sheets or blankets that are too big for your bed. They should not hang down onto the floor.  Have a firm chair that has side arms. You can use this for support while you get dressed.  Do not have throw rugs and other things on the floor that can make you trip. What can I do in the kitchen?  Clean up any spills right away.  Avoid walking on wet floors.  Keep items that you use a lot in easy-to-reach places.  If you need to reach something above you, use a strong step stool that has a grab bar.  Keep electrical cords out of the way.  Do not use floor  polish or wax that makes floors slippery. If you must use wax, use non-skid floor wax.  Do not have throw rugs and other things on the floor that can make you trip. What can I do with my stairs?  Do not leave any items on the stairs.  Make sure that there are handrails on both sides of the stairs and use them. Fix handrails that are broken or loose. Make sure that handrails are as long as the stairways.  Check any carpeting to make sure that it is firmly attached to the stairs. Fix any carpet that is loose or worn.  Avoid having throw rugs at the top or bottom of the stairs. If you do have throw rugs, attach them to the floor with carpet tape.  Make sure that you have a light switch at the top of the stairs and the bottom of the stairs. If you do not have them, ask someone to add them for you. What else can I do to help prevent falls?  Wear shoes that:  Do not have high heels.  Have rubber bottoms.  Are  comfortable and fit you well.  Are closed at the toe. Do not wear sandals.  If you use a stepladder:  Make sure that it is fully opened. Do not climb a closed stepladder.  Make sure that both sides of the stepladder are locked into place.  Ask someone to hold it for you, if possible.  Clearly mark and make sure that you can see:  Any grab bars or handrails.  First and last steps.  Where the edge of each step is.  Use tools that help you move around (mobility aids) if they are needed. These include:  Canes.  Walkers.  Scooters.  Crutches.  Turn on the lights when you go into a dark area. Replace any light bulbs as soon as they burn out.  Set up your furniture so you have a clear path. Avoid moving your furniture around.  If any of your floors are uneven, fix them.  If there are any pets around you, be aware of where they are.  Review your medicines with your doctor. Some medicines can make you feel dizzy. This can increase your chance of falling. Ask your doctor what other things that you can do to help prevent falls. This information is not intended to replace advice given to you by your health care provider. Make sure you discuss any questions you have with your health care provider. Document Released: 02/11/2009 Document Revised: 09/23/2015 Document Reviewed: 05/22/2014 Elsevier Interactive Patient Education  2017 [employer].

## 2019-11-13 NOTE — Progress Notes (Signed)
Subjective:   Dillon [name] is a 69 y.o. male who presents for Medicare Annual/Subsequent preventive examination.    Virtual Visit via Telephone Note  I connected with  Dillon [name] on 11/13/19 at  8:00 AM EDT by telephone and verified that I am speaking with the correct person using two identifiers.  Medicare Annual Wellness visit completed telephonically due to Covid-19 pandemic.   Persons participating in this call: This Health Coach and this patient.   Location: Patient: Home Provider: Office   I discussed the limitations, risks, security and privacy concerns of performing an evaluation and management service by telephone and the availability of in person appointments. The patient expressed understanding and agreed to proceed.  Unable to perform video visit due to video visit attempted and failed and/or patient does not have video capability.   Some vital signs may be absent or patient reported.   [name], LPN    Review of Systems    Cardiac Risk Factors include: hypertension;dyslipidemia;male gender;obesity (BMI >30kg/m2)     Objective:    There were no vitals filed for this visit. There is no height or weight on file to calculate BMI.  Advanced Directives 11/13/2019 04/28/2018 12/19/2015  Does Patient Have a Medical Advance Directive? Yes No Yes  Type of Advance Directive - - [place];Living will  Does patient want to make changes to medical advance directive? Yes (MAU/Ambulatory/Procedural Areas - Information given) - No - Patient declined  Copy of [place] in Chart? - - No - copy requested    Current Medications (verified) Outpatient Encounter Medications as of 11/13/2019  Medication Sig  . aspirin 81 MG tablet Take 81 mg by mouth daily.    Marland Kitchen latanoprost (XALATAN) 0.005 % ophthalmic solution Place 1 drop into both eyes at bedtime.    Marland Kitchen losartan (COZAAR) 100 MG tablet TAKE ONE TABLET BY MOUTH DAILY  .  omeprazole (PRILOSEC) 20 MG capsule Take 1 capsule (20 mg total) by mouth daily.  . Sildenafil Citrate (VIAGRA PO) Take by mouth.  . [DISCONTINUED] traZODone (DESYREL) 100 MG tablet TAKE 1 TABLET (100 MG TOTAL) BY MOUTH AT BEDTIME. (Patient not taking: Reported on 11/13/2019)   No facility-administered encounter medications on file as of 11/13/2019.    Allergies (verified) Patient has no known allergies.   History: Past Medical History:  Diagnosis Date  . ED (erectile dysfunction)   . Elevated liver function tests    2020 labs normal- issues in past   . Hypertension    Past Surgical History:  Procedure Laterality Date  . WRIST GANGLION EXCISION     he reports several cysts in past   Family History  Problem Relation Age of Onset  . Coronary artery disease Brother        stent. patient 78 years older than patient.   . Hyperlipidemia Brother   . Stroke Mother        complications after a fall  . Heart attack Father        age 55. smoker  . Hypertension Brother   . Cancer Brother   . Other Brother        airplane crash  . Colon cancer Maternal Uncle 78  . Prostate cancer Maternal Uncle    Social History   Socioeconomic History  . Marital status: Divorced    Spouse name: Not on file  . Number of children: Not on file  . Years of education: Not on file  .  Highest education level: Not on file  Occupational History  . Occupation: semi retired  Tobacco Use  . Smoking status: Former Smoker    Packs/day: 0.20    Years: 2.00    Pack years: 0.40    Quit date: 05/02/1991    Years since quitting: 28.5  . Smokeless tobacco: Never Used  Substance and Sexual Activity  . Alcohol use: Yes    Alcohol/week: 2.0 standard drinks    Types: 2 Standard drinks or equivalent per week  . Drug use: No  . Sexual activity: Yes  Other Topics Concern  . Not on file  Social History Narrative   Divorced. Dating some. 3 children from prior marriage. 4 grandsons. No pets.       Self employeed  Materials engineer   Social Determinants of Health   Financial Resource Strain: Low Risk   . Difficulty of Paying Living Expenses: Not hard at all  Food Insecurity: No Food Insecurity  . Worried About Charity fundraiser in the Last Year: Never true  . Ran Out of Food in the Last Year: Never true  Transportation Needs: No Transportation Needs  . Lack of Transportation (Medical): No  . Lack of Transportation (Non-Medical): No  Physical Activity: Insufficiently Active  . Days of Exercise per Week: 2 days  . Minutes of Exercise per Session: 30 min  Stress: No Stress Concern Present  . Feeling of Stress : Only a little  Social Connections: Moderately Integrated  . Frequency of Communication with Friends and Family: More than three times a week  . Frequency of Social Gatherings with Friends and Family: Twice a week  . Attends Religious Services: 1 to 4 times per year  . Active Member of Clubs or Organizations: Yes  . Attends Archivist Meetings: 1 to 4 times per year  . Marital Status: Divorced    Tobacco Counseling Counseling given: Not Answered   Clinical Intake:  Pre-visit preparation completed: Yes  Pain : No/denies pain     BMI - recorded: 36.82 Nutritional Status: BMI > 30  Obese Nutritional Risks: None Diabetes: No  How often do you need to have someone help you when you read instructions, pamphlets, or other written materials from your doctor or pharmacy?: 1 - Never  Diabetic?No  Interpreter Needed?: No  Information entered by :: Charlott Rakes, LPN   Activities of Daily Living In your present state of health, do you have any difficulty performing the following activities: 11/13/2019 05/16/2019  Hearing? N N  Vision? N N  Difficulty concentrating or making decisions? N N  Walking or climbing stairs? N N  Dressing or bathing? N N  Doing errands, shopping? N N  Preparing Food and eating ? N -  Using the Toilet? N -  In the past six months, have  you accidently leaked urine? N -  Do you have problems with loss of bowel control? N -  Managing your Medications? N -  Managing your Finances? N -  Housekeeping or managing your Housekeeping? N -  Some recent data might be hidden    Patient Care Team: Marin Olp, MD as PCP - General (Family Medicine)  Indicate any recent Medical Services you may have received from other than [facility] providers in the past year (date may be approximate).     Assessment:   This is a routine wellness examination for [place].  Hearing/Vision screen  Hearing Screening   125Hz  250Hz  500Hz  1000Hz  2000Hz  3000Hz  4000Hz  6000Hz   8000Hz   Right ear:           Left ear:           Comments: Pt states mild hearing in loud places  Vision Screening Comments: [facility] follows up annually and wears eyeglasses and contacts  Dietary issues and exercise activities discussed: Current Exercise Habits: Home exercise routine, Type of exercise: walking, Time (Minutes): 30, Frequency (Times/Week): 2, Weekly Exercise (Minutes/Week): 60, Exercise limited by: Other - see comments (Pt states he know he has to increase exercise)  Goals    . Patient Stated     Lose weight this year      Depression Screen PHQ 2/9 Scores 11/13/2019 11/08/2018 09/04/2017 07/27/2015 06/22/2014  PHQ - 2 Score 0 0 0 0 0    Fall Risk Fall Risk  11/13/2019 11/08/2018 09/04/2017  Falls in the past year? 0 1 No  Number falls in past yr: 0 0 -  Injury with Fall? 0 0 -  Risk for fall due to : Impaired vision - -    Any stairs in or around the home? Yes  If so, are there any without handrails? No  Home free of loose throw rugs in walkways, pet beds, electrical cords, etc? Yes  Adequate lighting in your home to reduce risk of falls? Yes   ASSISTIVE DEVICES UTILIZED TO PREVENT FALLS:  Life alert? No  Use of a cane, walker or w/c? No  Grab bars in the bathroom? No  Shower chair or bench in shower? No  Elevated toilet seat or a handicapped  toilet? No   TIMED UP AND GO:  Was the test performed? No .   Cognitive Function:     6CIT Screen 11/13/2019  What Year? 0 points  What month? 0 points  What time? 0 points  Count back from 20 0 points  Months in reverse 0 points  Repeat phrase 0 points  Total Score 0    Immunizations Immunization History  Administered Date(s) Administered  . Fluad Quad(high Dose 65+) 05/16/2019  . Tdap 03/06/2013    TDAP status: Up to date Flu Vaccine status: Up to date Pneumococcal vaccine status: Declined,  Education has been provided regarding the importance of this vaccine but patient still declined. Advised may receive this vaccine at local pharmacy or Health Dept. Aware to provide a copy of the vaccination record if obtained from local pharmacy or Health Dept. Verbalized acceptance and understanding.  Covid-19 vaccine status: Completed vaccines  Qualifies for Shingles Vaccine? Yes   Zostavax completed No   Shingrix Completed?: No.    Education has been provided regarding the importance of this vaccine. Patient has been advised to call insurance company to determine out of pocket expense if they have not yet received this vaccine. Advised may also receive vaccine at local pharmacy or Health Dept. Verbalized acceptance and understanding.  Screening Tests Health Maintenance  Topic Date Due  . FOOT EXAM  Never done  . OPHTHALMOLOGY EXAM  Never done  . COVID-19 Vaccine (1) Never done  . PNA vac Low Risk Adult (1 of 2 - PCV13) Never done  . HEMOGLOBIN A1C  11/13/2019  . INFLUENZA VACCINE  11/30/2019  . COLONOSCOPY  12/26/2022  . TETANUS/TDAP  03/07/2023  . Hepatitis C Screening  Completed    Health Maintenance  Health Maintenance Due  Topic Date Due  . FOOT EXAM  Never done  . OPHTHALMOLOGY EXAM  Never done  . COVID-19 Vaccine (1) Never done  .  PNA vac Low Risk Adult (1 of 2 - PCV13) Never done  . HEMOGLOBIN A1C  11/13/2019    Colorectal cancer screening: Completed 12/25/12.  Repeat every 10 years   Additional Screening:  Hepatitis C Screening: does qualify;   Vision Screening: Recommended annual ophthalmology exams for early detection of glaucoma and other disorders of the eye. Is the patient up to date with their annual eye exam?  Yes  Who is the provider or what is the name of the office in which the patient attends annual eye exams? [facility]   Dental Screening: Recommended annual dental exams for proper oral hygiene  Community Resource Referral / Chronic Care Management: CRR required this visit?  No   CCM required this visit?  No      Plan:     I have personally reviewed and noted the following in the patient's chart:   . Medical and social history . Use of alcohol, tobacco or illicit drugs  . Current medications and supplements . Functional ability and status . Nutritional status . Physical activity . Advanced directives . List of other physicians . Hospitalizations, surgeries, and ER visits in previous 12 months . Vitals . Screenings to include cognitive, depression, and falls . Referrals and appointments  In addition, I have reviewed and discussed with patient certain preventive protocols, quality metrics, and best practice recommendations. A written personalized care plan for preventive services as well as general preventive health recommendations were provided to patient.     [name], LPN   [id number]   Nurse Notes: None

## 2019-11-24 ENCOUNTER — Encounter: Payer: Self-pay | Admitting: Family Medicine

## 2019-11-24 DIAGNOSIS — H40053 Ocular hypertension, bilateral: Secondary | ICD-10-CM | POA: Diagnosis not present

## 2019-11-24 DIAGNOSIS — H348312 Tributary (branch) retinal vein occlusion, right eye, stable: Secondary | ICD-10-CM | POA: Diagnosis not present

## 2019-11-24 DIAGNOSIS — H2513 Age-related nuclear cataract, bilateral: Secondary | ICD-10-CM | POA: Diagnosis not present

## 2019-11-24 NOTE — Patient Instructions (Addendum)
Health Maintenance Due  Topic Date Due   OPHTHALMOLOGY EXAM -have them send [place] a copy. Can sign a release of information at the desk to have them send to [place].  Never done   Please stop by lab before you go If you have mychart- we will send your results within 3 business days of [place] receiving them.  If you do not have mychart- we will call you about results within 5 business days of [place] receiving them.   We will call you within two weeks about your referral to dermatology. If you do not hear within 3 weeks, give [place] a call.   Lets start rosuvastatin 5 mg once a week.   Let me know if you want a genetics referral in relation to brothers genetic variatin  Follow up in 6 months

## 2019-11-24 NOTE — Progress Notes (Signed)
Phone [phone] In person visit   Subjective:   Dillon [name] is a 69 y.o. year old very pleasant male patient who presents for/with See problem oriented charting Chief Complaint  Patient presents with  . Annual Exam    This visit occurred during the SARS-CoV-2 public health emergency.  Safety protocols were in place, including screening questions prior to the visit, additional usage of staff PPE, and extensive cleaning of exam room while observing appropriate contact time as indicated for disinfecting solutions.   Past Medical History-  Patient Active Problem List   Diagnosis Date Noted  . Diabetes mellitus without complication ([place]) 98/33/8250    Priority: High  . Hyperlipidemia, unspecified 05/06/2018    Priority: Medium  . GERD (gastroesophageal reflux disease) 05/06/2018    Priority: Medium  . Former smoker 05/06/2018    Priority: Medium  . OSA (obstructive sleep apnea) 11/30/2015    Priority: Medium  . Elevated PSA 08/25/2015    Priority: Medium  . Mild stage glaucoma(365.71) 02/12/2011    Priority: Medium  . Venous tributary (branch) occlusion of retina 02/12/2011    Priority: Medium  . Essential hypertension 05/16/2007    Priority: Medium  . Insomnia 05/06/2018    Priority: Low  . ERECTILE DYSFUNCTION 05/07/2008    Priority: Low  . Morbid obesity ([place]) 05/09/2016    Medications- reviewed and updated Current Outpatient Medications  Medication Sig Dispense Refill  . aspirin 81 MG tablet Take 81 mg by mouth daily.      Marland Kitchen latanoprost (XALATAN) 0.005 % ophthalmic solution Place 1 drop into both eyes at bedtime.      Marland Kitchen losartan (COZAAR) 100 MG tablet TAKE ONE TABLET BY MOUTH DAILY 90 tablet 3  . omeprazole (PRILOSEC) 20 MG capsule Take 1 capsule (20 mg total) by mouth daily. 90 capsule 3  . Sildenafil Citrate (VIAGRA PO) Take by mouth.    . rosuvastatin (CRESTOR) 5 MG tablet Take 1 tablet (5 mg total) by mouth once a week. 13 tablet 3   No current  facility-administered medications for this visit.     Objective:  BP (!) 132/80   Pulse 79   Temp 98.1 F (36.7 C)   Ht 5\' 11"  (1.803 m)   Wt (!) 259 lb 9.6 oz (117.8 kg)   SpO2 96%   BMI 36.21 kg/m  Gen: NAD, resting comfortably CV: RRR no murmurs rubs or gallops Lungs: CTAB no crackles, wheeze, rhonchi Abdomen: soft/nontender/nondistende Ext: no edema Skin: warm, dry  Diabetic Foot Exam - Simple   Simple Foot Form Diabetic Foot exam was performed with the following findings: Yes 11/26/2019  7:56 AM  Visual Inspection No deformities, no ulcerations, no other skin breakdown bilaterally: Yes Sensation Testing Intact to touch and monofilament testing bilaterally: Yes Pulse Check Posterior Tibialis and Dorsalis pulse intact bilaterally: Yes Comments        Assessment and Plan   # Diabetes- new diagnosis at last visit with 2 consecutive a1c 6.5 or above S: Medication:none  CBGs- does not check Exercise and diet- lost 10 lbs at home but gained some back- still down 5 lbs from last visit. Reduces calories by doing 2 meals a day and finding that helpful.  Lab Results  Component Value Date   HGBA1C 6.6 (H) 05/16/2019   HGBA1C 6.5 11/08/2018   A/P: hopefully controlled update a1c today. In past has gotten down to 245 with 2 meals a day and wants to continue that   #hyperlipidemia S: Medication:No medications  for cholesterol directly. On assa for primary prevention Lab Results  Component Value Date   CHOL 170 05/16/2019   HDL 33.30 (L) 05/16/2019   LDLCALC 90 11/08/2018   LDLDIRECT 120.0 05/16/2019   TRIG 206.0 (H) 05/16/2019   CHOLHDL 5 05/16/2019   A/P: after discussion today- with increased cardiac risk with diabetes- he is willing to try rosuvastatin 5 mg once a week as long as no side effects .   #hypertension S: medication: Losartan 100Mg . Cough at times and have considered amlodipine instead but symptoms improved with PPI.  CXR reassuring since start in past-  not having anymore cough.  Home readings #s: 130s/80s or lowers BP Readings from Last 3 Encounters:  11/26/19 (!) 132/80  05/16/19 132/86  11/08/18 110/80  A/P: Stable. Continue current medications.   # GERD S:Omeprazole 20 mg daily . Really helped his cough B12 levels related to PPI use: Omeperazole 20Mg  Lab Results  Component Value Date   VITAMINB12 236 11/08/2018  A/P: we considered stopping this or changing to pepcid but with potential for cough to resume- we opted to hold off at least until 6-12 month visit.   # family history of cancer/genetic disorder. They are saying possible melanoma. He is a heavy smoker. Genetic testing Krasg12d positive brother. Patient does agree to referral for skin cancer screening.   #hemorrhoid- recently popped but resolved with conservative care at home  Recommended follow up: 6 months follow up Future Appointments  Date Time Provider [place]  11/18/2020  8:00 AM [facility]ACH [facility]   Lab/Order associations:   ICD-10-CM   1. Diabetes mellitus without complication (HCC)  E94.0 Hemoglobin A1c    Comprehensive metabolic panel    CBC with Differential/Platelet    CANCELED: CBC with Differential/Platelet    CANCELED: Comprehensive metabolic panel    CANCELED: Hemoglobin A1c  2. Essential hypertension  I10 Comprehensive metabolic panel    CBC with Differential/Platelet    CANCELED: CBC with Differential/Platelet    CANCELED: Comprehensive metabolic panel  3. Gastroesophageal reflux disease without esophagitis  K21.9   4. Hyperlipidemia, unspecified hyperlipidemia type  E78.5 CANCELED: Lipid panel  5. High risk medication use  Z79.899 Vitamin B12    CANCELED: Vitamin B12  6. Need for pneumococcal vaccination  Z23 Pneumococcal conjugate vaccine 13-valent IM  7. Skin cancer screening  Z12.83 Ambulatory referral to Dermatology    Meds ordered this encounter  Medications  . rosuvastatin (CRESTOR) 5 MG tablet    Sig: Take  1 tablet (5 mg total) by mouth once a week.    Dispense:  13 tablet    Refill:  3    Return precautions advised.  Garret Reddish, MD

## 2019-11-26 ENCOUNTER — Encounter: Payer: Self-pay | Admitting: Family Medicine

## 2019-11-26 ENCOUNTER — Other Ambulatory Visit: Payer: Self-pay

## 2019-11-26 ENCOUNTER — Ambulatory Visit (INDEPENDENT_AMBULATORY_CARE_PROVIDER_SITE_OTHER): Payer: Medicare Other | Admitting: Family Medicine

## 2019-11-26 VITALS — BP 132/80 | HR 79 | Temp 98.1°F | Ht 71.0 in | Wt 259.6 lb

## 2019-11-26 DIAGNOSIS — E119 Type 2 diabetes mellitus without complications: Secondary | ICD-10-CM | POA: Diagnosis not present

## 2019-11-26 DIAGNOSIS — Z1283 Encounter for screening for malignant neoplasm of skin: Secondary | ICD-10-CM

## 2019-11-26 DIAGNOSIS — K219 Gastro-esophageal reflux disease without esophagitis: Secondary | ICD-10-CM | POA: Diagnosis not present

## 2019-11-26 DIAGNOSIS — Z8489 Family history of other specified conditions: Secondary | ICD-10-CM | POA: Insufficient documentation

## 2019-11-26 DIAGNOSIS — I10 Essential (primary) hypertension: Secondary | ICD-10-CM | POA: Diagnosis not present

## 2019-11-26 DIAGNOSIS — Z23 Encounter for immunization: Secondary | ICD-10-CM

## 2019-11-26 DIAGNOSIS — Z79899 Other long term (current) drug therapy: Secondary | ICD-10-CM

## 2019-11-26 DIAGNOSIS — E785 Hyperlipidemia, unspecified: Secondary | ICD-10-CM | POA: Diagnosis not present

## 2019-11-26 MED ORDER — ROSUVASTATIN CALCIUM 5 MG PO TABS: 5.0000 mg | ORAL_TABLET | ORAL | 3 refills | Status: DC

## 2019-11-27 LAB — COMPREHENSIVE METABOLIC PANEL
AG Ratio: 1.6 (calc) (ref 1.0–2.5)
ALT: 29 U/L (ref 9–46)
AST: 28 U/L (ref 10–35)
Albumin: 4.4 g/dL (ref 3.6–5.1)
Alkaline phosphatase (APISO): 62 U/L (ref 35–144)
BUN: 18 mg/dL (ref 7–25)
CO2: 26 mmol/L (ref 20–32)
Calcium: 9.6 mg/dL (ref 8.6–10.3)
Chloride: 101 mmol/L (ref 98–110)
Creat: 1.24 mg/dL (ref 0.70–1.25)
Globulin: 2.7 g/dL (calc) (ref 1.9–3.7)
Glucose, Bld: 115 mg/dL — ABNORMAL HIGH (ref 65–99)
Potassium: 4.3 mmol/L (ref 3.5–5.3)
Sodium: 134 mmol/L — ABNORMAL LOW (ref 135–146)
Total Bilirubin: 0.7 mg/dL (ref 0.2–1.2)
Total Protein: 7.1 g/dL (ref 6.1–8.1)

## 2019-11-27 LAB — CBC WITH DIFFERENTIAL/PLATELET
Absolute Monocytes: 615 cells/uL (ref 200–950)
Basophils Absolute: 110 cells/uL (ref 0–200)
Basophils Relative: 2.2 %
Eosinophils Absolute: 260 cells/uL (ref 15–500)
Eosinophils Relative: 5.2 %
HCT: 40.6 % (ref 38.5–50.0)
Hemoglobin: 12.4 g/dL — ABNORMAL LOW (ref 13.2–17.1)
Lymphs Abs: 2175 cells/uL (ref 850–3900)
MCH: 24.8 pg — ABNORMAL LOW (ref 27.0–33.0)
MCHC: 30.5 g/dL — ABNORMAL LOW (ref 32.0–36.0)
MCV: 81.2 fL (ref 80.0–100.0)
MPV: 11.2 fL (ref 7.5–12.5)
Monocytes Relative: 12.3 %
Neutro Abs: 1840 cells/uL (ref 1500–7800)
Neutrophils Relative %: 36.8 %
Platelets: 163 10*3/uL (ref 140–400)
RBC: 5 10*6/uL (ref 4.20–5.80)
RDW: 14.9 % (ref 11.0–15.0)
Total Lymphocyte: 43.5 %
WBC: 5 10*3/uL (ref 3.8–10.8)

## 2019-11-27 LAB — VITAMIN B12: Vitamin B-12: 338 pg/mL (ref 200–1100)

## 2019-11-27 LAB — HEMOGLOBIN A1C
Hgb A1c MFr Bld: 6.4 % of total Hgb — ABNORMAL HIGH (ref <5.7)
Mean Plasma Glucose: 137 (calc)
eAG (mmol/L): 7.6 (calc)

## 2019-12-01 ENCOUNTER — Telehealth: Payer: Self-pay

## 2019-12-01 ENCOUNTER — Telehealth: Payer: Self-pay | Admitting: Dermatology

## 2019-12-01 NOTE — Telephone Encounter (Signed)
Referral; scheduled 05/19/20 @10 :00 w/ST. [place] (?)

## 2019-12-01 NOTE — Telephone Encounter (Signed)
Pt called office to return JoEllens's call from past friday

## 2019-12-02 NOTE — Telephone Encounter (Signed)
See lab notes

## 2019-12-13 ENCOUNTER — Other Ambulatory Visit: Payer: Self-pay

## 2019-12-13 ENCOUNTER — Telehealth (INDEPENDENT_AMBULATORY_CARE_PROVIDER_SITE_OTHER): Payer: Medicare Other | Admitting: Family Medicine

## 2019-12-13 ENCOUNTER — Encounter: Payer: Self-pay | Admitting: Family Medicine

## 2019-12-13 VITALS — BP 122/79 | HR 103 | Temp 99.2°F | Ht 71.0 in | Wt 259.0 lb

## 2019-12-13 DIAGNOSIS — R21 Rash and other nonspecific skin eruption: Secondary | ICD-10-CM

## 2019-12-13 DIAGNOSIS — W57XXXA Bitten or stung by nonvenomous insect and other nonvenomous arthropods, initial encounter: Secondary | ICD-10-CM | POA: Diagnosis not present

## 2019-12-13 MED ORDER — DOXYCYCLINE HYCLATE 100 MG PO TABS: 100.0000 mg | ORAL_TABLET | Freq: Two times a day (BID) | ORAL | 0 refills | Status: DC

## 2019-12-13 NOTE — Progress Notes (Signed)
   Dillon [name] is a 69 y.o. male who presents today for a virtual office visit.  Assessment/Plan:  Rash / Fever Concern for prodromal phase of RMSF given fever and malaise.  No upper respiratory symptoms or other obvious clear sources of infection.  Does have some slight erythema to area of tick bite but clinically does not appear to be cellulitis.  Regardless, will start course of doxycycline 100 mg twice daily for 7 days which should treat both RMSF and underlying cellulitis.  Recommended he use over-the-counter analgesics and antipyretics as needed.  Discussed reasons to return to care.  Follow-up as needed.    Subjective:  HPI:  Patient with tick bite about 5 days ago.  He is not sure how long it was attached but thinks it was loose for a day.  He pulled the tick off.  Over the last few days he has noticed more redness and swelling to the area.  He is also noticed a fever over the last couple of days.  Had a fever to 100.7 F yesterday.  No other symptoms.  No cough, sneeze, shortness of breath, or rhinorrhea.  Rash is only localized to the area where he pulled the tick off.  No known sick contacts.  No specific treatments tried. No headache.        Objective/Observations  Physical Exam: Gen: NAD, resting comfortably Pulm: Normal work of breathing Neuro: Grossly normal, moves all extremities Psych: Normal affect and thought content  Virtual Visit via Video   I connected with [name] on 12/13/19 at  9:20 AM EDT by a video enabled telemedicine application and verified that I am speaking with the correct person using two identifiers. The limitations of evaluation and management by telemedicine and the availability of in person appointments were discussed. The patient expressed understanding and agreed to proceed.   Patient location: Home Provider location: [place] participating in the virtual visit: Myself and Patient     [name]. Jerline Pain,  MD 12/13/2019 8:54 AM

## 2019-12-18 ENCOUNTER — Other Ambulatory Visit: Payer: Self-pay | Admitting: Family Medicine

## 2020-01-07 ENCOUNTER — Encounter: Payer: Self-pay | Admitting: Family Medicine

## 2020-01-08 ENCOUNTER — Other Ambulatory Visit: Payer: Self-pay

## 2020-01-12 ENCOUNTER — Other Ambulatory Visit: Payer: Self-pay

## 2020-01-12 DIAGNOSIS — R898 Other abnormal findings in specimens from other organs, systems and tissues: Secondary | ICD-10-CM

## 2020-01-19 ENCOUNTER — Encounter: Payer: Self-pay | Admitting: Licensed Clinical Social Worker

## 2020-01-19 ENCOUNTER — Encounter: Payer: Self-pay | Admitting: Family Medicine

## 2020-01-20 ENCOUNTER — Inpatient Hospital Stay: Payer: Medicare Other

## 2020-01-20 ENCOUNTER — Encounter: Payer: Self-pay | Admitting: Licensed Clinical Social Worker

## 2020-01-20 ENCOUNTER — Inpatient Hospital Stay: Payer: Medicare Other | Attending: Oncology | Admitting: Licensed Clinical Social Worker

## 2020-01-20 ENCOUNTER — Other Ambulatory Visit: Payer: Self-pay

## 2020-01-20 ENCOUNTER — Telehealth: Payer: Self-pay

## 2020-01-20 DIAGNOSIS — Z8052 Family history of malignant neoplasm of bladder: Secondary | ICD-10-CM

## 2020-01-20 DIAGNOSIS — Z8042 Family history of malignant neoplasm of prostate: Secondary | ICD-10-CM | POA: Insufficient documentation

## 2020-01-20 DIAGNOSIS — Z85828 Personal history of other malignant neoplasm of skin: Secondary | ICD-10-CM | POA: Diagnosis not present

## 2020-01-20 MED ORDER — TRAZODONE HCL 50 MG PO TABS: 25.0000 mg | ORAL_TABLET | Freq: Every evening | ORAL | 3 refills | Status: DC | PRN

## 2020-01-20 NOTE — Progress Notes (Signed)
REFERRING PROVIDER: Marin Olp, MD [place],  [place] [address]  PRIMARY PROVIDER:  Marin Olp, MD  PRIMARY REASON FOR VISIT:  1. Family history of prostate cancer   2. Family history of bladder cancer      HISTORY OF PRESENT ILLNESS:   Mr. Chacko, a 69 y.o. male, was seen for a [facility] cancer genetics consultation at the request of Dr. Yong Channel due to a family history of cancer and his brother's recent genetic testing that revealed a mutation in KRAS called G12D.  Mr. Friberg presents to clinic today to discuss the possibility of a hereditary predisposition to cancer, genetic testing, and to further clarify his future cancer risks, as well as potential cancer risks for family members.    Mr. Hemenway is a 69 y.o. male with no personal history of cancer aside from a basal cell carcinoma that was removed. He has had 2 normal colonoscopies and has PSA checked regularly, and a dermatologist appointment scheduled for January.    CANCER HISTORY:  Oncology History   No history exists.      Past Medical History:  Diagnosis Date  . ED (erectile dysfunction)   . Elevated liver function tests    2020 labs normal- issues in past   . Family history of bladder cancer   . Family history of prostate cancer   . Hypertension     Past Surgical History:  Procedure Laterality Date  . WRIST GANGLION EXCISION     he reports several cysts in past    Social History   Socioeconomic History  . Marital status: Divorced    Spouse name: Not on file  . Number of children: Not on file  . Years of education: Not on file  . Highest education level: Not on file  Occupational History  . Occupation: semi retired  Tobacco Use  . Smoking status: Former Smoker    Packs/day: 0.20    Years: 2.00    Pack years: 0.40    Quit date: 05/02/1991    Years since quitting: 28.7  . Smokeless tobacco: Never Used  Substance and Sexual Activity  . Alcohol use: Yes    Alcohol/week: 2.0  standard drinks    Types: 2 Standard drinks or equivalent per week  . Drug use: No  . Sexual activity: Yes  Other Topics Concern  . Not on file  Social History Narrative   Divorced. Dating some. 3 children from prior marriage. 4 grandsons. No pets.       Self employeed Materials engineer   Social Determinants of Health   Financial Resource Strain: Low Risk   . Difficulty of Paying Living Expenses: Not hard at all  Food Insecurity: No Food Insecurity  . Worried About Charity fundraiser in the Last Year: Never true  . Ran Out of Food in the Last Year: Never true  Transportation Needs: No Transportation Needs  . Lack of Transportation (Medical): No  . Lack of Transportation (Non-Medical): No  Physical Activity: Insufficiently Active  . Days of Exercise per Week: 2 days  . Minutes of Exercise per Session: 30 min  Stress: No Stress Concern Present  . Feeling of Stress : Only a little  Social Connections: Moderately Integrated  . Frequency of Communication with Friends and Family: More than three times a week  . Frequency of Social Gatherings with Friends and Family: Twice a week  . Attends Religious Services: 1 to 4 times per year  .  Active Member of Clubs or Organizations: Yes  . Attends Archivist Meetings: 1 to 4 times per year  . Marital Status: Divorced     FAMILY HISTORY:  We obtained a detailed, 4-generation family history.  Significant diagnoses are listed below: Family History  Problem Relation Age of Onset  . Coronary artery disease Brother        stent. patient 11 years older than patient.   . Hyperlipidemia Brother   . Stroke Mother        complications after a fall  . Heart attack Father        age 68. smoker  . Prostate cancer Father   . Hypertension Brother   . Cancer Brother        lung? not sure of dx   . Other Brother        airplane crash  . Bladder Cancer Maternal Uncle        dx mid 9s  . Prostate cancer Maternal Uncle   . Heart  defect Son        transposition of arteries   Mr. Farrior has 2 sons and 1 daughter. His youngest son had transposition of arteries as a baby. Patient had 4 brothers. One brother was recently diagnosed with cancer, suspected lung cancer but is unsure exact diagnosis. This brother had genetic testing that showed a KRAS mutation called G12D, which appears to be a somatic mutation but this is unclear as well. Patient does not believe he will be able to get a copy of the testing.   Mr. Lenker mother died at 50. Patient had 5 maternal aunts and 4 maternal uncles. One uncle had bladder cancer in his 74s-60s, and another uncle had prostate cancer. No known cancers in maternal cousins. Maternal grandmother died at 18, grandfather died at 8, no cancers.   Mr. Lederman father died at 49, he had history of prostate cancer but passed due to heart issues. Patient had 4 paternal uncles and 1 paternal aunt, he has very little information about their health history. No known cancers in paternal cousins. Paternal grandfather died at 21, grandmother died in her 1s.  Mr. Wajda is unaware of previous family history of genetic testing for hereditary cancer risks. Patient's maternal ancestors are of unknown descent, and paternal ancestors are of Scottish/European descent. There is no reported Ashkenazi Jewish ancestry. There is no known consanguinity.   GENETIC COUNSELING ASSESSMENT: Mr. Procter is a 69 y.o. male with a family history which is not particularly suggestive of a hereditary cancer syndrome and predisposition to cancer. We, therefore, discussed and recommended the following at today's visit.   DISCUSSION: We discussed that approximately 5 of 10% of cancer is hereditary  Most cases of hereditary prostate cancer are associated with BRCA1/BRCA2 genes, although there are other genes associated with hereditary prostate cancer as well. The KRAS mutation found in his brother sounds likely to be a somatic mutation and not  germline (hereditary), but obtaining a copy of the report for review would be helpful to confirm this. This gene is often included in somatic testing of tumors.  The KRAS gene is not associated with hereditary cancer, but when someone is born with a mutation in this gene they may have Noonan syndrome which is a condition that prevents normal development in various parts of the body including heart defects, short stature, possible developmental delays, etc., but this is not a condition we would test for in the cancer clinic. While Mr. Coey  does not meet criteria for testing, he was still interested in testing. We discussed that testing for hereditary cancer is beneficial for several reasons including  knowing about cancer risks, identifying potential screening and risk-reduction options that may be appropriate, and to understand if other family members could be at risk for cancer and allow them to undergo genetic testing.   We reviewed the characteristics, features and inheritance patterns of hereditary cancer syndromes. We also discussed genetic testing, including the appropriate family members to test, the process of testing, insurance coverage and turn-around-time for results. We discussed the implications of a negative, positive and/or variant of uncertain significant result. We recommended Mr. Mixon pursue genetic testing for the [employer].   The Multi-Cancer Panel offered by [facility] includes sequencing and/or deletion duplication testing of the following 85 genes: AIP, ALK, APC, ATM, AXIN2,BAP1,  BARD1, BLM, BMPR1A, BRCA1, BRCA2, BRIP1, CASR, CDC73, CDH1, CDK4, CDKN1B, CDKN1C, CDKN2A (p14ARF), CDKN2A (p16INK4a), CEBPA, CHEK2, CTNNA1, DICER1, DIS3L2, EGFR (c.2369C>T, p.Thr790Met variant only), EPCAM (Deletion/duplication testing only), FH, FLCN, GATA2, GPC3, GREM1 (Promoter region deletion/duplication testing only), HOXB13 (c.251G>A, p.Gly84Glu), HRAS, KIT, MAX, MEN1, MET, MITF  (c.952G>A, p.Glu318Lys variant only), MLH1, MSH2, MSH3, MSH6, MUTYH, NBN, NF1, NF2, NTHL1, PALB2, PDGFRA, PHOX2B, PMS2, POLD1, POLE, POT1, PRKAR1A, PTCH1, PTEN, RAD50, RAD51C, RAD51D, RB1, RECQL4, RET, RNF43, RUNX1, SDHAF2, SDHA (sequence changes only), SDHB, SDHC, SDHD, SMAD4, SMARCA4, SMARCB1, SMARCE1, STK11, SUFU, TERC, TERT, TMEM127, TP53, TSC1, TSC2, VHL, WRN and WT1.   Based on Mr. Kutzer's family history of cancer, he does not meet medical criteria for genetic testing. He may have an out of pocket cost.   PLAN: After considering the risks, benefits, and limitations, Mr. Blank provided informed consent to pursue genetic testing and the blood sample was sent to [facility] for analysis of the Multi-Cancer Panel. Results should be available within approximately 2-3 weeks' time, at which point they will be disclosed by telephone to Mr. Raymond, as will any additional recommendations warranted by these results. Mr. Oyama will receive a summary of his genetic counseling visit and a copy of his results once available. This information will also be available in Epic.   Mr. Spivack questions were answered to his satisfaction today. Our contact information was provided should additional questions or concerns arise. Thank you for the referral and allowing [place] to share in the care of your patient.   Faith Rogue, MS, [facility] Genetic Counselor [email or web address] Phone: [phone]  The patient was seen for a total of 30 minutes in face-to-face genetic counseling.  Dr. Grayland Ormond was available for discussion regarding this case.   _______________________________________________________________________ For Office Staff:  Number of people involved in session: 1 Was an Intern/ student involved with case: no

## 2020-01-20 NOTE — Telephone Encounter (Signed)
Pt is wanting to know if Dr. Yong Channel can give his son a referral for a cardiologist. He has a heart condition and a pacemaker and they are struggling getting him into a cardiology office without a referral. He is asking for the help of Dr. Yong Channel. He asks that someone call him back and if we could expedite it.

## 2020-01-21 NOTE — Telephone Encounter (Signed)
Could you please find out who pt son is and send this message under the sons chart?

## 2020-02-02 ENCOUNTER — Ambulatory Visit: Payer: Self-pay | Admitting: Licensed Clinical Social Worker

## 2020-02-02 ENCOUNTER — Encounter: Payer: Self-pay | Admitting: Licensed Clinical Social Worker

## 2020-02-02 ENCOUNTER — Telehealth: Payer: Self-pay | Admitting: Licensed Clinical Social Worker

## 2020-02-02 DIAGNOSIS — Z8052 Family history of malignant neoplasm of bladder: Secondary | ICD-10-CM

## 2020-02-02 DIAGNOSIS — Z1379 Encounter for other screening for genetic and chromosomal anomalies: Secondary | ICD-10-CM

## 2020-02-02 DIAGNOSIS — Z8042 Family history of malignant neoplasm of prostate: Secondary | ICD-10-CM

## 2020-02-02 NOTE — Progress Notes (Signed)
HPI:  Mr. Currier was previously seen in the [facility] due to a family history of cancer and concerns regarding a hereditary predisposition to cancer. Please refer to our prior cancer genetics clinic note for more information regarding our discussion, assessment and recommendations, at the time. Mr. Band recent genetic test results were disclosed to him, as were recommendations warranted by these results. These results and recommendations are discussed in more detail below.  CANCER HISTORY:  Oncology History   No history exists.    FAMILY HISTORY:  We obtained a detailed, 4-generation family history.  Significant diagnoses are listed below: Family History  Problem Relation Age of Onset  . Coronary artery disease Brother        stent. patient 55 years older than patient.   . Hyperlipidemia Brother   . Stroke Mother        complications after a fall  . Heart attack Father        age 47. smoker  . Prostate cancer Father   . Hypertension Brother   . Cancer Brother        lung? not sure of dx   . Other Brother        airplane crash  . Bladder Cancer Maternal Uncle        dx mid 25s  . Prostate cancer Maternal Uncle   . Heart defect Son        transposition of arteries   Mr. Desaulniers has 2 sons and 1 daughter. His youngest son had transposition of arteries as a baby. Patient had 4 brothers. One brother was recently diagnosed with cancer, suspected lung cancer but is unsure exact diagnosis. This brother had genetic testing that showed a KRAS mutation called G12D, which appears to be a somatic mutation likely found on a biopsy. Patient does not believe he will be able to get a copy of the testing. UPDATE 10/4: Patient unable to obtain more information about brother's test result, but will reach out if he finds out more/has questions about it.  Mr. Russ mother died at 76. Patient had 5 maternal aunts and 4 maternal uncles. One uncle had bladder cancer in his 54s-60s,  and another uncle had prostate cancer. No known cancers in maternal cousins. Maternal grandmother died at 36, grandfather died at 49, no cancers.   Mr. Niblack father died at 5, he had history of prostate cancer but passed due to heart issues. Patient had 4 paternal uncles and 1 paternal aunt, he has very little information about their health history. No known cancers in paternal cousins. Paternal grandfather died at 26, grandmother died in her 1s.  Mr. Yakubov is unaware of previous family history of genetic testing for hereditary cancer risks. Patient's maternal ancestors are of unknown descent, and paternal ancestors are of Scottish/European descent. There is no reported Ashkenazi Jewish ancestry. There is no known consanguinity.    GENETIC TEST RESULTS: Genetic testing reported out on 01/30/2020 through the [facility] Multi- cancer panel found no pathogenic mutations.   The Multi-Cancer Panel offered by [facility] includes sequencing and/or deletion duplication testing of the following 85 genes: AIP, ALK, APC, ATM, AXIN2,BAP1,  BARD1, BLM, BMPR1A, BRCA1, BRCA2, BRIP1, CASR, CDC73, CDH1, CDK4, CDKN1B, CDKN1C, CDKN2A (p14ARF), CDKN2A (p16INK4a), CEBPA, CHEK2, CTNNA1, DICER1, DIS3L2, EGFR (c.2369C>T, p.Thr790Met variant only), EPCAM (Deletion/duplication testing only), FH, FLCN, GATA2, GPC3, GREM1 (Promoter region deletion/duplication testing only), HOXB13 (c.251G>A, p.Gly84Glu), HRAS, KIT, MAX, MEN1, MET, MITF (c.952G>A, p.Glu318Lys variant only), MLH1, MSH2, MSH3, MSH6,  MUTYH, NBN, NF1, NF2, NTHL1, PALB2, PDGFRA, PHOX2B, PMS2, POLD1, POLE, POT1, PRKAR1A, PTCH1, PTEN, RAD50, RAD51C, RAD51D, RB1, RECQL4, RET, RNF43, RUNX1, SDHAF2, SDHA (sequence changes only), SDHB, SDHC, SDHD, SMAD4, SMARCA4, SMARCB1, SMARCE1, STK11, SUFU, TERC, TERT, TMEM127, TP53, TSC1, TSC2, VHL, WRN and WT1.   The test report has been scanned into EPIC and is located under the Molecular Pathology section of the Results Review tab.  A  portion of the result report is included below for reference.     We discussed with Mr. Talmadge that because current genetic testing is not perfect, it is possible there may be a gene mutation in one of these genes that current testing cannot detect, but that chance is small.  We also discussed, that there could be another gene that has not yet been discovered, or that we have not yet tested, that is responsible for the cancer diagnoses in the family. It is also possible there is a hereditary cause for the cancer in the family that Mr. Sandall did not inherit and therefore was not identified in his testing.  Therefore, it is important to remain in touch with cancer genetics in the future so that we can continue to offer Mr. Knudsen the most up to date genetic testing.   Genetic testing did identify a variant of uncertain significance (VUS) in the POLE gene called c.665G>A.  At this time, it is unknown if this variant is associated with increased cancer risk or if this is a normal finding, but most variants such as this get reclassified to being inconsequential. It should not be used to make medical management decisions. With time, we suspect the lab will determine the significance of this variant, if any. If we do learn more about it, we will try to contact Mr. Graffius to discuss it further. However, it is important to stay in touch with [place] periodically and keep the address and phone number up to date.  ADDITIONAL GENETIC TESTING: We discussed with Mr. Morgenthaler that his genetic testing was fairly extensive.  If there are genes identified to increase cancer risk that can be analyzed in the future, we would be happy to discuss and coordinate this testing at that time.    CANCER SCREENING RECOMMENDATIONS: Mr. Tomb test result is considered negative (normal).  This means that we have not identified a hereditary cause for his  family history of cancer at this time.     While reassuring, this does not definitively  rule out a hereditary predisposition to cancer. It is still possible that there could be genetic mutations that are undetectable by current technology. There could be genetic mutations in genes that have not been tested or identified to increase cancer risk.  Therefore, it is recommended he continue to follow the cancer management and screening guidelines provided by his primary healthcare provider.   An individual's cancer risk and medical management are not determined by genetic test results alone. Overall cancer risk assessment incorporates additional factors, including personal medical history, family history, and any available genetic information that may result in a personalized plan for cancer prevention and surveillance.  RECOMMENDATIONS FOR FAMILY MEMBERS:  Relatives in this family might be at some increased risk of developing cancer, over the general population risk, simply due to the family history of cancer.  We recommended male relatives in this family have a yearly mammogram beginning at age 73, or 23 years younger than the earliest onset of cancer, an annual clinical breast exam, and perform monthly  breast self-exams. Male relatives in this family should also have a gynecological exam as recommended by their primary provider.  All family members should be referred for colonoscopy starting at age 88.   FOLLOW-UP: Lastly, we discussed with Mr. Reynolds that cancer genetics is a rapidly advancing field and it is possible that new genetic tests will be appropriate for him and/or his family members in the future. We encouraged him to remain in contact with cancer genetics on an annual basis so we can update his personal and family histories and let him know of advances in cancer genetics that may benefit this family.   Our contact number was provided. Mr. Cordner questions were answered to his satisfaction, and he knows he is welcome to call us at anytime with additional questions or concerns.    Faith Rogue, MS, [facility] Genetic Counselor [email or web address] Phone: [phone]

## 2020-02-02 NOTE — Telephone Encounter (Signed)
Revealed negative genetic testing.  Revealed that a VUS in POLE was identified. This normal result is reassuring. It is unlikely that there is an increased risk of  cancer due to a mutation in one of these genes.  However, genetic testing is not perfect, and cannot definitively rule out a hereditary cause.  It will be important for him to keep in contact with genetics to learn if any additional testing may be needed in the future.

## 2020-03-11 ENCOUNTER — Telehealth: Payer: Self-pay | Admitting: Family Medicine

## 2020-03-11 NOTE — Chronic Care Management (AMB) (Signed)
  Chronic Care Management   Note  03/11/2020 Name: CLEMENTS TORO MRN: [id number] DOB: 28-Jan-1951  RONALD LONDO is a 69 y.o. year old male who is a primary care patient of Marin Olp, MD. I reached out to [name] by phone today in response to a referral sent by Mr. SIMMIE CAMERER PCP, Marin Olp, MD.   Mr. Wexler was given information about Chronic Care Management services today including:  1. CCM service includes personalized support from designated clinical staff supervised by his physician, including individualized plan of care and coordination with other care providers 2. 24/7 contact phone numbers for assistance for urgent and routine care needs. 3. Service will only be billed when office clinical staff spend 20 minutes or more in a month to coordinate care. 4. Only one practitioner may furnish and bill the service in a calendar month. 5. The patient may stop CCM services at any time (effective at the end of the month) by phone call to the office staff.   Patient agreed to services and verbal consent obtained.   Follow up plan:   Lauretta Grill Upstream Scheduler

## 2020-04-09 DIAGNOSIS — Z23 Encounter for immunization: Secondary | ICD-10-CM | POA: Diagnosis not present

## 2020-04-26 ENCOUNTER — Telehealth: Payer: Self-pay

## 2020-04-26 NOTE — Progress Notes (Incomplete)
    Chronic Care Management Pharmacy Assistant   Name: Dillon [name]  MRN: [id number] DOB: 10-01-1950  Reason for Encounter: Medication Review/ Initial Visit   Dillon [name],  69 y.o. , male presents for their Initial CCM visit with the clinical pharmacist In office.  PCP : Dillon Majestic, MD  Allergies:  No Known Allergies  Medications: Outpatient Encounter Medications as of 04/26/2020  Medication Sig Note  . aspirin 81 MG tablet Take 81 mg by mouth daily.     Marland Kitchen doxycycline (VIBRA-TABS) 100 MG tablet Take 1 tablet (100 mg total) by mouth 2 (two) times daily.   Marland Kitchen latanoprost (XALATAN) 0.005 % ophthalmic solution Place 1 drop into both eyes at bedtime.     Marland Kitchen losartan (COZAAR) 100 MG tablet TAKE ONE TABLET BY MOUTH DAILY   . omeprazole (PRILOSEC) 20 MG capsule TAKE ONE CAPSULE BY MOUTH DAILY   . rosuvastatin (CRESTOR) 5 MG tablet Take 1 tablet (5 mg total) by mouth once a week.   . Sildenafil Citrate (VIAGRA PO) Take by mouth. 08/10/2016: Prn   . traZODone (DESYREL) 50 MG tablet Take 0.5-1 tablets (25-50 mg total) by mouth at bedtime as needed for sleep.    No facility-administered encounter medications on file as of 04/26/2020.    Current Diagnosis: Patient Active Problem List   Diagnosis Date Noted  . Genetic testing 02/02/2020  . Family history of prostate cancer   . Family history of bladder cancer   . Family history of genetic disease 11/26/2019  . Hyperlipidemia, unspecified 05/06/2018  . Insomnia 05/06/2018  . GERD (gastroesophageal reflux disease) 05/06/2018  . Former smoker 05/06/2018  . Diabetes mellitus without complication (HCC) 09/05/2017  . Morbid obesity (HCC) 05/09/2016  . OSA (obstructive sleep apnea) 11/30/2015  . Elevated PSA 08/25/2015  . Mild stage glaucoma(365.71) 02/12/2011  . Venous tributary (branch) occlusion of retina 02/12/2011  . ERECTILE DYSFUNCTION 05/07/2008  . Essential hypertension 05/16/2007   Have you seen any other providers since  your last visit? **{YES NO:22349}  Any changes in your medications or health? {YES NO:22349}  Any side effects from any medications? {YES NO:22349}  Do you have an symptoms or problems not managed by your medications? {YES NO:22349}  Any concerns about your health right now? {YES NO:22349}  Has your provider asked that you check blood pressure, blood sugar, or follow special diet at home? {YES NO:22349}  Do you get any type of exercise on a regular basis? {YES NO:22349}  Can you think of a goal you would like to reach for your health? ***  Do you have any problems getting your medications? {YES NO:22349}  Is there anything that you would like to discuss during the appointment? ***  Please bring medications and supplements to appointment [name] ,[facility] Clinical Pharmacist Assistant [phone]   Follow-Up:  Pharmacist Review

## 2020-04-27 ENCOUNTER — Ambulatory Visit: Payer: Medicare Other

## 2020-05-10 ENCOUNTER — Telehealth: Payer: Self-pay | Admitting: Family Medicine

## 2020-05-10 ENCOUNTER — Ambulatory Visit: Payer: Medicare Other

## 2020-05-10 ENCOUNTER — Other Ambulatory Visit: Payer: Self-pay

## 2020-05-10 DIAGNOSIS — K219 Gastro-esophageal reflux disease without esophagitis: Secondary | ICD-10-CM

## 2020-05-10 DIAGNOSIS — E119 Type 2 diabetes mellitus without complications: Secondary | ICD-10-CM

## 2020-05-10 DIAGNOSIS — I10 Essential (primary) hypertension: Secondary | ICD-10-CM

## 2020-05-10 DIAGNOSIS — E785 Hyperlipidemia, unspecified: Secondary | ICD-10-CM

## 2020-05-10 NOTE — Patient Instructions (Signed)
Dillon [name],  Thank you for taking the time to review your medications with me today.  I have included our care plan/goals in the following pages. Please review and call me at [phone] with any questions!  Thanks! Ellin Mayhew, Pharm.D., BCGP Clinical Pharmacist [facility] [phone]  Goals Addressed            This Visit's Progress   . PharmD Care Plan       CARE PLAN ENTRY (see longitudinal plan of care for additional care plan information)  Current Barriers:  . Chronic Disease Management support, education, and care coordination needs related to Hypertension, Hyperlipidemia, Diabetes, and GERD   Hypertension BP Readings from Last 3 Encounters:  12/13/19 122/79  11/26/19 (!) 132/80  05/16/19 132/86   . Pharmacist Clinical Goal(s): o Over the next 365 days, patient will work with PharmD and providers to maintain BP goal <130/80 . Current regimen:  o Losartan 100 mg once daily . Interventions: o Reviewed diet/exercise - Maintain a healthy weight and exercise regularly, as directed by your health care provider. Eat healthy foods, such as: Lean proteins, complex carbohydrates, fresh fruits and vegetables, low-fat dairy products, healthy fats. . Patient self care activities - Over the next 365 days, patient will: o Check BP at least once every 1-2 weeks, document, and provide at future appointments o Ensure daily salt intake < 2300 mg/day  Hyperlipidemia Lab Results  Component Value Date/Time   LDLCALC 90 11/08/2018 09:11 AM   LDLDIRECT 120.0 05/16/2019 08:42 AM   . Pharmacist Clinical Goal(s): o Over the next 365 days, patient will work with PharmD and providers to achieve LDL goal < 70 . Current regimen:  o Rosuvastatin 5 mg once WEEKLY  . Interventions: o We reviewed side effects - no issues noted with side effects at present. o Coordinated scheduling for 6 month f/u with PCP.  Marland Kitchen Patient self care activities - Over the  next 365 days, patient will: o Continue current management  Diabetes Lab Results  Component Value Date/Time   HGBA1C 6.4 (H) 11/26/2019 08:25 AM   HGBA1C 6.6 (H) 05/16/2019 08:42 AM   . Pharmacist Clinical Goal(s): o Over the next 180 days, patient will work with PharmD and providers to maintain A1c goal <7% . Current regimen:  o No current medications  . Interventions: o Focused on lifestyle modifications - plan to get back to walking 30 minutes a day . Patient self care activities - Over the next 180 days, patient will: o Check blood sugar if directed, document, and provide at future appointments  GERD (acid reflux) . Pharmacist Clinical Goal(s) o Over the next 180 days, patient will work with PharmD and providers to minimize symptoms of acid reflux . Current regimen:  o Omeprazole 20 mg once daily . Interventions: o Consider use of pepcid 10 mg twice daily as needed if omeprazole is not being used consistently o We discussed avoidance of potential triggers such as alcohol, fatty foods, lying down after eating, and tomato sauce. . Patient self care activities - Over the next 365 days, patient will: o Reach out with any questions o Focus on avoidance of dietary triggers  Medication management . Pharmacist Clinical Goal(s): o Over the next 365 days, patient will work with PharmD and providers to maintain optimal medication adherence . [facility] . Interventions o Comprehensive medication review performed. o Continue current medication management strategy . Patient self care  activities - Over the next 365 days, patient will: o Take medications as prescribed o Report any questions or concerns to PharmD and/or provider(s) Initial goal documentation.      The patient verbalized understanding of instructions provided today and agreed to receive a my chart copy of patient instruction and/or educational materials. Telephone follow up appointment with  pharmacy team member scheduled for: See next appointment with "Care Management Staff" under "What's Next" below.   Dillon [name] was given information about Chronic Care Management services today including:  1. CCM service includes personalized support from designated clinical staff supervised by his physician, including individualized plan of care and coordination with other care providers 2. 24/7 contact phone numbers for assistance for urgent and routine care needs. 3. Standard insurance, coinsurance, copays and deductibles apply for chronic care management only during months in which we provide at least 20 minutes of these services. Most insurances cover these services at 100%, however patients may be responsible for any copay, coinsurance and/or deductible if applicable. This service may help you avoid the need for more expensive face-to-face services. 4. Only one practitioner may furnish and bill the service in a calendar month. 5. The patient may stop CCM services at any time (effective at the end of the month) by phone call to the office staff.  Patient agreed to services and verbal consent obtained.   Madelin Rear, Pharm.D., BCGP Clinical Pharmacist [facility] [phone]

## 2020-05-10 NOTE — Progress Notes (Signed)
Chronic Care Management Pharm[facility] Name: [facility]     MRN: [id number]     DOB: 02-17-1951  Chief Complaint/ HPI Dillon [name], 70 y.o., male, presents for their initial CCM visit with the clinical pharmacist in office.  PCP: Marin Olp, MD Encounter Diagnoses  Name Primary?  . Essential hypertension Yes  . Gastroesophageal reflux disease without esophagitis   . Hyperlipidemia, unspecified hyperlipidemia type   . Diabetes mellitus without complication [facility])     Patient Active Problem List   Diagnosis Date Noted  . Genetic testing 02/02/2020  . Family history of prostate cancer   . Family history of bladder cancer   . Family history of genetic disease 11/26/2019  . Hyperlipidemia, unspecified 05/06/2018  . Insomnia 05/06/2018  . GERD (gastroesophageal reflux disease) 05/06/2018  . Former smoker 05/06/2018  . Diabetes mellitus without complication ([place]) 75/88/3254  . Morbid obesity ([place]) 05/09/2016  . OSA (obstructive sleep apnea) 11/30/2015  . Elevated PSA 08/25/2015  . Mild stage glaucoma(365.71) 02/12/2011  . Venous tributary (branch) occlusion of retina 02/12/2011  . ERECTILE DYSFUNCTION 05/07/2008  . Essential hypertension 05/16/2007   Past Surgical History:  Procedure Laterality Date  . WRIST GANGLION EXCISION     he reports several cysts in past   Family History  Problem Relation Age of Onset  . Coronary artery disease Brother        stent. patient 15 years older than patient.   . Hyperlipidemia Brother   . Stroke Mother        complications after a fall  . Heart attack Father        age 13. smoker  . Prostate cancer Father   . Hypertension Brother   . Cancer Brother        lung? not sure of dx   . Other Brother        airplane crash  . Bladder Cancer Maternal Uncle        dx mid 55s  . Prostate cancer Maternal Uncle   . Heart defect Son        transposition of arteries   Social History   Social History Narrative   Divorced. Dating some. 3  children from prior marriage. 4 grandsons. No pets.       Self employeed structural engineer   No Known Allergies Outpatient Encounter Medications as of 05/10/2020  Medication Sig Note  . aspirin 81 MG tablet Take 81 mg by mouth daily.     Marland Kitchen latanoprost (XALATAN) 0.005 % ophthalmic solution Place 1 drop into both eyes at bedtime.     Marland Kitchen losartan (COZAAR) 100 MG tablet TAKE ONE TABLET BY MOUTH DAILY   . omeprazole (PRILOSEC) 20 MG capsule TAKE ONE CAPSULE BY MOUTH DAILY   . rosuvastatin (CRESTOR) 5 MG tablet Take 1 tablet (5 mg total) by mouth once a week.   . Sildenafil Citrate (VIAGRA PO) Take by mouth. 08/10/2016: Prn   . traZODone (DESYREL) 50 MG tablet Take 0.5-1 tablets (25-50 mg total) by mouth at bedtime as needed for sleep.   . [DISCONTINUED] doxycycline (VIBRA-TABS) 100 MG tablet Take 1 tablet (100 mg total) by mouth 2 (two) times daily.    No facility-administered encounter medications on file as of 05/10/2020.   Patient Care Team    Relationship Specialty Notifications Start End  Marin Olp, MD PCP - General Family Medicine  05/06/18   Madelin Rear, [facility] Pharmacist Pharmacist  03/11/20    Comment: [phone]  Current Diagnosis/Assessment: Goals Addressed            This Visit's Progress   . PharmD Care Plan       CARE PLAN ENTRY (see longitudinal plan of care for additional care plan information)  Current Barriers:  . Chronic Disease Management support, education, and care coordination needs related to Hypertension, Hyperlipidemia, Diabetes, and GERD   Hypertension BP Readings from Last 3 Encounters:  12/13/19 122/79  11/26/19 (!) 132/80  05/16/19 132/86   . Pharmacist Clinical Goal(s): o Over the next 365 days, patient will work with PharmD and providers to maintain BP goal <130/80 . Current regimen:  o Losartan 100 mg once daily . Interventions: o Reviewed diet/exercise - Maintain a healthy weight and exercise regularly, as directed by your health care  provider. Eat healthy foods, such as: Lean proteins, complex carbohydrates, fresh fruits and vegetables, low-fat dairy products, healthy fats. . Patient self care activities - Over the next 365 days, patient will: o Check BP at least once every 1-2 weeks, document, and provide at future appointments o Ensure daily salt intake < 2300 mg/day  Hyperlipidemia Lab Results  Component Value Date/Time   LDLCALC 90 11/08/2018 09:11 AM   LDLDIRECT 120.0 05/16/2019 08:42 AM   . Pharmacist Clinical Goal(s): o Over the next 365 days, patient will work with PharmD and providers to achieve LDL goal < 70 . Current regimen:  o Rosuvastatin 5 mg once WEEKLY  . Interventions: o We reviewed side effects - no issues noted with side effects at present. o Coordinated scheduling for 6 month f/u with PCP.  Marland Kitchen Patient self care activities - Over the next 365 days, patient will: o Continue current management  Diabetes Lab Results  Component Value Date/Time   HGBA1C 6.4 (H) 11/26/2019 08:25 AM   HGBA1C 6.6 (H) 05/16/2019 08:42 AM   . Pharmacist Clinical Goal(s): o Over the next 180 days, patient will work with PharmD and providers to maintain A1c goal <7% . Current regimen:  o No current medications  . Interventions: o Focused on lifestyle modifications - plan to get back to walking 30 minutes a day . Patient self care activities - Over the next 180 days, patient will: o Check blood sugar if directed, document, and provide at future appointments  GERD (acid reflux) . Pharmacist Clinical Goal(s) o Over the next 180 days, patient will work with PharmD and providers to minimize symptoms of acid reflux . Current regimen:  o Omeprazole 20 mg once daily . Interventions: o Consider use of pepcid 10 mg twice daily as needed if omeprazole is not being used consistently o We discussed avoidance of potential triggers such as alcohol, fatty foods, lying down after eating, and tomato sauce. . Patient self care  activities - Over the next 365 days, patient will: o Reach out with any questions o Focus on avoidance of dietary triggers  Medication management . Pharmacist Clinical Goal(s): o Over the next 365 days, patient will work with PharmD and providers to maintain optimal medication adherence . [facility] . Interventions o Comprehensive medication review performed. o Continue current medication management strategy . Patient self care activities - Over the next 365 days, patient will: o Take medications as prescribed o Report any questions or concerns to PharmD and/or provider(s) Initial goal documentation.      Hypertension   BP goal <130/80  BP Readings from Last 3 Encounters:  12/13/19 122/79  11/26/19 (!) 132/80  05/16/19 132/86  BMP Latest Ref Rng & Units 11/26/2019 05/16/2019 11/08/2018  Glucose 65 - 99 mg/dL 115(H) 117(H) 109(H)  BUN 7 - 25 mg/dL '18 18 23  ' Creatinine 0.70 - 1.25 mg/dL 1.24 1.15 1.26  BUN/Creat Ratio 6 - 22 (calc) NOT APPLICABLE - -  Sodium 720 - 146 mmol/L 134(L) 135 135  Potassium 3.5 - 5.3 mmol/L 4.3 4.3 4.7  Chloride 98 - 110 mmol/L 101 100 102  CO2 20 - 32 mmol/L '26 27 25  ' Calcium 8.6 - 10.3 mg/dL 9.6 10.0 9.5   Previous medications: lisinopril 20 mg, olmesartan 20 mg. Cough on losartan however improved with PPI. Patient checks BP at home several times per month . 120s/80s on avg. Patient is currently at goal on the following medications:  . Losartan 100 mg once daily   We discussed - diet/exercise - Maintain a healthy weight and exercise regularly, as directed by your health care provider. Eat healthy foods, such as: Lean proteins, complex carbohydrates, fresh fruits and vegetables, low-fat dairy products, healthy fats.  Plan  Continue current medications.  Diabetes   A1c goal < 7%  Lab Results  Component Value Date/Time   HGBA1C 6.4 (H) 11/26/2019 08:25 AM   HGBA1C 6.6 (H) 05/16/2019 08:42 AM   HGBA1C 6.5 11/08/2018  09:11 AM   GFR 63.43 05/16/2019 08:42 AM   GFR 57.17 (L) 11/08/2018 09:11 AM   GFR 64.52 09/04/2017 09:10 AM   GFR 66.65 08/14/2016 07:59 AM   GFR 71.81 07/27/2015 09:47 AM    Previous medications: n/a.  2 meals per day - breakfast and late lunch. Some fruit/vegetable (potatoes, corn). Trying to eat more greens. Active with work outdoors. Now getting back to walking 30 minutes a day. Recent FBG readings: n/a. Patient is currently at goal on: . n/a  Plan  Continue current management.  Hyperlipidemia   LDL goal < 70  Lipid Panel     Component Value Date/Time   CHOL 170 05/16/2019 0842   TRIG 206.0 (H) 05/16/2019 0842   HDL 33.30 (L) 05/16/2019 0842   LDLCALC 90 11/08/2018 0911   LDLDIRECT 120.0 05/16/2019 0842    Hepatic Function Latest Ref Rng & Units 11/26/2019 05/16/2019 11/08/2018  Total Protein 6.1 - 8.1 g/dL 7.1 7.3 7.4  Albumin 3.5 - 5.2 g/dL - 4.5 4.6  AST 10 - 35 U/L 28 30 33  ALT 9 - 46 U/L 29 35 38  Alk Phosphatase 39 - 117 U/L - 61 62  Total Bilirubin 0.2 - 1.2 mg/dL 0.7 0.8 0.8  Bilirubin, Direct 0.0 - 0.3 mg/dL - - -    The 10-year ASCVD risk score Mikey Bussing DC Jr., et al., 2013) is: 31.1%   Values used to calculate the score:     Age: 15 years     Sex: Male     Is Non-Hispanic African American: No     Diabetic: Yes     Tobacco smoker: No     Systolic Blood Pressure: 721 mmHg     Is BP treated: Yes     HDL Cholesterol: 33.3 mg/dL     Total Cholesterol: 170 mg/dL   Previous medications: rosuvastatin 10 mg encouraged upon initial DM dgx. Rosuvastatin 27m started 10/2019.  Previous ldl not at goal. Denies side effects, statin started 10/2019 - needs labs/PCP f/u  Rosuvastatin 5 mg once WEEKLY  Primary prevention. HTN, new DM dgx, former smoker (only 0.4 pack years). Denies any abnormal bruising, bleeding from nose or gums or blood in urine  or stool. Patient is currently controlled on the following medications:  . Aspirin 81 mg once daily   We reviewed  side effects - no issues noted with side effects at present. Coordinated scheduling for 6 month f/u with PCP.   Plan  Continue current medications.  GERD   Lab Results  Component Value Date   VITAMINB12 338 11/26/2019   Previous medications: will consider stepdown to pepcid 6-12 months from 10/2019.  Not taking omeprazole routinely, currently uses as needed. Patient denies recent acid reflux but previous triggers have been eating late at night or lying down after eating. Currently taking: . Omeprazole 20 mg once daily   We discussed avoidance of potential triggers such as alcohol, fatty foods, lying down after eating, and tomato sauce.  Plan    Pt to consider trial with pepcid 10 mg once to twice daily due to omeprazole not being used routinely.  Insomnia   Previous medications: zolpidem 10 mg 03/2016-08/2016.   Denies side effects, reports ongoing benefit of taking medication. Patient is currently controlled on the following medications:  . Trazodone 50 mg tablet - 0.5-1 tablet (25-50 mg) at bedtime as needed for sleep  Plan  Continue current medications.  Vaccines   Immunization History  Administered Date(s) Administered  . Fluad Quad(high Dose 65+) 05/16/2019  . PFIZER SARS-COV-2 Vaccination 06/14/2019, 07/09/2019, 04/09/2020  . Pneumococcal Conjugate-13 11/26/2019  . Tdap 03/06/2013   Reviewed and discussed patient's vaccination history. Has received pfizer booster shot at [facility]. Plans to receive flu shot and shingrix at pharmacy.  04/09/20 - confirmed receipt of pfizer booster with [facility]  Recommended patient receive flu and shingles shot at pharmacy.   Medication Management / Care Coordination   Receives prescription medications from:  [facility], [place] [address] Phone: [phone] Fax: [phone]   Denies any issues with current medication  management.  Due for 6 month HLD/HTN f/u with PCP - pt scheduled for 06/16/2020.  Plan  Continue current medication management strategy. ___________________________ SDOH (Social Determinants of Health) assessments performed: Yes. Future Appointments  Date Time Provider [place]  05/19/2020 10:00 AM Lavonna Monarch, MD [facility]  06/16/2020  8:40 AM Marin Olp, MD [facility]  11/09/2020  1:30 PM [facility] CCM PHARMACIST [facility]  11/18/2020  8:00 AM [facility] HEALTH COACH [facility]   Visit follow-up:  . CPA follow-up: n/a at this time. Marland Kitchen RPH follow-up: 6-8 month f/u telephone visit. HTN, HLD. Newer DM dgx - was really going to focus on dietary/lifestyle  Madelin Rear, Pharm.D., BCGP Clinical Pharmacist [facility] [phone]

## 2020-05-19 ENCOUNTER — Ambulatory Visit (INDEPENDENT_AMBULATORY_CARE_PROVIDER_SITE_OTHER): Payer: Medicare Other | Admitting: Dermatology

## 2020-05-19 ENCOUNTER — Encounter: Payer: Self-pay | Admitting: Dermatology

## 2020-05-19 ENCOUNTER — Other Ambulatory Visit: Payer: Self-pay

## 2020-05-19 DIAGNOSIS — L821 Other seborrheic keratosis: Secondary | ICD-10-CM | POA: Diagnosis not present

## 2020-05-19 DIAGNOSIS — Z85828 Personal history of other malignant neoplasm of skin: Secondary | ICD-10-CM

## 2020-05-19 DIAGNOSIS — Z1283 Encounter for screening for malignant neoplasm of skin: Secondary | ICD-10-CM | POA: Diagnosis not present

## 2020-05-19 DIAGNOSIS — D1801 Hemangioma of skin and subcutaneous tissue: Secondary | ICD-10-CM | POA: Diagnosis not present

## 2020-05-19 DIAGNOSIS — I781 Nevus, non-neoplastic: Secondary | ICD-10-CM

## 2020-05-22 ENCOUNTER — Encounter: Payer: Self-pay | Admitting: Dermatology

## 2020-05-22 NOTE — Progress Notes (Signed)
   New Patient   Subjective  Dillon [name] is a 70 y.o. male who presents for the following: New Patient (Initial Visit) (Patient here today for skin check, patient does have a place on his right hand x years per patient changing color. History of BCC removed in 2008 or 2009 left shoulder.).  General skin examination Location:  Duration:  Quality:  Associated Signs/Symptoms: Modifying Factors:  Severity:  Timing: Context: History of basal cell carcinomas   The following portions of the chart were reviewed this encounter and updated as appropriate:  Tobacco  Allergies  Meds  Problems  Med Hx  Surg Hx  Fam Hx      Objective  Well appearing patient in no apparent distress; mood and affect are within normal limits. Objective  Left Shoulder - Anterior: Elongated, slightly wide scar previous exc =mohs Dr Winona Legato? Referral done by [facility] (scar clear)  Objective  Chest - Medial [facility]): Head to toe skin examination, no atypical moles, melanoma, or new nonmelanoma skin cancer.  Objective  Left Breast: Half dozen light brown textured flattopped 3 to 7 mm papules  Objective  Right Forearm - Anterior: Smooth red 3 mm papule   A full examination was performed including scalp, head, eyes, ears, nose, lips, neck, chest, axillae, abdomen, back, buttocks, bilateral upper extremities, bilateral lower extremities, hands, feet, fingers, toes, fingernails, and toenails. All findings within normal limits unless otherwise noted below.   Assessment & Plan  History of basal cell cancer Left Shoulder - Anterior  Recheck as needed change  Screening exam for skin cancer Chest - Medial [facility])  Annual skin examination, encouraged to examine his own skin twice yearly.  Seborrheic keratosis Left Breast  Leave if stable  Cherry angioma Right Forearm - Anterior  Leave of stable

## 2020-06-15 NOTE — Progress Notes (Signed)
Phone [phone] In person visit   Subjective:   Dillon [name] is a 70 y.o. year old very pleasant male patient who presents for/with See problem oriented charting Chief Complaint  Patient presents with   Hypertension   Hyperlipidemia   Gastroesophageal Reflux   This visit occurred during the SARS-CoV-2 public health emergency.  Safety protocols were in place, including screening questions prior to the visit, additional usage of staff PPE, and extensive cleaning of exam room while observing appropriate contact time as indicated for disinfecting solutions.   Past Medical History-  Patient Active Problem List   Diagnosis Date Noted   Diabetes mellitus without complication ([place]) 24/40/1027    Priority: High   Hyperlipidemia, unspecified 05/06/2018    Priority: Medium   GERD (gastroesophageal reflux disease) 05/06/2018    Priority: Medium   Former smoker 05/06/2018    Priority: Medium   OSA (obstructive sleep apnea) 11/30/2015    Priority: Medium   Elevated PSA 08/25/2015    Priority: Medium   Mild stage glaucoma(365.71) 02/12/2011    Priority: Medium   Venous tributary (branch) occlusion of retina 02/12/2011    Priority: Medium   Essential hypertension 05/16/2007    Priority: Medium   Insomnia 05/06/2018    Priority: Low   ERECTILE DYSFUNCTION 05/07/2008    Priority: Low   Genetic testing 02/02/2020   Family history of prostate cancer    Family history of bladder cancer    Family history of genetic disease 11/26/2019   Morbid obesity ([place]) 05/09/2016    Medications- reviewed and updated Current Outpatient Medications  Medication Sig Dispense Refill   aspirin 81 MG tablet Take 81 mg by mouth daily.     Clotrimazole 1 % OINT Use twice a day for 1 week for balanitis 56.7 g 0   latanoprost (XALATAN) 0.005 % ophthalmic solution Place 1 drop into both eyes at bedtime.     losartan (COZAAR) 100 MG tablet TAKE ONE TABLET BY MOUTH DAILY 90 tablet 3    omeprazole (PRILOSEC) 20 MG capsule TAKE ONE CAPSULE BY MOUTH DAILY 90 capsule 3   rosuvastatin (CRESTOR) 5 MG tablet Take 1 tablet (5 mg total) by mouth once a week. 13 tablet 3   Sildenafil Citrate (VIAGRA PO) Take by mouth.     traZODone (DESYREL) 50 MG tablet Take 0.5-1 tablets (25-50 mg total) by mouth at bedtime as needed for sleep. 30 tablet 3     Objective:  BP 130/80    Pulse 79    Temp 97.7 F (36.5 C) (Temporal)    Ht 5\' 11"  (1.803 m)    Wt 259 lb 12.8 oz (117.8 kg)    SpO2 100%    BMI 36.23 kg/m  Gen: NAD, resting comfortably CV: RRR no murmurs rubs or gallops Lungs: CTAB no crackles, wheeze, rhonchi Abdomen: soft/nontender/nondistended/normal bowel sounds. Ext: no edema Skin: warm, dry    Assessment and Plan   #social history-  lost brother- had battled stage IV small cell lung cancer for about a year and died earlier this year. 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 He may retire at [age] instead of 56. His goal is watching all his grandkids graduations- hed have to be 82.   #Right hip pain- sees Dr. Lyla Glassing on Monday with [facility]  # Rash on head of penis - no sex in 2-3 years -uncircumcised  -has tried an OTC yeast cream- thinsk possible econazole -has seen Dr. [place] Crane in [place] in  past with [facility]- has followed for elevated PSA  -looks like balanitis "Attention to genital hygiene is the most important approach for most men with balanitis. Retraction of the foreskin with thorough genital cleansing can be both preventive and therapeutic. Twice-daily bathing of the affected area with saline solution should be encouraged  After you dry this off well- use clotrimazole twice a week  Your foreskin is connecting some to penis head - I would talk with your new urologist"  # Diabetes/morbid obesity with DM, HTN, HLD and BMI >35 S: Medication:none  CBGs-  Does not have meter Exercise and diet-  weight stable from last visit. Stagnant lately with  loss of brother/caregiving- plans to improve exercise. Has bene trying to maintain weight but knows he needs to lose Lab Results  Component Value Date   HGBA1C 6.4 (H) 11/26/2019   HGBA1C 6.6 (H) 05/16/2019   HGBA1C 6.5 11/08/2018   A/P: hopefully stable- update a1c.  For obesity- Encouraged need for healthy eating, regular exercise, weight loss.   #hyperlipidemia S: Medication:Rosuvastatin 5Mg  once a week Lab Results  Component Value Date   CHOL 170 05/16/2019   HDL 33.30 (L) 05/16/2019   LDLCALC 90 11/08/2018   LDLDIRECT 120.0 05/16/2019   TRIG 206.0 (H) 05/16/2019   CHOLHDL 5 05/16/2019   A/P: check Direct LDL with labs- would lov eto have this at least under 100 though under 70 more idea  # GERD S:Medication: Prilosec 20Mg - helps with cough B12 levels related to PPI use: low normal- advised to take at least once a week with rosuvastatin.  A/P: Stable. Continue current medications.   - discussed trying pepcid after some weight loss  #hypertension S: medication: Losartan 100Mg . Cough at times on this- also discussed switch to other ARB- but symptoms improved on PPI and CXR reassuring in past.  BP Readings from Last 3 Encounters:  06/16/20 130/80  12/13/19 122/79  11/26/19 (!) 132/80  A/P: Stable. Continue current medications.   Recommended follow up: Return in about 4 months (around 10/14/2020) for physical or sooner if needed. Future Appointments  Date Time Provider [place]  11/09/2020  1:30 PM [facility] CCM PHARMACIST [facility]  11/18/2020  8:00 AM [facility] HEALTH COACH [facility]  05/23/2021  7:30 AM Lavonna Monarch, MD [facility]   Lab/Order associations:   ICD-10-CM   1. Essential hypertension  I10   2. Gastroesophageal reflux disease without esophagitis  K21.9   3. Diabetes mellitus without complication (HCC)  T05.6 Hemoglobin A1c    CBC with Differential/Platelet    Comprehensive metabolic panel    LDL cholesterol, direct  4. Hyperlipidemia,  unspecified hyperlipidemia type  E78.5   5. Morbid obesity ([place])  E66.01    Meds ordered this encounter  Medications   Clotrimazole 1 % OINT    Sig: Use twice a day for 1 week for balanitis    Dispense:  56.7 g    Refill:  0   Return precautions advised.  Garret Reddish, MD

## 2020-06-15 NOTE — Patient Instructions (Incomplete)
  Health Maintenance Due  Topic Date Due  . OPHTHALMOLOGY EXAM - at next visit make sure they know you have diabetes and send me a diabetic eye exam copy Never done   No changes today unless labs lead [place] to make changes  Other than try to work on getting up to 150 minutes a week exercise and lets set a goal of perhaps 5 lbs down by follow up  Attention to genital hygiene is the most important approach for most men with balanitis. Retraction of the foreskin with thorough genital cleansing can be both preventive and therapeutic. Twice-daily bathing of the affected area with saline solution should be encouraged  After you dry this off well- use clotrimazole twice a week  Your foreskin is connecting some to penis head - I would talk with your new urologist  Please stop by lab before you go If you have mychart- we will send your results within 3 business days of [place] receiving them.  If you do not have mychart- we will call you about results within 5 business days of [place] receiving them.  *please also note that you will see labs on mychart as soon as they post. I will later go in and write notes on them- will say "notes from Dr. Yong Channel"   Recommended follow up: Return in about 4 months (around 10/14/2020) for physical or sooner if needed.

## 2020-06-16 ENCOUNTER — Telehealth: Payer: Self-pay

## 2020-06-16 ENCOUNTER — Other Ambulatory Visit: Payer: Self-pay

## 2020-06-16 ENCOUNTER — Ambulatory Visit (INDEPENDENT_AMBULATORY_CARE_PROVIDER_SITE_OTHER): Payer: Medicare Other | Admitting: Family Medicine

## 2020-06-16 ENCOUNTER — Encounter: Payer: Self-pay | Admitting: Family Medicine

## 2020-06-16 VITALS — BP 130/80 | HR 79 | Temp 97.7°F | Ht 71.0 in | Wt 259.8 lb

## 2020-06-16 DIAGNOSIS — I10 Essential (primary) hypertension: Secondary | ICD-10-CM | POA: Diagnosis not present

## 2020-06-16 DIAGNOSIS — E119 Type 2 diabetes mellitus without complications: Secondary | ICD-10-CM

## 2020-06-16 DIAGNOSIS — E785 Hyperlipidemia, unspecified: Secondary | ICD-10-CM | POA: Diagnosis not present

## 2020-06-16 DIAGNOSIS — K219 Gastro-esophageal reflux disease without esophagitis: Secondary | ICD-10-CM | POA: Diagnosis not present

## 2020-06-16 LAB — CBC WITH DIFFERENTIAL/PLATELET
Basophils Absolute: 0.1 10*3/uL (ref 0.0–0.1)
Basophils Relative: 2 % (ref 0.0–3.0)
Eosinophils Absolute: 0.2 10*3/uL (ref 0.0–0.7)
Eosinophils Relative: 5.5 % — ABNORMAL HIGH (ref 0.0–5.0)
HCT: 34.8 % — ABNORMAL LOW (ref 39.0–52.0)
Hemoglobin: 11.3 g/dL — ABNORMAL LOW (ref 13.0–17.0)
Lymphocytes Relative: 48.8 % — ABNORMAL HIGH (ref 12.0–46.0)
Lymphs Abs: 2.2 10*3/uL (ref 0.7–4.0)
MCHC: 32.4 g/dL (ref 30.0–36.0)
MCV: 75.4 fl — ABNORMAL LOW (ref 78.0–100.0)
Monocytes Absolute: 0.6 10*3/uL (ref 0.1–1.0)
Monocytes Relative: 13.4 % — ABNORMAL HIGH (ref 3.0–12.0)
Neutro Abs: 1.4 10*3/uL (ref 1.4–7.7)
Neutrophils Relative %: 30.3 % — ABNORMAL LOW (ref 43.0–77.0)
Platelets: 156 10*3/uL (ref 150.0–400.0)
RBC: 4.61 Mil/uL (ref 4.22–5.81)
RDW: 17.4 % — ABNORMAL HIGH (ref 11.5–15.5)
WBC: 4.5 10*3/uL (ref 4.0–10.5)

## 2020-06-16 LAB — COMPREHENSIVE METABOLIC PANEL
ALT: 29 U/L (ref 0–53)
AST: 27 U/L (ref 0–37)
Albumin: 4.2 g/dL (ref 3.5–5.2)
Alkaline Phosphatase: 58 U/L (ref 39–117)
BUN: 16 mg/dL (ref 6–23)
CO2: 25 mEq/L (ref 19–32)
Calcium: 9.5 mg/dL (ref 8.4–10.5)
Chloride: 101 mEq/L (ref 96–112)
Creatinine, Ser: 1.15 mg/dL (ref 0.40–1.50)
GFR: 65.58 mL/min (ref >60.00)
Glucose, Bld: 95 mg/dL (ref 70–99)
Potassium: 4 mEq/L (ref 3.5–5.1)
Sodium: 134 mEq/L — ABNORMAL LOW (ref 135–145)
Total Bilirubin: 0.5 mg/dL (ref 0.2–1.2)
Total Protein: 7.4 g/dL (ref 6.0–8.3)

## 2020-06-16 LAB — HEMOGLOBIN A1C: Hgb A1c MFr Bld: 6.6 % — ABNORMAL HIGH (ref 4.6–6.5)

## 2020-06-16 LAB — LDL CHOLESTEROL, DIRECT: Direct LDL: 69 mg/dL

## 2020-06-16 MED ORDER — CLOTRIMAZOLE 1 % EX OINT: TOPICAL_OINTMENT | CUTANEOUS | 0 refills | Status: DC

## 2020-06-16 MED ORDER — CLOTRIMAZOLE 1 % EX CREA: 1.0000 "application " | TOPICAL_CREAM | Freq: Two times a day (BID) | CUTANEOUS | 0 refills | Status: DC

## 2020-06-16 NOTE — Telephone Encounter (Signed)
Yes thanks 

## 2020-06-16 NOTE — Telephone Encounter (Signed)
Ok to send

## 2020-06-16 NOTE — Telephone Encounter (Signed)
Cream has been sent in.

## 2020-06-16 NOTE — Telephone Encounter (Signed)
Pharmacy states the   Clotrimazole 1 % OINT  Is only available as a cream can we send a new RX as a cream

## 2020-06-17 ENCOUNTER — Other Ambulatory Visit: Payer: Self-pay | Admitting: *Deleted

## 2020-06-17 DIAGNOSIS — D649 Anemia, unspecified: Secondary | ICD-10-CM

## 2020-06-21 DIAGNOSIS — M7061 Trochanteric bursitis, right hip: Secondary | ICD-10-CM | POA: Diagnosis not present

## 2020-06-22 DIAGNOSIS — M7061 Trochanteric bursitis, right hip: Secondary | ICD-10-CM | POA: Diagnosis not present

## 2020-07-16 ENCOUNTER — Other Ambulatory Visit: Payer: Self-pay

## 2020-07-16 ENCOUNTER — Other Ambulatory Visit (INDEPENDENT_AMBULATORY_CARE_PROVIDER_SITE_OTHER): Payer: Medicare Other

## 2020-07-16 DIAGNOSIS — D649 Anemia, unspecified: Secondary | ICD-10-CM

## 2020-07-16 LAB — CBC WITH DIFFERENTIAL/PLATELET
Basophils Absolute: 0.1 10*3/uL (ref 0.0–0.1)
Basophils Relative: 2 % (ref 0.0–3.0)
Eosinophils Absolute: 0.3 10*3/uL (ref 0.0–0.7)
Eosinophils Relative: 6.1 % — ABNORMAL HIGH (ref 0.0–5.0)
HCT: 35.5 % — ABNORMAL LOW (ref 39.0–52.0)
Hemoglobin: 11.7 g/dL — ABNORMAL LOW (ref 13.0–17.0)
Lymphocytes Relative: 49.4 % — ABNORMAL HIGH (ref 12.0–46.0)
Lymphs Abs: 2.5 10*3/uL (ref 0.7–4.0)
MCHC: 32.8 g/dL (ref 30.0–36.0)
MCV: 74.5 fl — ABNORMAL LOW (ref 78.0–100.0)
Monocytes Absolute: 0.6 10*3/uL (ref 0.1–1.0)
Monocytes Relative: 12.9 % — ABNORMAL HIGH (ref 3.0–12.0)
Neutro Abs: 1.5 10*3/uL (ref 1.4–7.7)
Neutrophils Relative %: 29.6 % — ABNORMAL LOW (ref 43.0–77.0)
Platelets: 173 10*3/uL (ref 150.0–400.0)
RBC: 4.77 Mil/uL (ref 4.22–5.81)
RDW: 17.5 % — ABNORMAL HIGH (ref 11.5–15.5)
WBC: 5 10*3/uL (ref 4.0–10.5)

## 2020-07-22 ENCOUNTER — Other Ambulatory Visit (INDEPENDENT_AMBULATORY_CARE_PROVIDER_SITE_OTHER): Payer: Medicare Other

## 2020-07-22 DIAGNOSIS — D649 Anemia, unspecified: Secondary | ICD-10-CM

## 2020-07-22 LAB — FECAL OCCULT BLOOD, IMMUNOCHEMICAL: Fecal Occult Bld: NEGATIVE

## 2020-07-26 ENCOUNTER — Other Ambulatory Visit: Payer: Self-pay | Admitting: Family Medicine

## 2020-09-14 ENCOUNTER — Encounter: Payer: Self-pay | Admitting: Family Medicine

## 2020-09-14 ENCOUNTER — Telehealth (INDEPENDENT_AMBULATORY_CARE_PROVIDER_SITE_OTHER): Payer: Medicare Other | Admitting: Family Medicine

## 2020-09-14 ENCOUNTER — Other Ambulatory Visit (HOSPITAL_COMMUNITY): Payer: Self-pay

## 2020-09-14 VITALS — BP 161/98 | HR 105 | Temp 99.5°F | Ht 71.0 in | Wt 255.0 lb

## 2020-09-14 DIAGNOSIS — U071 COVID-19: Secondary | ICD-10-CM | POA: Diagnosis not present

## 2020-09-14 MED ORDER — MOLNUPIRAVIR EUA 200MG CAPSULE
4.0000 | ORAL_CAPSULE | Freq: Two times a day (BID) | ORAL | 0 refills | Status: AC
  Filled 2020-09-14: qty 40, 5d supply, fill #0

## 2020-09-14 MED ORDER — MOLNUPIRAVIR EUA 200MG CAPSULE: 4.0000 | ORAL_CAPSULE | Freq: Two times a day (BID) | ORAL | 0 refills | Status: DC

## 2020-09-14 NOTE — Progress Notes (Signed)
[facility] [address] [place] [address] Dept: [phone] Dept Fax: [phone]  Virtual Video Visit  I connected with Dillon [name] on 09/14/20 at  4:00 PM EDT by a video enabled telemedicine application and verified that I am speaking with the correct person using two identifiers.  Location patient: Home Location provider: Clinic Persons participating in the virtual visit: Patient, Provider  I discussed the limitations of evaluation and management by telemedicine and the availability of in person appointments. The patient expressed understanding and agreed to proceed.  Chief Complaint  Patient presents with  . Acute Visit    C/o having slight fever (99 -99.5), nasal congestion, fatigue, cough x 3 days.  Positive for  covid as of yesterday.  Was exposed by someone on Friday and Sunday.     SUBJECTIVE:  HPI: Dillon [name] is a 70 y.o. male who presents with a 3-day history of nasal congestion, cough, and fatigue. He took an at-home COVID test which was positive. He has felt slightly feverish, though his temps are normal. He denies any dyspnea. He has been taking Robitussin DM  and Tylenol for symptomatic relief. Dillon [name] has a history of OSA, hypertension, hyperlipidemia, and diet-controlled Type 2 DM. He does feel he is eating and drinking okay. He has had vaccines for COVID (2+1 booster).  Patient Active Problem List   Diagnosis Date Noted  . Genetic testing 02/02/2020  . Family history of prostate cancer   . Family history of bladder cancer   . Family history of genetic disease 11/26/2019  . Hyperlipidemia, unspecified 05/06/2018  . Insomnia 05/06/2018  . GERD (gastroesophageal reflux disease) 05/06/2018  . Former smoker 05/06/2018  . Diabetes mellitus without complication ([place]) 10/22/7626  . Morbid obesity ([place]) 05/09/2016  . OSA (obstructive sleep apnea) 11/30/2015  . Elevated PSA 08/25/2015  . Mild stage  glaucoma(365.71) 02/12/2011  . Venous tributary (branch) occlusion of retina 02/12/2011  . ERECTILE DYSFUNCTION 05/07/2008  . Essential hypertension 05/16/2007   Past Surgical History:  Procedure Laterality Date  . WRIST GANGLION EXCISION     he reports several cysts in past   Family History  Problem Relation Age of Onset  . Coronary artery disease Brother        stent. patient 26 years older than patient.   . Hyperlipidemia Brother   . Stroke Mother        complications after a fall  . Heart attack Father        age 70. smoker  . Prostate cancer Father   . Hypertension Brother   . Cancer Brother        small cell lung cancer died at 52  . Other Brother        airplane crash  . Bladder Cancer Maternal Uncle        dx mid 41s  . Prostate cancer Maternal Uncle   . Heart defect Son        transposition of arteries   Social History   Tobacco Use  . Smoking status: Former Smoker    Packs/day: 0.20    Years: 2.00    Pack years: 0.40    Quit date: 05/02/1991    Years since quitting: 29.3  . Smokeless tobacco: Never Used  Substance Use Topics  . Alcohol use: Yes    Alcohol/week: 2.0 standard drinks    Types: 2 Standard drinks or equivalent per week  . Drug use: No  Current Outpatient Medications:  .  aspirin 81 MG tablet, Take 81 mg by mouth daily., Disp: , Rfl:  .  clotrimazole (LOTRIMIN) 1 % cream, Apply 1 application topically 2 (two) times daily., Disp: 30 g, Rfl: 0 .  latanoprost (XALATAN) 0.005 % ophthalmic solution, Place 1 drop into both eyes at bedtime., Disp: , Rfl:  .  losartan (COZAAR) 100 MG tablet, TAKE ONE TABLET BY MOUTH DAILY, Disp: 90 tablet, Rfl: 3 .  meloxicam (MOBIC) 15 MG tablet, Take 1 tablet by mouth daily., Disp: , Rfl:  .  omeprazole (PRILOSEC) 20 MG capsule, TAKE ONE CAPSULE BY MOUTH DAILY, Disp: 90 capsule, Rfl: 3 .  rosuvastatin (CRESTOR) 5 MG tablet, Take 1 tablet (5 mg total) by mouth once a week., Disp: 13 tablet, Rfl: 3 .  Sildenafil  Citrate (VIAGRA PO), Take by mouth., Disp: , Rfl:  .  traZODone (DESYREL) 50 MG tablet, Take 0.5-1 tablets (25-50 mg total) by mouth at bedtime as needed for sleep., Disp: 30 tablet, Rfl: 3 .  molnupiravir EUA 200 mg CAPS, Take 4 capsules (800 mg total) by mouth 2 (two) times daily for 5 days., Disp: 40 capsule, Rfl: 0  No Known Allergies  ROS: See pertinent positives and negatives per HPI.  OBSERVATIONS/OBJECTIVE:  VITALS per patient if applicable: Today's Vitals   09/14/20 1554  BP: (!) 161/98  Pulse: (!) 105  Temp: 99.5 F (37.5 C)  SpO2: 97%  Weight: 255 lb (115.7 kg)  Height: 5\' 11"  (1.803 m)   Body mass index is 35.57 kg/m.   GENERAL: Alert and oriented. Appears well and in no acute distress.  HEENT: Atraumatic. Conjunctiva clear. No obvious abnormalities on inspection of external nose and ears.  NECK: Normal movements of the head and neck.  LUNGS: On inspection, no signs of respiratory distress. Breathing rate appears normal. No obvious gross SOB, gasping or wheezing, and no conversational dyspnea.  CV: No obvious cyanosis.  MS: Moves all visible extremities without noticeable abnormality.  PSYCH/NEURO: Pleasant and cooperative. No obvious depression or anxiety. Speech and thought processing grossly intact.  ASSESSMENT AND PLAN:  1. COVID-19 Reviewed home care instructions for COVID. Advised self-isolation at home for at least 5 days. After 5 days, if improved and fever resolved, can be in public, but should wear a mask around others for an additional 5 days. May continue OTC medications for symptomatic relief. If symptoms, esp, dyspnea develops/worsens, recommend in-person evaluation at either an urgent care, emergency room, or [facility] [phone]). Based on age and diabetes, I recommend treatment with an antiviral.  - molnupiravir EUA 200 mg CAPS; Take 4 capsules (800 mg total) by mouth 2 (two) times daily for 5 days.  Dispense: 40 capsule;  Refill: 0  I discussed the assessment and treatment plan with the patient. The patient was provided an opportunity to ask questions and all were answered. The patient agreed with the plan and demonstrated an understanding of the instructions.   The patient was advised to call back or seek an in-person evaluation if the symptoms worsen or if the condition fails to improve as anticipated.   Haydee Salter, MD

## 2020-09-28 NOTE — Progress Notes (Signed)
  Chronic Care Management   Note  09/28/2020 Name: Dillon [name] MRN: [id number] DOB: 19-Nov-1950  Dillon [name] is a 70 y.o. year old male who is a primary care patient of Marin Olp, MD. I reached out to [name] by phone today in response to a referral sent by Dillon [name] PCP, Marin Olp, MD.   Mr. Franchini was given information about Chronic Care Management services today including:  1. CCM service includes personalized support from designated clinical staff supervised by his physician, including individualized plan of care and coordination with other care providers 2. 24/7 contact phone numbers for assistance for urgent and routine care needs. 3. Service will only be billed when office clinical staff spend 20 minutes or more in a month to coordinate care. 4. Only one practitioner may furnish and bill the service in a calendar month. 5. The patient may stop CCM services at any time (effective at the end of the month) by phone call to the office staff.   Patient agreed to services and verbal consent obtained.   Follow up plan:   Lauretta Grill Upstream Scheduler

## 2020-10-18 ENCOUNTER — Other Ambulatory Visit: Payer: Self-pay

## 2020-10-18 ENCOUNTER — Ambulatory Visit (INDEPENDENT_AMBULATORY_CARE_PROVIDER_SITE_OTHER): Payer: Medicare Other | Admitting: Family Medicine

## 2020-10-18 ENCOUNTER — Encounter: Payer: Self-pay | Admitting: Family Medicine

## 2020-10-18 VITALS — BP 122/84 | HR 77 | Temp 97.7°F | Ht 71.0 in | Wt 258.6 lb

## 2020-10-18 DIAGNOSIS — R972 Elevated prostate specific antigen [PSA]: Secondary | ICD-10-CM

## 2020-10-18 DIAGNOSIS — E785 Hyperlipidemia, unspecified: Secondary | ICD-10-CM | POA: Diagnosis not present

## 2020-10-18 DIAGNOSIS — I10 Essential (primary) hypertension: Secondary | ICD-10-CM | POA: Diagnosis not present

## 2020-10-18 DIAGNOSIS — E119 Type 2 diabetes mellitus without complications: Secondary | ICD-10-CM | POA: Diagnosis not present

## 2020-10-18 DIAGNOSIS — D649 Anemia, unspecified: Secondary | ICD-10-CM

## 2020-10-18 LAB — LIPID PANEL
Cholesterol: 152 mg/dL (ref 0–200)
HDL: 29.3 mg/dL — ABNORMAL LOW (ref >39.00)
LDL Cholesterol: 85 mg/dL (ref 0–99)
NonHDL: 122.21
Total CHOL/HDL Ratio: 5
Triglycerides: 187 mg/dL — ABNORMAL HIGH (ref 0.0–149.0)
VLDL: 37.4 mg/dL (ref 0.0–40.0)

## 2020-10-18 LAB — CBC WITH DIFFERENTIAL/PLATELET
Basophils Absolute: 0.1 10*3/uL (ref 0.0–0.1)
Basophils Relative: 1.8 % (ref 0.0–3.0)
Eosinophils Absolute: 0.3 10*3/uL (ref 0.0–0.7)
Eosinophils Relative: 5.9 % — ABNORMAL HIGH (ref 0.0–5.0)
HCT: 38.7 % — ABNORMAL LOW (ref 39.0–52.0)
Hemoglobin: 12.7 g/dL — ABNORMAL LOW (ref 13.0–17.0)
Lymphocytes Relative: 43.1 % (ref 12.0–46.0)
Lymphs Abs: 2.2 10*3/uL (ref 0.7–4.0)
MCHC: 32.8 g/dL (ref 30.0–36.0)
MCV: 77.5 fl — ABNORMAL LOW (ref 78.0–100.0)
Monocytes Absolute: 0.5 10*3/uL (ref 0.1–1.0)
Monocytes Relative: 10.9 % (ref 3.0–12.0)
Neutro Abs: 1.9 10*3/uL (ref 1.4–7.7)
Neutrophils Relative %: 38.3 % — ABNORMAL LOW (ref 43.0–77.0)
Platelets: 139 10*3/uL — ABNORMAL LOW (ref 150.0–400.0)
RBC: 5 Mil/uL (ref 4.22–5.81)
RDW: 19.9 % — ABNORMAL HIGH (ref 11.5–15.5)
WBC: 5 10*3/uL (ref 4.0–10.5)

## 2020-10-18 LAB — COMPREHENSIVE METABOLIC PANEL
ALT: 30 U/L (ref 0–53)
AST: 27 U/L (ref 0–37)
Albumin: 4.5 g/dL (ref 3.5–5.2)
Alkaline Phosphatase: 59 U/L (ref 39–117)
BUN: 17 mg/dL (ref 6–23)
CO2: 25 mEq/L (ref 19–32)
Calcium: 9.9 mg/dL (ref 8.4–10.5)
Chloride: 102 mEq/L (ref 96–112)
Creatinine, Ser: 1.25 mg/dL (ref 0.40–1.50)
GFR: 59.2 mL/min — ABNORMAL LOW (ref >60.00)
Glucose, Bld: 121 mg/dL — ABNORMAL HIGH (ref 70–99)
Potassium: 4.5 mEq/L (ref 3.5–5.1)
Sodium: 135 mEq/L (ref 135–145)
Total Bilirubin: 0.9 mg/dL (ref 0.2–1.2)
Total Protein: 7.1 g/dL (ref 6.0–8.3)

## 2020-10-18 LAB — PSA: PSA: 1.83 ng/mL (ref 0.10–4.00)

## 2020-10-18 LAB — HEMOGLOBIN A1C: Hgb A1c MFr Bld: 6.9 % — ABNORMAL HIGH (ref 4.6–6.5)

## 2020-10-18 NOTE — Patient Instructions (Addendum)
Health Maintenance Due  Topic Date Due   OPHTHALMOLOGY EXAM Will get scheduled. Have them send [place] a copy Never done   Zoster Vaccines- Shingrix (1 of 2) Please check with your pharmacy to see if they have the shingrix vaccine. If they do- please get this immunization and update [place] by phone call or mychart with dates you receive the vaccine  Never done   COVID-19 Vaccine (4 - Booster for [employer] series) Will get this scheduled likely in fall- push out 6 months from covid infection at least then look at current guidelines 07/08/2020   Please stop by lab before you go If you have mychart- we will send your results within 3 business days of [place] receiving them.  If you do not have mychart- we will call you about results within 5 business days of [place] receiving them.  *please also note that you will see labs on mychart as soon as they post. I will later go in and write notes on them- will say "notes from Dr. Yong Channel"  Recommended follow up: Return in about 4 months (around 02/17/2021) for follow up- or sooner if needed.

## 2020-10-18 NOTE — Progress Notes (Signed)
Labs ordered.

## 2020-10-18 NOTE — Progress Notes (Signed)
Phone [phone] In person visit   Subjective:   Dillon [name] is a 70 y.o. year old very pleasant male patient who presents for/with See problem oriented charting Chief Complaint  Patient presents with   Hypertension   Hyperlipidemia   This visit occurred during the SARS-CoV-2 public health emergency.  Safety protocols were in place, including screening questions prior to the visit, additional usage of staff PPE, and extensive cleaning of exam room while observing appropriate contact time as indicated for disinfecting solutions.   Past Medical History-  Patient Active Problem List   Diagnosis Date Noted   Diabetes mellitus without complication ([place]) 36/64/4034    Priority: High   Hyperlipidemia, unspecified 05/06/2018    Priority: Medium   GERD (gastroesophageal reflux disease) 05/06/2018    Priority: Medium   Former smoker 05/06/2018    Priority: Medium   OSA (obstructive sleep apnea) 11/30/2015    Priority: Medium   Elevated PSA 08/25/2015    Priority: Medium   Mild stage glaucoma(365.71) 02/12/2011    Priority: Medium   Venous tributary (branch) occlusion of retina 02/12/2011    Priority: Medium   Essential hypertension 05/16/2007    Priority: Medium   Insomnia 05/06/2018    Priority: Low   ERECTILE DYSFUNCTION 05/07/2008    Priority: Low   Genetic testing 02/02/2020   Family history of prostate cancer    Family history of bladder cancer    Family history of genetic disease 11/26/2019   Morbid obesity ([place]) 05/09/2016    Medications- reviewed and updated Current Outpatient Medications  Medication Sig Dispense Refill   aspirin 81 MG tablet Take 81 mg by mouth daily.     clotrimazole (LOTRIMIN) 1 % cream Apply 1 application topically 2 (two) times daily. 30 g 0   latanoprost (XALATAN) 0.005 % ophthalmic solution Place 1 drop into both eyes at bedtime.     losartan (COZAAR) 100 MG tablet TAKE ONE TABLET BY MOUTH DAILY 90 tablet 3   meloxicam (MOBIC) 15 MG  tablet Take 1 tablet by mouth daily.     omeprazole (PRILOSEC) 20 MG capsule TAKE ONE CAPSULE BY MOUTH DAILY 90 capsule 3   rosuvastatin (CRESTOR) 5 MG tablet Take 1 tablet (5 mg total) by mouth once a week. 13 tablet 3   Sildenafil Citrate (VIAGRA PO) Take by mouth.     traZODone (DESYREL) 50 MG tablet Take 0.5-1 tablets (25-50 mg total) by mouth at bedtime as needed for sleep. 30 tablet 3   No current facility-administered medications for this visit.     Objective:  BP 122/84   Pulse 77   Temp 97.7 F (36.5 C) (Temporal)   Ht 5\' 11"  (1.803 m)   Wt 258 lb 9.6 oz (117.3 kg)   SpO2 97%   BMI 36.07 kg/m  Gen: NAD, resting comfortably CV: RRR no murmurs rubs or gallops Lungs: CTAB no crackles, wheeze, rhonchi Ext: no edema Skin: warm, dry     Assessment and Plan   #Social update-lost his brother to stage IV small cell lung cancer prior to last visit-have been providing a lot of caregiving.  # Covid 19 -had this back in may- took wife for colonoscopy and she ended up having covid. He took molnupiravir. Mild lingering cough but otherwise well. Will hold off on booster  # Diabetes S: Medication: Has been diet controlled CBGs- does not have meter Exercise and diet- not exercising as much as he should- busy with stuff with house and work- set  back with covid. He wants to get back to walking twice a day.  Lab Results  Component Value Date   HGBA1C 6.6 (H) 06/16/2020   HGBA1C 6.4 (H) 11/26/2019   HGBA1C 6.6 (H) 05/16/2019  A/P: hopefully stable- update a1c today. Continue current meds    #hypertension S: medication: Losartan 100 mg (not yet this AM- usually around 6)  Some cough on this but better on PPI Home readings #s: 120s/low 80s BP Readings from Last 3 Encounters:  10/18/20 122/84  09/14/20 (!) 161/98  06/16/20 130/80  A/P: Stable. Continue current medications.    #hyperlipidemia S: Medication: Rosuvastatin 5 mg once a week Lab Results  Component Value Date    CHOL 170 05/16/2019   HDL 33.30 (L) 05/16/2019   LDLCALC 90 11/08/2018   LDLDIRECT 69.0 06/16/2020   TRIG 206.0 (H) 05/16/2019   CHOLHDL 5 05/16/2019   A/P: Excellent control direct LDL on last visit-time for full lipid panel-we will update today   #GERD-Prilosec 20 mg continues to help with cough-I discussed potentially trying Pepcid after weight loss but weight stable at this point- his long term goal is to lose 30-40 lbs   #Penile rash-appears to be balanitis-has seen Dr. [place] Crane of dermatology in the past as well as [facility] urology (now has new urologist for next visit and visit got pushed back) who has followed his elevated PSA- has upcoming visit in July .  Last visit we discussed more advanced hygiene as well as clotrimazole twice a week-patient reports much better but still going to discuss with urology.   Recommended follow up: Return in about 4 months (around 02/17/2021) for follow up- or sooner if needed. Future Appointments  Date Time Provider [place]  11/09/2020  1:30 PM [facility] CCM PHARMACIST [facility]  11/18/2020  8:00 AM [facility] HEALTH COACH [facility]  05/23/2021  7:30 AM Lavonna Monarch, MD [facility]    Lab/Order associations: coffee with milk   ICD-10-CM   1. Diabetes mellitus without complication (HCC)  U38.4 CBC with Differential/Platelet    Comprehensive metabolic panel    Lipid panel    Hemoglobin A1c    2. Elevated PSA  R97.20 PSA    3. Hyperlipidemia, unspecified hyperlipidemia type  E78.5 CBC with Differential/Platelet    Comprehensive metabolic panel    Lipid panel    4. Essential hypertension  I10 CBC with Differential/Platelet    Comprehensive metabolic panel    Lipid panel      No orders of the defined types were placed in this encounter.  Return precautions advised.  Garret Reddish, MD

## 2020-10-26 ENCOUNTER — Other Ambulatory Visit: Payer: Self-pay

## 2020-10-26 ENCOUNTER — Other Ambulatory Visit (INDEPENDENT_AMBULATORY_CARE_PROVIDER_SITE_OTHER): Payer: Medicare Other

## 2020-10-26 DIAGNOSIS — D649 Anemia, unspecified: Secondary | ICD-10-CM | POA: Diagnosis not present

## 2020-10-26 DIAGNOSIS — D509 Iron deficiency anemia, unspecified: Secondary | ICD-10-CM

## 2020-10-26 LAB — CBC WITH DIFFERENTIAL/PLATELET
Basophils Absolute: 0.1 10*3/uL (ref 0.0–0.1)
Basophils Relative: 1.5 % (ref 0.0–3.0)
Eosinophils Absolute: 0.3 10*3/uL (ref 0.0–0.7)
Eosinophils Relative: 4.9 % (ref 0.0–5.0)
HCT: 38.2 % — ABNORMAL LOW (ref 39.0–52.0)
Hemoglobin: 12.5 g/dL — ABNORMAL LOW (ref 13.0–17.0)
Lymphocytes Relative: 39.7 % (ref 12.0–46.0)
Lymphs Abs: 2.2 10*3/uL (ref 0.7–4.0)
MCHC: 32.8 g/dL (ref 30.0–36.0)
MCV: 77.3 fl — ABNORMAL LOW (ref 78.0–100.0)
Monocytes Absolute: 0.7 10*3/uL (ref 0.1–1.0)
Monocytes Relative: 12 % (ref 3.0–12.0)
Neutro Abs: 2.3 10*3/uL (ref 1.4–7.7)
Neutrophils Relative %: 41.9 % — ABNORMAL LOW (ref 43.0–77.0)
Platelets: 158 10*3/uL (ref 150.0–400.0)
RBC: 4.95 Mil/uL (ref 4.22–5.81)
RDW: 19.7 % — ABNORMAL HIGH (ref 11.5–15.5)
WBC: 5.4 10*3/uL (ref 4.0–10.5)

## 2020-10-26 LAB — FERRITIN: Ferritin: 7 ng/mL — ABNORMAL LOW (ref 22.0–322.0)

## 2020-10-27 ENCOUNTER — Encounter: Payer: Self-pay | Admitting: Family Medicine

## 2020-10-27 LAB — PATHOLOGIST SMEAR REVIEW

## 2020-11-03 ENCOUNTER — Telehealth: Payer: Self-pay

## 2020-11-03 NOTE — Telephone Encounter (Signed)
Patient is aware of his lab results, Dillon [name] called and spoke with him. It is documented in his Lab results in his chart.

## 2020-11-03 NOTE — Telephone Encounter (Signed)
Patient is returning a call from [place].

## 2020-11-08 ENCOUNTER — Telehealth: Payer: Self-pay

## 2020-11-08 NOTE — Chronic Care Management (AMB) (Signed)
    Chronic Care Management Pharmacy Assistant   Name: Dillon [name]  MRN: [id number] DOB: 19-Oct-1950  Reason for Encounter: CPP Visit Reschedule  Recent office visits:  10/26/20- Orders only- Referral to gastroenterology 10/18/20- Garret Reddish, MD- chronic conditions addressed, no medication changes, follow up 4 months 09/14/20- Arlester Marker, MD (Video Visit)- seen for Covid- 19, short course molnupiravir 800 mg bid x 5 days, no follow up documented  06/16/20- Garret Reddish, MD (PCP)- chronic conditions addressed, short course clotrimazole 1% bid for 1 week, follow up 4 months   Recent consult visits:  05/19/20- Lavonna Monarch, MD (Dermatology)- new patient, history of basal cell cancer, no medication changes, follow up 1 yr   Hospital visits:  None in previous 6 months  Medications: Outpatient Encounter Medications as of 11/08/2020  Medication Sig Note   aspirin 81 MG tablet Take 81 mg by mouth daily.    clotrimazole (LOTRIMIN) 1 % cream Apply 1 application topically 2 (two) times daily.    latanoprost (XALATAN) 0.005 % ophthalmic solution Place 1 drop into both eyes at bedtime.    losartan (COZAAR) 100 MG tablet TAKE ONE TABLET BY MOUTH DAILY    meloxicam (MOBIC) 15 MG tablet Take 1 tablet by mouth daily.    omeprazole (PRILOSEC) 20 MG capsule TAKE ONE CAPSULE BY MOUTH DAILY    rosuvastatin (CRESTOR) 5 MG tablet Take 1 tablet (5 mg total) by mouth once a week.    Sildenafil Citrate (VIAGRA PO) Take by mouth. 08/10/2016: Prn    traZODone (DESYREL) 50 MG tablet Take 0.5-1 tablets (25-50 mg total) by mouth at bedtime as needed for sleep.    No facility-administered encounter medications on file as of 11/08/2020.   Original appointment: 07/12 at 1:30 pm Rescheduled phone visit for 07/25 at 8:30 am  [employer] [employer] CPA, CMA

## 2020-11-09 ENCOUNTER — Telehealth: Payer: Medicare Other

## 2020-11-15 DIAGNOSIS — R972 Elevated prostate specific antigen [PSA]: Secondary | ICD-10-CM | POA: Diagnosis not present

## 2020-11-15 DIAGNOSIS — N528 Other male erectile dysfunction: Secondary | ICD-10-CM | POA: Diagnosis not present

## 2020-11-18 ENCOUNTER — Ambulatory Visit (INDEPENDENT_AMBULATORY_CARE_PROVIDER_SITE_OTHER): Payer: Medicare Other

## 2020-11-18 DIAGNOSIS — Z0000 Encounter for general adult medical examination without abnormal findings: Secondary | ICD-10-CM

## 2020-11-18 NOTE — Progress Notes (Addendum)
Virtual Visit via Telephone Note  I connected with  Dillon [name] on 11/18/20 at  8:00 AM EDT by telephone and verified that I am speaking with the correct person using two identifiers.  Medicare Annual Wellness visit completed telephonically due to Covid-19 pandemic.   Persons participating in this call: This Health Coach and this patient.   Location: Patient: Home Provider: Office   I discussed the limitations, risks, security and privacy concerns of performing an evaluation and management service by telephone and the availability of in person appointments. The patient expressed understanding and agreed to proceed.  Unable to perform video visit due to video visit attempted and failed and/or patient does not have video capability.   Some vital signs may be absent or patient reported.   [name], LPN   Subjective:   Dillon [name] is a 70 y.o. male who presents for Medicare Annual/Subsequent preventive examination.  Review of Systems     Cardiac Risk Factors include: advanced age (>12men, >60 women);hypertension;diabetes mellitus;dyslipidemia;male gender;obesity (BMI >30kg/m2)     Objective:    There were no vitals filed for this visit. There is no height or weight on file to calculate BMI.  Advanced Directives 11/18/2020 11/13/2019 04/28/2018 12/19/2015  Does Patient Have a Medical Advance Directive? Yes Yes No Yes  Type of Advance Directive [place];Living will  Does patient want to make changes to medical advance directive? - Yes (MAU/Ambulatory/Procedural Areas - Information given) - No - Patient declined  Copy of [place] in Chart? No - copy requested - - No - copy requested    Current Medications (verified) Outpatient Encounter Medications as of 11/18/2020  Medication Sig   aspirin 81 MG tablet Take 81 mg by mouth daily.   clotrimazole (LOTRIMIN) 1 % cream Apply 1 application topically 2  (two) times daily.   latanoprost (XALATAN) 0.005 % ophthalmic solution Place 1 drop into both eyes at bedtime.   losartan (COZAAR) 100 MG tablet TAKE ONE TABLET BY MOUTH DAILY   meloxicam (MOBIC) 15 MG tablet Take 1 tablet by mouth daily. As needed   omeprazole (PRILOSEC) 20 MG capsule TAKE ONE CAPSULE BY MOUTH DAILY   rosuvastatin (CRESTOR) 5 MG tablet Take 1 tablet (5 mg total) by mouth once a week.   Sildenafil Citrate (VIAGRA PO) Take by mouth. As needed   traZODone (DESYREL) 50 MG tablet Take 0.5-1 tablets (25-50 mg total) by mouth at bedtime as needed for sleep.   No facility-administered encounter medications on file as of 11/18/2020.    Allergies (verified) Patient has no known allergies.   History: Past Medical History:  Diagnosis Date   BCC (basal cell carcinoma of skin)    2008 or 2009 left shoulder   ED (erectile dysfunction)    Elevated liver function tests    2020 labs normal- issues in past    Family history of bladder cancer    Family history of prostate cancer    Hypertension    Past Surgical History:  Procedure Laterality Date   WRIST GANGLION EXCISION     he reports several cysts in past   Family History  Problem Relation Age of Onset   Coronary artery disease Brother        stent. patient 18 years older than patient.    Hyperlipidemia Brother    Stroke Mother        complications after a fall   Heart attack  Father        age 72. smoker   Prostate cancer Father    Hypertension Brother    Cancer Brother        small cell lung cancer died at 43   Other Brother        airplane crash   Bladder Cancer Maternal Uncle        dx mid 63s   Prostate cancer Maternal Uncle    Heart defect Son        transposition of arteries   Social History   Socioeconomic History   Marital status: Divorced    Spouse name: Not on file   Number of children: Not on file   Years of education: Not on file   Highest education level: Not on file  Occupational History    Occupation: semi retired  Tobacco Use   Smoking status: Former    Packs/day: 0.20    Years: 2.00    Pack years: 0.40    Types: Cigarettes    Quit date: 05/02/1991    Years since quitting: 29.5   Smokeless tobacco: Never  Substance and Sexual Activity   Alcohol use: Yes    Alcohol/week: 2.0 standard drinks    Types: 2 Standard drinks or equivalent per week   Drug use: No   Sexual activity: Yes  Other Topics Concern   Not on file  Social History Narrative   Divorced. Dating some. 3 children from prior marriage. 4 grandsons. No pets.       Self employeed Materials engineer   Social Determinants of Health   Financial Resource Strain: Low Risk    Difficulty of Paying Living Expenses: Not hard at all  Food Insecurity: No Food Insecurity   Worried About Charity fundraiser in the Last Year: Never true   Arboriculturist in the Last Year: Never true  Transportation Needs: No Transportation Needs   Lack of Transportation (Medical): No   Lack of Transportation (Non-Medical): No  Physical Activity: Inactive   Days of Exercise per Week: 0 days   Minutes of Exercise per Session: 0 min  Stress: No Stress Concern Present   Feeling of Stress : Not at all  Social Connections: Moderately Integrated   Frequency of Communication with Friends and Family: More than three times a week   Frequency of Social Gatherings with Friends and Family: More than three times a week   Attends Religious Services: 1 to 4 times per year   Active Member of [employer] or Organizations: Yes   Attends Archivist Meetings: 1 to 4 times per year   Marital Status: Divorced    Tobacco Counseling Counseling given: Not Answered   Clinical Intake:  Pre-visit preparation completed: Yes  Pain : No/denies pain     BMI - recorded: 36.08 Nutritional Status: BMI > 30  Obese Nutritional Risks: None Diabetes: Yes CBG done?: No Did pt. bring in CBG monitor from home?: No  How often do you need to have  someone help you when you read instructions, pamphlets, or other written materials from your doctor or pharmacy?: 1 - Never  Diabetic?Nutrition Risk Assessment:  Has the patient had any N/V/D within the last 2 months?  No  Does the patient have any non-healing wounds?  No  Has the patient had any unintentional weight loss or weight gain?  No   Diabetes:  Is the patient diabetic?  Yes  If diabetic, was a CBG obtained today?  No  Did the patient bring in their glucometer from home?  No  How often do you monitor your CBG's? N/A.   Financial Strains and Diabetes Management:  Are you having any financial strains with the device, your supplies or your medication? No .  Does the patient want to be seen by Chronic Care Management for management of their diabetes?  No  Would the patient like to be referred to a Nutritionist or for Diabetic Management?  No   Diabetic Exams:  Diabetic Eye Exam: Overdue for diabetic eye exam. Pt has been advised about the importance in completing this exam. Patient advised to call and schedule an eye exam. Pt stated appt 11/19/20 and following week  Diabetic Foot Exam: Completed 11/26/19   Interpreter Needed?: No  Information entered by :: Charlott Rakes, LPN   Activities of Daily Living In your present state of health, do you have any difficulty performing the following activities: 11/18/2020  Hearing? N  Vision? N  Difficulty concentrating or making decisions? N  Walking or climbing stairs? N  Dressing or bathing? N  Doing errands, shopping? N  Preparing Food and eating ? N  Using the Toilet? N  In the past six months, have you accidently leaked urine? N  Do you have problems with loss of bowel control? N  Managing your Medications? N  Managing your Finances? N  Housekeeping or managing your Housekeeping? N  Some recent data might be hidden    Patient Care Team: Marin Olp, MD as PCP - General (Family Medicine) Madelin Rear, [facility] as  Pharmacist (Pharmacist) Lavonna Monarch, MD as Consulting Physician (Dermatology)  Indicate any recent Medical Services you may have received from other than [facility] providers in the past year (date may be approximate).     Assessment:   This is a routine wellness examination for Dillon [name].  Hearing/Vision screen Hearing Screening - Comments:: Pt denies any hearing issues  Vision Screening - Comments:: Pt follows [facility] annually for eye exams   Dietary issues and exercise activities discussed: Current Exercise Habits: The patient does not participate in regular exercise at present   Goals Addressed             This Visit's Progress    Patient Stated       Stay on top of iron level        Depression Screen PHQ 2/9 Scores 11/18/2020 10/18/2020 06/16/2020 11/13/2019 11/08/2018 09/04/2017 07/27/2015  PHQ - 2 Score 0 0 0 0 0 0 0    Fall Risk Fall Risk  11/18/2020 10/18/2020 06/16/2020 11/13/2019 11/08/2018  Falls in the past year? 0 0 0 0 1  Number falls in past yr: 0 0 0 0 0  Injury with Fall? 0 0 0 0 0  Risk for fall due to : Impaired vision - - Impaired vision -  Follow up Falls prevention discussed - - - -    FALL RISK PREVENTION PERTAINING TO THE HOME:  Any stairs in or around the home? Yes  If so, are there any without handrails? No  Home free of loose throw rugs in walkways, pet beds, electrical cords, etc? Yes  Adequate lighting in your home to reduce risk of falls? Yes   ASSISTIVE DEVICES UTILIZED TO PREVENT FALLS:  Life alert? No  Use of a cane, walker or w/c? No  Grab bars in the bathroom? No  Shower chair or bench in shower? No  Elevated toilet seat or a handicapped toilet? No  TIMED UP AND GO:  Was the test performed? No     Cognitive Function:     6CIT Screen 11/18/2020 11/13/2019  What Year? 0 points 0 points  What month? 0 points 0 points  What time? 0 points 0 points  Count back from 20 0 points 0 points  Months in reverse 0 points 0 points   Repeat phrase 0 points 0 points  Total Score 0 0    Immunizations Immunization History  Administered Date(s) Administered   Fluad Quad(high Dose 65+) 05/16/2019   PFIZER(Purple Top)SARS-COV-2 Vaccination 06/14/2019, 07/09/2019, 04/09/2020   Pneumococcal Conjugate-13 11/26/2019   Tdap 03/06/2013    TDAP status: Up to date  Flu Vaccine status: Up to date  Pneumococcal vaccine status: Up to date  Covid-19 vaccine status: Completed vaccines  Qualifies for Shingles Vaccine? Yes   Zostavax completed No   Shingrix Completed?: No.    Education has been provided regarding the importance of this vaccine. Patient has been advised to call insurance company to determine out of pocket expense if they have not yet received this vaccine. Advised may also receive vaccine at local pharmacy or Health Dept. Verbalized acceptance and understanding.  Screening Tests Health Maintenance  Topic Date Due   OPHTHALMOLOGY EXAM  Never done   Zoster Vaccines- Shingrix (1 of 2) Never done   COVID-19 Vaccine (4 - Booster for Pfizer series) 07/08/2020   FOOT EXAM  11/25/2020   PNA vac Low Risk Adult (2 of 2 - PPSV23) 11/25/2020   INFLUENZA VACCINE  11/29/2020   HEMOGLOBIN A1C  04/19/2021   COLONOSCOPY (Pts 45-60yrs Insurance coverage will need to be confirmed)  12/26/2022   TETANUS/TDAP  03/07/2023   Hepatitis C Screening  Completed   HPV VACCINES  Aged Out    Health Maintenance  Health Maintenance Due  Topic Date Due   OPHTHALMOLOGY EXAM  Never done   Zoster Vaccines- Shingrix (1 of 2) Never done   COVID-19 Vaccine (4 - Booster for Pfizer series) 07/08/2020    Colorectal cancer screening: Type of screening: Colonoscopy. Completed 12/25/12. Repeat every 10 years  Additional Screening:  Hepatitis C Screening: Completed 11/08/18  Vision Screening: Recommended annual ophthalmology exams for early detection of glaucoma and other disorders of the eye. Is the patient up to date with their annual  eye exam?  Yes  Who is the provider or what is the name of the office in which the patient attends annual eye exams? [facility]  If pt is not established with a provider, would they like to be referred to a provider to establish care? No .   Dental Screening: Recommended annual dental exams for proper oral hygiene  Community Resource Referral / Chronic Care Management: CRR required this visit?  No   CCM required this visit?  No      Plan:     I have personally reviewed and noted the following in the patient's chart:   Medical and social history Use of alcohol, tobacco or illicit drugs  Current medications and supplements including opioid prescriptions. Patient is not currently taking opioid prescriptions. Functional ability and status Nutritional status Physical activity Advanced directives List of other physicians Hospitalizations, surgeries, and ER visits in previous 12 months Vitals Screenings to include cognitive, depression, and falls Referrals and appointments  In addition, I have reviewed and discussed with patient certain preventive protocols, quality metrics, and best practice recommendations. A written personalized care plan for preventive services as well as general  preventive health recommendations were provided to patient.     [name], LPN   [id number]   Nurse Notes: none

## 2020-11-18 NOTE — Patient Instructions (Signed)
Dillon [name] , Thank you for taking time to come for your Medicare Wellness Visit. I appreciate your ongoing commitment to your health goals. Please review the following plan we discussed and let me know if I can assist you in the future.   Screening recommendations/referrals: Colonoscopy: Done 12/25/12 repeat in 10 years 12/26/22 Recommended yearly ophthalmology/optometry visit for glaucoma screening and checkup Recommended yearly dental visit for hygiene and checkup  Vaccinations: Influenza vaccine: Due 11/29/20 Pneumococcal vaccine: Completed  Tdap vaccine: Done 03/06/13 repeat in 10 years 03/07/23 Shingles vaccine: Shingrix discussed. Please contact your pharmacy for coverage information.    Covid-19: Completed 2/13, 3/10, & 04/09/20  Advanced directives: Please bring a copy of your health care power of attorney and living will to the office at your convenience.  Conditions/risks identified: Keep track of iron levels   Next appointment: Follow up in one year for your annual wellness visit.   Preventive Care 68 Years and Older, Male Preventive care refers to lifestyle choices and visits with your health care provider that can promote health and wellness. What does preventive care include? A yearly physical exam. This is also called an annual well check. Dental exams once or twice a year. Routine eye exams. Ask your health care provider how often you should have your eyes checked. Personal lifestyle choices, including: Daily care of your teeth and gums. Regular physical activity. Eating a healthy diet. Avoiding tobacco and drug use. Limiting alcohol use. Practicing safe sex. Taking low doses of aspirin every day. Taking vitamin and mineral supplements as recommended by your health care provider. What happens during an annual well check? The services and screenings done by your health care provider during your annual well check will depend on your age, overall health, lifestyle risk  factors, and family history of disease. Counseling  Your health care provider may ask you questions about your: Alcohol use. Tobacco use. Drug use. Emotional well-being. Home and relationship well-being. Sexual activity. Eating habits. History of falls. Memory and ability to understand (cognition). Work and work Statistician. Screening  You may have the following tests or measurements: Height, weight, and BMI. Blood pressure. Lipid and cholesterol levels. These may be checked every 5 years, or more frequently if you are over 69 years old. Skin check. Lung cancer screening. You may have this screening every year starting at age 69 if you have a 30-pack-year history of smoking and currently smoke or have quit within the past 15 years. Fecal occult blood test (FOBT) of the stool. You may have this test every year starting at age 30. Flexible sigmoidoscopy or colonoscopy. You may have a sigmoidoscopy every 5 years or a colonoscopy every 10 years starting at age 71. Prostate cancer screening. Recommendations will vary depending on your family history and other risks. Hepatitis C blood test. Hepatitis B blood test. Sexually transmitted disease (STD) testing. Diabetes screening. This is done by checking your blood sugar (glucose) after you have not eaten for a while (fasting). You may have this done every 1-3 years. Abdominal aortic aneurysm (AAA) screening. You may need this if you are a current or former smoker. Osteoporosis. You may be screened starting at age 51 if you are at high risk. Talk with your health care provider about your test results, treatment options, and if necessary, the need for more tests. Vaccines  Your health care provider may recommend certain vaccines, such as: Influenza vaccine. This is recommended every year. Tetanus, diphtheria, and acellular pertussis (Tdap, Td) vaccine. You may  need a Td booster every 10 years. Zoster vaccine. You may need this after age  35. Pneumococcal 13-valent conjugate (PCV13) vaccine. One dose is recommended after age 78. Pneumococcal polysaccharide (PPSV23) vaccine. One dose is recommended after age 72. Talk to your health care provider about which screenings and vaccines you need and how often you need them. This information is not intended to replace advice given to you by your health care provider. Make sure you discuss any questions you have with your health care provider. Document Released: 05/14/2015 Document Revised: 01/05/2016 Document Reviewed: 02/16/2015 Elsevier Interactive Patient Education  2017 [place] Prevention in the Home Falls can cause injuries. They can happen to people of all ages. There are many things you can do to make your home safe and to help prevent falls. What can I do on the outside of my home? Regularly fix the edges of walkways and driveways and fix any cracks. Remove anything that might make you trip as you walk through a door, such as a raised step or threshold. Trim any bushes or trees on the path to your home. Use bright outdoor lighting. Clear any walking paths of anything that might make someone trip, such as rocks or tools. Regularly check to see if handrails are loose or broken. Make sure that both sides of any steps have handrails. Any raised decks and porches should have guardrails on the edges. Have any leaves, snow, or ice cleared regularly. Use sand or salt on walking paths during winter. Clean up any spills in your garage right away. This includes oil or grease spills. What can I do in the bathroom? Use night lights. Install grab bars by the toilet and in the tub and shower. Do not use towel bars as grab bars. Use non-skid mats or decals in the tub or shower. If you need to sit down in the shower, use a plastic, non-slip stool. Keep the floor dry. Clean up any water that spills on the floor as soon as it happens. Remove soap buildup in the tub or shower  regularly. Attach bath mats securely with double-sided non-slip rug tape. Do not have throw rugs and other things on the floor that can make you trip. What can I do in the bedroom? Use night lights. Make sure that you have a light by your bed that is easy to reach. Do not use any sheets or blankets that are too big for your bed. They should not hang down onto the floor. Have a firm chair that has side arms. You can use this for support while you get dressed. Do not have throw rugs and other things on the floor that can make you trip. What can I do in the kitchen? Clean up any spills right away. Avoid walking on wet floors. Keep items that you use a lot in easy-to-reach places. If you need to reach something above you, use a strong step stool that has a grab bar. Keep electrical cords out of the way. Do not use floor polish or wax that makes floors slippery. If you must use wax, use non-skid floor wax. Do not have throw rugs and other things on the floor that can make you trip. What can I do with my stairs? Do not leave any items on the stairs. Make sure that there are handrails on both sides of the stairs and use them. Fix handrails that are broken or loose. Make sure that handrails are as long as the stairways.  Check any carpeting to make sure that it is firmly attached to the stairs. Fix any carpet that is loose or worn. Avoid having throw rugs at the top or bottom of the stairs. If you do have throw rugs, attach them to the floor with carpet tape. Make sure that you have a light switch at the top of the stairs and the bottom of the stairs. If you do not have them, ask someone to add them for you. What else can I do to help prevent falls? Wear shoes that: Do not have high heels. Have rubber bottoms. Are comfortable and fit you well. Are closed at the toe. Do not wear sandals. If you use a stepladder: Make sure that it is fully opened. Do not climb a closed stepladder. Make sure that  both sides of the stepladder are locked into place. Ask someone to hold it for you, if possible. Clearly mark and make sure that you can see: Any grab bars or handrails. First and last steps. Where the edge of each step is. Use tools that help you move around (mobility aids) if they are needed. These include: Canes. Walkers. Scooters. Crutches. Turn on the lights when you go into a dark area. Replace any light bulbs as soon as they burn out. Set up your furniture so you have a clear path. Avoid moving your furniture around. If any of your floors are uneven, fix them. If there are any pets around you, be aware of where they are. Review your medicines with your doctor. Some medicines can make you feel dizzy. This can increase your chance of falling. Ask your doctor what other things that you can do to help prevent falls. This information is not intended to replace advice given to you by your health care provider. Make sure you discuss any questions you have with your health care provider. Document Released: 02/11/2009 Document Revised: 09/23/2015 Document Reviewed: 05/22/2014 Elsevier Interactive Patient Education  2017 [employer].

## 2020-11-19 DIAGNOSIS — H401131 Primary open-angle glaucoma, bilateral, mild stage: Secondary | ICD-10-CM | POA: Diagnosis not present

## 2020-11-19 DIAGNOSIS — H2513 Age-related nuclear cataract, bilateral: Secondary | ICD-10-CM | POA: Diagnosis not present

## 2020-11-19 DIAGNOSIS — H31011 Macula scars of posterior pole (postinflammatory) (post-traumatic), right eye: Secondary | ICD-10-CM | POA: Diagnosis not present

## 2020-12-02 ENCOUNTER — Other Ambulatory Visit (INDEPENDENT_AMBULATORY_CARE_PROVIDER_SITE_OTHER): Payer: Medicare Other

## 2020-12-02 ENCOUNTER — Other Ambulatory Visit: Payer: Self-pay

## 2020-12-02 DIAGNOSIS — D509 Iron deficiency anemia, unspecified: Secondary | ICD-10-CM

## 2020-12-02 LAB — CBC WITH DIFFERENTIAL/PLATELET
Basophils Absolute: 0.1 10*3/uL (ref 0.0–0.1)
Basophils Relative: 1.3 % (ref 0.0–3.0)
Eosinophils Absolute: 0.2 10*3/uL (ref 0.0–0.7)
Eosinophils Relative: 4.3 % (ref 0.0–5.0)
HCT: 43.1 % (ref 39.0–52.0)
Hemoglobin: 14.6 g/dL (ref 13.0–17.0)
Lymphocytes Relative: 40.9 % (ref 12.0–46.0)
Lymphs Abs: 2.3 10*3/uL (ref 0.7–4.0)
MCHC: 33.9 g/dL (ref 30.0–36.0)
MCV: 84.6 fl (ref 78.0–100.0)
Monocytes Absolute: 0.7 10*3/uL (ref 0.1–1.0)
Monocytes Relative: 12.1 % — ABNORMAL HIGH (ref 3.0–12.0)
Neutro Abs: 2.3 10*3/uL (ref 1.4–7.7)
Neutrophils Relative %: 41.4 % — ABNORMAL LOW (ref 43.0–77.0)
Platelets: 134 10*3/uL — ABNORMAL LOW (ref 150.0–400.0)
RBC: 5.09 Mil/uL (ref 4.22–5.81)
RDW: 22 % — ABNORMAL HIGH (ref 11.5–15.5)
WBC: 5.6 10*3/uL (ref 4.0–10.5)

## 2020-12-02 LAB — FERRITIN: Ferritin: 38.7 ng/mL (ref 22.0–322.0)

## 2020-12-22 ENCOUNTER — Encounter: Payer: Self-pay | Admitting: Family Medicine

## 2020-12-23 ENCOUNTER — Emergency Department (HOSPITAL_BASED_OUTPATIENT_CLINIC_OR_DEPARTMENT_OTHER): Payer: Medicare Other

## 2020-12-23 ENCOUNTER — Emergency Department (HOSPITAL_BASED_OUTPATIENT_CLINIC_OR_DEPARTMENT_OTHER)
Admission: EM | Admit: 2020-12-23 | Discharge: 2020-12-23 | Disposition: A | Discharge status: Home / Self Care | Payer: Medicare Other | Attending: Emergency Medicine | Admitting: Emergency Medicine

## 2020-12-23 ENCOUNTER — Encounter (HOSPITAL_BASED_OUTPATIENT_CLINIC_OR_DEPARTMENT_OTHER): Payer: Self-pay | Admitting: *Deleted

## 2020-12-23 ENCOUNTER — Other Ambulatory Visit: Payer: Self-pay

## 2020-12-23 DIAGNOSIS — E785 Hyperlipidemia, unspecified: Secondary | ICD-10-CM | POA: Insufficient documentation

## 2020-12-23 DIAGNOSIS — Z7982 Long term (current) use of aspirin: Secondary | ICD-10-CM | POA: Diagnosis not present

## 2020-12-23 DIAGNOSIS — E1139 Type 2 diabetes mellitus with other diabetic ophthalmic complication: Secondary | ICD-10-CM | POA: Insufficient documentation

## 2020-12-23 DIAGNOSIS — R519 Headache, unspecified: Secondary | ICD-10-CM | POA: Diagnosis not present

## 2020-12-23 DIAGNOSIS — Z85828 Personal history of other malignant neoplasm of skin: Secondary | ICD-10-CM | POA: Insufficient documentation

## 2020-12-23 DIAGNOSIS — I10 Essential (primary) hypertension: Secondary | ICD-10-CM | POA: Diagnosis not present

## 2020-12-23 DIAGNOSIS — Z87891 Personal history of nicotine dependence: Secondary | ICD-10-CM | POA: Diagnosis not present

## 2020-12-23 DIAGNOSIS — Z79899 Other long term (current) drug therapy: Secondary | ICD-10-CM | POA: Insufficient documentation

## 2020-12-23 DIAGNOSIS — E1169 Type 2 diabetes mellitus with other specified complication: Secondary | ICD-10-CM | POA: Insufficient documentation

## 2020-12-23 LAB — CBC WITH DIFFERENTIAL/PLATELET
Abs Immature Granulocytes: 0.02 10*3/uL (ref 0.00–0.07)
Basophils Absolute: 0.1 10*3/uL (ref 0.0–0.1)
Basophils Relative: 1 %
Eosinophils Absolute: 0.2 10*3/uL (ref 0.0–0.5)
Eosinophils Relative: 3 %
HCT: 45.5 % (ref 39.0–52.0)
Hemoglobin: 15.9 g/dL (ref 13.0–17.0)
Immature Granulocytes: 0 %
Lymphocytes Relative: 39 %
Lymphs Abs: 2.1 10*3/uL (ref 0.7–4.0)
MCH: 29.8 pg (ref 26.0–34.0)
MCHC: 34.9 g/dL (ref 30.0–36.0)
MCV: 85.2 fL (ref 80.0–100.0)
Monocytes Absolute: 0.6 10*3/uL (ref 0.1–1.0)
Monocytes Relative: 11 %
Neutro Abs: 2.5 10*3/uL (ref 1.7–7.7)
Neutrophils Relative %: 46 %
Platelets: 138 10*3/uL — ABNORMAL LOW (ref 150–400)
RBC: 5.34 MIL/uL (ref 4.22–5.81)
RDW: 17.9 % — ABNORMAL HIGH (ref 11.5–15.5)
WBC: 5.4 10*3/uL (ref 4.0–10.5)
nRBC: 0 % (ref 0.0–0.2)

## 2020-12-23 LAB — BASIC METABOLIC PANEL
Anion gap: 10 (ref 5–15)
BUN: 18 mg/dL (ref 8–23)
CO2: 24 mmol/L (ref 22–32)
Calcium: 9.9 mg/dL (ref 8.9–10.3)
Chloride: 99 mmol/L (ref 98–111)
Creatinine, Ser: 1.11 mg/dL (ref 0.61–1.24)
GFR, Estimated: >60 mL/min (ref >60)
Glucose, Bld: 91 mg/dL (ref 70–99)
Potassium: 3.8 mmol/L (ref 3.5–5.1)
Sodium: 133 mmol/L — ABNORMAL LOW (ref 135–145)

## 2020-12-23 MED ORDER — DIPHENHYDRAMINE HCL 50 MG/ML IJ SOLN
12.5000 mg | Freq: Once | INTRAMUSCULAR | Status: AC
  Administered 2020-12-23: 12.5 mg via INTRAVENOUS
  Filled 2020-12-23: qty 1

## 2020-12-23 MED ORDER — METOCLOPRAMIDE HCL 5 MG/ML IJ SOLN
10.0000 mg | Freq: Once | INTRAMUSCULAR | Status: AC
  Administered 2020-12-23: 10 mg via INTRAVENOUS
  Filled 2020-12-23: qty 2

## 2020-12-23 MED ORDER — SODIUM CHLORIDE 0.9 % IV BOLUS
1000.0000 mL | Freq: Once | INTRAVENOUS | Status: AC
  Administered 2020-12-23: 1000 mL via INTRAVENOUS

## 2020-12-23 MED ORDER — SODIUM CHLORIDE 0.9 % IV SOLN: INTRAVENOUS | Status: DC

## 2020-12-23 NOTE — ED Provider Notes (Signed)
[facility] EMERGENCY DEPT Provider Note   CSN: [id number] Arrival date & time: 12/23/20  [id number]     History Chief Complaint  Patient presents with   Headache    Dillon [name] is a 70 y.o. male.   Headache Pain location:  R temporal Quality:  Sharp Radiates to:  Eyes Severity currently:  1/10 Severity at highest:  8/10 Onset quality:  Gradual Duration:  10 days Timing:  Intermittent Progression:  Waxing and waning Chronicity:  New Relieved by:  Acetaminophen Worsened by:  Nothing Ineffective treatments:  Acetaminophen Associated symptoms: no abdominal pain, no back pain, no blurred vision, no cough, no dizziness, no ear pain, no eye pain, no facial pain, no fever, no focal weakness, no loss of balance, no myalgias, no nausea, no near-syncope, no neck pain, no neck stiffness, no numbness, no paresthesias, no photophobia, no seizures, no sore throat, no swollen glands, no syncope, no visual change, no vomiting and no weakness       Past Medical History:  Diagnosis Date   BCC (basal cell carcinoma of skin)    2008 or 2009 left shoulder   ED (erectile dysfunction)    Elevated liver function tests    2020 labs normal- issues in past    Family history of bladder cancer    Family history of prostate cancer    Hypertension     Patient Active Problem List   Diagnosis Date Noted   Genetic testing 02/02/2020   Family history of prostate cancer    Family history of bladder cancer    Family history of genetic disease 11/26/2019   Hyperlipidemia, unspecified 05/06/2018   Insomnia 05/06/2018   GERD (gastroesophageal reflux disease) 05/06/2018   Former smoker 05/06/2018   Diabetes mellitus without complication ([place]) Q000111Q   Morbid obesity ([place]) 05/09/2016   OSA (obstructive sleep apnea) 11/30/2015   Elevated PSA 08/25/2015   Mild stage glaucoma(365.71) 02/12/2011   Venous tributary (branch) occlusion of retina 02/12/2011   ERECTILE DYSFUNCTION  05/07/2008   Essential hypertension 05/16/2007    Past Surgical History:  Procedure Laterality Date   WRIST GANGLION EXCISION     he reports several cysts in past       Family History  Problem Relation Age of Onset   Coronary artery disease Brother        stent. patient 7 years older than patient.    Hyperlipidemia Brother    Stroke Mother        complications after a fall   Heart attack Father        age 3. smoker   Prostate cancer Father    Hypertension Brother    Cancer Brother        small cell lung cancer died at 3   Other Brother        airplane crash   Bladder Cancer Maternal Uncle        dx mid 38s   Prostate cancer Maternal Uncle    Heart defect Son        transposition of arteries    Social History   Tobacco Use   Smoking status: Former    Packs/day: 0.20    Years: 2.00    Pack years: 0.40    Types: Cigarettes    Quit date: 05/02/1991    Years since quitting: 29.6   Smokeless tobacco: Never  Vaping Use   Vaping Use: Never used  Substance Use Topics   Alcohol use: Yes    Alcohol/week:  2.0 standard drinks    Types: 2 Standard drinks or equivalent per week    Comment: beer last night   Drug use: No    Home Medications Prior to Admission medications   Medication Sig Start Date End Date Taking? Authorizing Provider  aspirin 81 MG tablet Take 81 mg by mouth daily.   Yes [provider]  latanoprost (XALATAN) 0.005 % ophthalmic solution Place 1 drop into both eyes at bedtime.   Yes [provider]  losartan (COZAAR) 100 MG tablet TAKE ONE TABLET BY MOUTH DAILY 07/26/20  Yes Marin Olp, MD  meloxicam (MOBIC) 15 MG tablet Take 1 tablet by mouth daily. As needed 06/21/20  Yes [provider]  omeprazole (PRILOSEC) 20 MG capsule TAKE ONE CAPSULE BY MOUTH DAILY 12/18/19  Yes Marin Olp, MD  rosuvastatin (CRESTOR) 5 MG tablet Take 1 tablet (5 mg total) by mouth once a week. 11/26/19   Marin Olp, MD  Sildenafil  Citrate (VIAGRA PO) Take by mouth. As needed    [provider]  traZODone (DESYREL) 50 MG tablet Take 0.5-1 tablets (25-50 mg total) by mouth at bedtime as needed for sleep. 01/20/20   Marin Olp, MD    Allergies    Patient has no known allergies.  Review of Systems   Review of Systems  Constitutional:  Negative for chills and fever.  HENT:  Negative for ear pain and sore throat.   Eyes:  Negative for blurred vision, photophobia, pain and visual disturbance.  Respiratory:  Negative for cough and shortness of breath.   Cardiovascular:  Negative for chest pain, palpitations, syncope and near-syncope.  Gastrointestinal:  Negative for abdominal pain, nausea and vomiting.  Genitourinary:  Negative for dysuria and hematuria.  Musculoskeletal:  Negative for arthralgias, back pain, myalgias, neck pain and neck stiffness.  Skin:  Negative for color change and rash.  Neurological:  Positive for headaches. Negative for dizziness, focal weakness, seizures, syncope, weakness, numbness, paresthesias and loss of balance.  All other systems reviewed and are negative.  Physical Exam Updated Vital Signs BP 129/80 (BP Location: Left Arm)   Pulse 80   Temp 98.5 F (36.9 C) (Oral)   Resp 16   Ht 6' (1.829 m)   Wt 115.7 kg   SpO2 97%   BMI 34.58 kg/m   Physical Exam Vitals and nursing note reviewed.  Constitutional:      Appearance: He is well-developed.  HENT:     Head: Normocephalic and atraumatic.     Comments: No temporal arterial tenderness Eyes:     Conjunctiva/sclera: Conjunctivae normal.  Cardiovascular:     Rate and Rhythm: Normal rate and regular rhythm.     Heart sounds: No murmur heard. Pulmonary:     Effort: Pulmonary effort is normal. No respiratory distress.     Breath sounds: Normal breath sounds.  Abdominal:     Palpations: Abdomen is soft.     Tenderness: There is no abdominal tenderness.  Musculoskeletal:     Cervical back: Neck supple.  Skin:     General: Skin is warm and dry.  Neurological:     Mental Status: He is alert and oriented to person, place, and time.     GCS: GCS eye subscore is 4. GCS verbal subscore is 5. GCS motor subscore is 6.     Cranial Nerves: No cranial nerve deficit, dysarthria or facial asymmetry.     Sensory: No sensory deficit.     Motor:  No weakness.     Coordination: Coordination normal.     Gait: Gait normal.    ED Results / Procedures / Treatments   Labs (all labs ordered are listed, but only abnormal results are displayed) Labs Reviewed  BASIC METABOLIC PANEL - Abnormal; Notable for the following components:      Result Value   Sodium 133 (*)    All other components within normal limits  CBC WITH DIFFERENTIAL/PLATELET - Abnormal; Notable for the following components:   RDW 17.9 (*)    Platelets 138 (*)    All other components within normal limits    EKG None  Radiology CT HEAD WO CONTRAST  Result Date: 12/23/2020 CLINICAL DATA:  Headache EXAM: CT HEAD WITHOUT CONTRAST TECHNIQUE: Contiguous axial images were obtained from the base of the skull through the vertex without intravenous contrast. COMPARISON:  None. FINDINGS: Brain: No evidence of acute infarction, hemorrhage, hydrocephalus, extra-axial collection or mass lesion/mass effect. Vascular: No hyperdense vessel or unexpected calcification. Skull: Normal. Negative for fracture or focal lesion. Sinuses/Orbits: No acute finding. Other: None. IMPRESSION: No acute intracranial abnormalities. Electronically Signed   By: Yetta Glassman M.D.   On: 12/23/2020 10:46    Procedures Procedures   Medications Ordered in ED Medications  sodium chloride 0.9 % bolus 1,000 mL (0 mLs Intravenous Stopped 12/23/20 1157)  metoCLOPramide (REGLAN) injection 10 mg (10 mg Intravenous Given 12/23/20 1047)  diphenhydrAMINE (BENADRYL) injection 12.5 mg (12.5 mg Intravenous Given 12/23/20 1049)    ED Course  I have reviewed the triage vital signs and the nursing  notes.  Pertinent labs & imaging results that were available during my care of the patient were reviewed by me and considered in my medical decision making (see chart for details).    MDM Rules/Calculators/A&P                           ADILSON VANNORT is a 69 y.o. male who presents with headache as per above. I have reviewed the nursing documentation for past medical history, family history, and social history. I have reviewed the EMR and have learned that the patient has a history of HTN.  He is awake, alert, GCS 15, HDS, and he is afebrile. His exam is most notable for normal gait, fully intact extraocular motions with bilaterally reactive pupils, no focal neurologic deficits, no meningismus, and no temporal tenderness. There is no rash. The headache was not sudden onset or the worst headache of the patient's life. There is no visual deficit.  I am most concerned for tension type headache vs cluster headache. The patient did endorse clusters of short lasting headache with some eye lacrimation when questioned. He has no lacrimation currently and his headache has mostly resolved to a 1/10. He is worried about intracranial mass due to his brother passing away from a tumor in his brain a few years ago.  To further evaluate and risk stratify him, labs and imaging were obtained, which were significant for:  Labs: No leukocytosis, anemia. Mild thrombocytopenia to 138. BMP with mild hyponatremia to 133, otherwise WNL. Imaging: CTH with no acute intracranial abnormality  I do not think the patient has an aneurysm, intracranial bleed, mass lesion, meningitis, temporal arteritis, stroke, cluster headache, idiopathic intracranial hypertension, cavernous sinus thrombosis, carbon monoxide toxicity, herpes zoster, carotid or vertebral artery dissection, or acute angle close glaucoma.  On reassessment, the patient remained well appearing and was again able to ambulate without  difficulty and did not have any  focal neurologic deficits.  I believe the patient is stable for discharge.  We participated in shared decision making regarding continued observation in the ED versus discharge home for continued recovery after receiving the above medications. He preferred to recover at home. I believe that this is safe and reasonable.  We have discussed the diagnosis and risks, and we agree with discharging home to follow-up with their primary doctor. We also discussed returning to the Emergency Department immediately if new or worsening symptoms occur. We have discussed the symptoms which are most concerning (e.g., changing or worsening pain, weakness, vomiting, fever, or abnormal sensation) that necessitate immediate return. I provided ED return precautions. The patient felt safe with this plan.  ED Medication Summary: Medications  sodium chloride 0.9 % bolus 1,000 mL (0 mLs Intravenous Stopped 12/23/20 1157)  metoCLOPramide (REGLAN) injection 10 mg (10 mg Intravenous Given 12/23/20 1047)  diphenhydrAMINE (BENADRYL) injection 12.5 mg (12.5 mg Intravenous Given 12/23/20 1049)    Final Clinical Impression(s) / ED Diagnoses Final diagnoses:  Acute nonintractable headache, unspecified headache type    Rx / DC Orders ED Discharge Orders     None        Regan Lemming, MD 12/23/20 2001

## 2020-12-23 NOTE — ED Triage Notes (Signed)
Intermittent headache for a week and a half or two and is getting intense.  Denies, n/v or blurred vision.  No History of migraine.

## 2020-12-23 NOTE — ED Notes (Signed)
Patient verbalizes understanding of discharge instructions. Opportunity for questioning and answers were provided. Patient discharged from ED.  °

## 2020-12-24 ENCOUNTER — Encounter: Payer: Self-pay | Admitting: Family Medicine

## 2020-12-24 ENCOUNTER — Telehealth: Payer: Self-pay

## 2020-12-24 ENCOUNTER — Telehealth (INDEPENDENT_AMBULATORY_CARE_PROVIDER_SITE_OTHER): Payer: Medicare Other | Admitting: Physician Assistant

## 2020-12-24 ENCOUNTER — Encounter: Payer: Self-pay | Admitting: Physician Assistant

## 2020-12-24 VITALS — BP 138/91 | HR 110 | Temp 98.0°F | Ht 72.0 in | Wt 255.0 lb

## 2020-12-24 DIAGNOSIS — U071 COVID-19: Secondary | ICD-10-CM

## 2020-12-24 DIAGNOSIS — R519 Headache, unspecified: Secondary | ICD-10-CM | POA: Diagnosis not present

## 2020-12-24 DIAGNOSIS — I10 Essential (primary) hypertension: Secondary | ICD-10-CM | POA: Diagnosis not present

## 2020-12-24 MED ORDER — ZOLPIDEM TARTRATE 5 MG PO TABS: 5.0000 mg | ORAL_TABLET | Freq: Every evening | ORAL | 0 refills | Status: DC | PRN

## 2020-12-24 NOTE — Telephone Encounter (Signed)
Pt called would like to speak to Butch Penny again possibly. Pt would like Dr Yong Channel would call [name] in for him. I told pt that Dr Yong Channel would not call in the medication because has not been seen. He stated that he was hoping he would be able to call something in for him since he does have covid.

## 2020-12-24 NOTE — Telephone Encounter (Signed)
I sent some Ambien for patient-see my chart message

## 2020-12-24 NOTE — Progress Notes (Signed)
Virtual Visit via Video   I connected with Dillon [name] on 12/24/20 at 10:00 AM EDT by a video enabled telemedicine application and verified that I am speaking with the correct person using two identifiers. Location patient: Home Location provider: [facility], Office Persons participating in the virtual visit: Dillon [name], Dillon [name], Dillon Pickler, LPN   I discussed the limitations of evaluation and management by telemedicine and the availability of in person appointments. The patient expressed understanding and agreed to proceed.  I acted as a Education administrator for [employer], [employer], LPN   Subjective:   HPI:   COVID-19 A week ago patient developed HA and fatigue. Denies any known exposure. Went to [facility] ER yesterday had negative CT of head and unremarkable labs.  Was given reglan, benadryl and fluids with improvement of symptoms. BP was normal.  Headache, fatigue. Pt was seen in the ED yesterday and treated for Migraine but headache came back today. He performed home covid test and he was positive. Patient did have COVID in May -- took molnipuravir as directed during that time  Denies: fever, chills, nausea, vomiting, vision changes, weakness on one side of the body  Has trialed tylenol with limited success of symptom improvement.  Patient risk factors: Current [id number] risk of complications score: 5 Smoking status: Dillon [name]  reports that he quit smoking about 29 years ago. His smoking use included cigarettes. He has a 0.40 pack-year smoking history. He has never used smokeless tobacco. If male, currently pregnant? '[]'$   Yes '[]'$   No  ROS: See pertinent positives and negatives per HPI.  Patient Active Problem List   Diagnosis Date Noted   Genetic testing 02/02/2020   Family history of prostate cancer    Family history of bladder cancer    Family history of genetic disease 11/26/2019   Hyperlipidemia, unspecified 05/06/2018   Insomnia  05/06/2018   GERD (gastroesophageal reflux disease) 05/06/2018   Former smoker 05/06/2018   Diabetes mellitus without complication ([place]) Q000111Q   Morbid obesity ([place]) 05/09/2016   OSA (obstructive sleep apnea) 11/30/2015   Elevated PSA 08/25/2015   Mild stage glaucoma(365.71) 02/12/2011   Venous tributary (branch) occlusion of retina 02/12/2011   ERECTILE DYSFUNCTION 05/07/2008   Essential hypertension 05/16/2007    Social History   Tobacco Use   Smoking status: Former    Packs/day: 0.20    Years: 2.00    Pack years: 0.40    Types: Cigarettes    Quit date: 05/02/1991    Years since quitting: 29.6   Smokeless tobacco: Never  Substance Use Topics   Alcohol use: Yes    Alcohol/week: 2.0 standard drinks    Types: 2 Standard drinks or equivalent per week    Comment: beer last night    Current Outpatient Medications:    aspirin 81 MG tablet, Take 81 mg by mouth daily., Disp: , Rfl:    latanoprost (XALATAN) 0.005 % ophthalmic solution, Place 1 drop into both eyes at bedtime., Disp: , Rfl:    losartan (COZAAR) 100 MG tablet, TAKE ONE TABLET BY MOUTH DAILY, Disp: 90 tablet, Rfl: 3   meloxicam (MOBIC) 15 MG tablet, Take 1 tablet by mouth daily. As needed, Disp: , Rfl:    omeprazole (PRILOSEC) 20 MG capsule, TAKE ONE CAPSULE BY MOUTH DAILY, Disp: 90 capsule, Rfl: 3   rosuvastatin (CRESTOR) 5 MG tablet, Take 1 tablet (5 mg total) by mouth once a week., Disp: 13 tablet, Rfl: 3  Sildenafil Citrate (VIAGRA PO), Take by mouth. As needed, Disp: , Rfl:    traZODone (DESYREL) 50 MG tablet, Take 0.5-1 tablets (25-50 mg total) by mouth at bedtime as needed for sleep., Disp: 30 tablet, Rfl: 3  No Known Allergies  Objective:   VITALS: Per patient if applicable, see vitals. GENERAL: Alert, appears well and in no acute distress. HEENT: Atraumatic, conjunctiva clear, no obvious abnormalities on inspection of external nose and ears. NECK: Normal movements of the head and  neck. CARDIOPULMONARY: No increased WOB. Speaking in clear sentences. I:E ratio WNL.  MS: Moves all visible extremities without noticeable abnormality. PSYCH: Pleasant and cooperative, well-groomed. Speech normal rate and rhythm. Affect is appropriate. Insight and judgement are appropriate. Attention is focused, linear, and appropriate.  NEURO: CN grossly intact. Oriented as arrived to appointment on time with no prompting. Moves both UE equally.  SKIN: No obvious lesions, wounds, erythema, or cyanosis noted on face or hands.  Assessment and Plan:   Dillon [name] was seen today for covid positive.  Diagnoses and all orders for this visit:  COVID-19; Acute nonintractable headache, unspecified headache type; Essential hypertension No red flags on discussion, patient is not in any obvious distress during our visit. Discussed progression of most viral illnesses, and recommended supportive care at this point in time.  I did recommend that he start pushing fluids.  He has not taken his blood pressure medication yet (losartan 100 mg)  recommended that he take this and monitor BP to make sure that it improves. If BP improves, may hold his ASA and take excedrin migraine. If BP remains elevated, may trial tylenol arthritis, but needs to keep close watch on BP to make sure that this improves and is not culprit of his HA. BP was normal in ER yesterday and CT negative for CVA, however will need to ensure improvement of BP to make sure there is nothing else going on. Follow-up next week in office if BP remains above goal. If BP does not improve and headache worsens, needs to go to the ER ASAP.  Reviewed return precautions including new/worsening fever, SOB, new/worsening cough, sudden onset changes of symptoms. Recommended need to self-quarantine and practice social distancing until symptoms resolve. I recommend that patient follow-up if symptoms worsen or persist despite treatment x 7-10 days, sooner if  needed.  I discussed the assessment and treatment plan with the patient. The patient was provided an opportunity to ask questions and all were answered. The patient agreed with the plan and demonstrated an understanding of the instructions.   The patient was advised to call back or seek an in-person evaluation if the symptoms worsen or if the condition fails to improve as anticipated.   [place], [place] 12/24/2020

## 2020-12-24 NOTE — Telephone Encounter (Signed)
Dr. Yong Channel, pt wants to know if you would send him in 2-3 tablets of Ambien for hm to try to get some sleep. Pt said Trazodone is not helping. Told pt I would send you a message and to check with the pharmacy if sent in later. Pt verbalized understanding.

## 2020-12-24 NOTE — Progress Notes (Deleted)
Dillon [name] is a 70 y.o. male here for a new problem.  I acted as a Education administrator for [employer], PA-C [employer], LPN   History of Present Illness:   No chief complaint on file.   HPI  Headaches    Past Medical History:  Diagnosis Date   BCC (basal cell carcinoma of skin)    2008 or 2009 left shoulder   ED (erectile dysfunction)    Elevated liver function tests    2020 labs normal- issues in past    Family history of bladder cancer    Family history of prostate cancer    Hypertension      Social History   Tobacco Use   Smoking status: Former    Packs/day: 0.20    Years: 2.00    Pack years: 0.40    Types: Cigarettes    Quit date: 05/02/1991    Years since quitting: 29.6   Smokeless tobacco: Never  Vaping Use   Vaping Use: Never used  Substance Use Topics   Alcohol use: Yes    Alcohol/week: 2.0 standard drinks    Types: 2 Standard drinks or equivalent per week    Comment: beer last night   Drug use: No    Past Surgical History:  Procedure Laterality Date   WRIST GANGLION EXCISION     he reports several cysts in past    Family History  Problem Relation Age of Onset   Coronary artery disease Brother        stent. patient 81 years older than patient.    Hyperlipidemia Brother    Stroke Mother        complications after a fall   Heart attack Father        age 12. smoker   Prostate cancer Father    Hypertension Brother    Cancer Brother        small cell lung cancer died at 30   Other Brother        airplane crash   Bladder Cancer Maternal Uncle        dx mid 65s   Prostate cancer Maternal Uncle    Heart defect Son        transposition of arteries    No Known Allergies  Current Medications:   Current Outpatient Medications:    aspirin 81 MG tablet, Take 81 mg by mouth daily., Disp: , Rfl:    latanoprost (XALATAN) 0.005 % ophthalmic solution, Place 1 drop into both eyes at bedtime., Disp: , Rfl:    losartan (COZAAR) 100 MG tablet, TAKE  ONE TABLET BY MOUTH DAILY, Disp: 90 tablet, Rfl: 3   meloxicam (MOBIC) 15 MG tablet, Take 1 tablet by mouth daily. As needed, Disp: , Rfl:    omeprazole (PRILOSEC) 20 MG capsule, TAKE ONE CAPSULE BY MOUTH DAILY, Disp: 90 capsule, Rfl: 3   rosuvastatin (CRESTOR) 5 MG tablet, Take 1 tablet (5 mg total) by mouth once a week., Disp: 13 tablet, Rfl: 3   Sildenafil Citrate (VIAGRA PO), Take by mouth. As needed, Disp: , Rfl:    traZODone (DESYREL) 50 MG tablet, Take 0.5-1 tablets (25-50 mg total) by mouth at bedtime as needed for sleep., Disp: 30 tablet, Rfl: 3   Review of Systems:   ROS  Vitals:   There were no vitals filed for this visit.   There is no height or weight on file to calculate BMI.  Physical Exam:   Physical Exam  Results for orders  placed or performed during the hospital encounter of Q000111Q  Basic metabolic panel  Result Value Ref Range   Sodium 133 (L) 135 - 145 mmol/L   Potassium 3.8 3.5 - 5.1 mmol/L   Chloride 99 98 - 111 mmol/L   CO2 24 22 - 32 mmol/L   Glucose, Bld 91 70 - 99 mg/dL   BUN 18 8 - 23 mg/dL   Creatinine, Ser 1.11 0.61 - 1.24 mg/dL   Calcium 9.9 8.9 - 10.3 mg/dL   GFR, Estimated >60 >60 mL/min   Anion gap 10 5 - 15  CBC with Differential/Platelet  Result Value Ref Range   WBC 5.4 4.0 - 10.5 K/uL   RBC 5.34 4.22 - 5.81 MIL/uL   Hemoglobin 15.9 13.0 - 17.0 g/dL   HCT 45.5 39.0 - 52.0 %   MCV 85.2 80.0 - 100.0 fL   MCH 29.8 26.0 - 34.0 pg   MCHC 34.9 30.0 - 36.0 g/dL   RDW 17.9 (H) 11.5 - 15.5 %   Platelets 138 (L) 150 - 400 K/uL   nRBC 0.0 0.0 - 0.2 %   Neutrophils Relative % 46 %   Neutro Abs 2.5 1.7 - 7.7 K/uL   Lymphocytes Relative 39 %   Lymphs Abs 2.1 0.7 - 4.0 K/uL   Monocytes Relative 11 %   Monocytes Absolute 0.6 0.1 - 1.0 K/uL   Eosinophils Relative 3 %   Eosinophils Absolute 0.2 0.0 - 0.5 K/uL   Basophils Relative 1 %   Basophils Absolute 0.1 0.0 - 0.1 K/uL   Immature Granulocytes 0 %   Abs Immature Granulocytes 0.02 0.00 -  0.07 K/uL    Assessment and Plan:   There are no diagnoses linked to this encounter.    ***  Inda Coke, PA-C

## 2020-12-24 NOTE — Telephone Encounter (Signed)
Called pt back, he checked his blood pressure at 11:50 132/92, pulse 105, and then again at 12:10 147/87, pulse 97. Asked pt if he has a headache now? Pt said now but he feels it coming back. Told pt to continue to monitor if blood pressure consistently great than 140/90 and any increase in headache, dizziness or blurred vision need to go to ED or Urgent Care. Pt verbalized understanding.

## 2020-12-24 NOTE — Addendum Note (Signed)
Addended by: Marin Olp on: 12/24/2020 09:55 PM   Modules accepted: Orders

## 2020-12-24 NOTE — Telephone Encounter (Signed)
Pt called requesting to speak to Butch Penny.

## 2020-12-27 ENCOUNTER — Other Ambulatory Visit: Payer: Medicare Other

## 2020-12-31 ENCOUNTER — Telehealth: Payer: Self-pay | Admitting: Pharmacist

## 2020-12-31 ENCOUNTER — Other Ambulatory Visit: Payer: Self-pay | Admitting: Family Medicine

## 2020-12-31 NOTE — Chronic Care Management (AMB) (Addendum)
    Chronic Care Management Pharmacy Assistant   Name: Dillon [name]  MRN: [id number] DOB: 1951-04-02   Reason for Encounter: General Adherence Call    Recent office visits:  12/24/2020 VV Inda Coke, [place]; If BP remains elevated, may trial tylenol arthritis, but needs to keep close watch on BP to make sure that this improves and is not culprit of his HA. BP was normal in ER yesterday and CT negative for CVA, however will need to ensure improvement of BP to make sure there is nothing else going on  Recent consult visits:  11/19/2020 OV (optometry) Andria Frames, Sharrell Ku, OD; Start new medication for Glaucoma  latanoprost (XALATAN) 0.005 % ophthalmic solution  Sig: Place 1 drop into both eyes nightly.  Hospital visits:  12/23/2020 ED visit for headache, no changes made to medications.  Medications: Outpatient Encounter Medications as of 12/31/2020  Medication Sig Note   aspirin 81 MG tablet Take 81 mg by mouth daily.    latanoprost (XALATAN) 0.005 % ophthalmic solution Place 1 drop into both eyes at bedtime.    losartan (COZAAR) 100 MG tablet TAKE ONE TABLET BY MOUTH DAILY    meloxicam (MOBIC) 15 MG tablet Take 1 tablet by mouth daily. As needed    omeprazole (PRILOSEC) 20 MG capsule TAKE ONE CAPSULE BY MOUTH DAILY    rosuvastatin (CRESTOR) 5 MG tablet Take 1 tablet (5 mg total) by mouth once a week.    Sildenafil Citrate (VIAGRA PO) Take by mouth. As needed 08/10/2016: Prn    traZODone (DESYREL) 50 MG tablet Take 0.5-1 tablets (25-50 mg total) by mouth at bedtime as needed for sleep.    zolpidem (AMBIEN) 5 MG tablet Take 1 tablet (5 mg total) by mouth at bedtime as needed for sleep (do not drive for 8 hours after taking).    No facility-administered encounter medications on file as of 12/31/2020.   Patient Questions: Have you had any problems recently with your health? Patient states he has not had any problems recently with his health.  Have you had any problems with your  pharmacy? Patient states he has not had any problems recently with his pharmacy.  What issues or side effects are you having with your medications? Patient states he has not had any issues or side effects with any of his medication.  What would you like me to pass along to Leata Mouse, CPP for him to help you with?  Patient states he does not have anything for me to pass along at this time.  What can we do to take care of you better? Patient did not have any suggestions.   Care Gaps: Ophthalmology Exam: last exam 11/19/2020 Zoster Vaccines- Shingrix: overdue COVID-19 Vaccination:(4- Booster for [place] series) overdue since 07/08/2020 Foot Exam: Overdue since 11/25/2020 PPSV23 Vaccination: (2 of 2- PPSV23) overdue since 11/26/2019 -Pneumococcal Conjugate-13 11/26/2019 Influenza Vaccination: Overdue since 11/30/2019 Hemoglobin A1C: Next due 04/19/2021 Colonoscopy: Next due on 12/26/2022 Tetanus/DTAP: Next due on 03/07/2023 HPV Vaccines: Aged Out  Future Appointments  Date Time Provider [place]  05/23/2021  7:30 AM Lavonna Monarch, MD [facility]  05/23/2021  8:00 AM Marin Olp, MD [facility]  12/01/2021  8:00 AM [facility] HEALTH COACH [facility]    Star Rating Drugs: Losartan 100 mg last filled 11/12/2020 90 DS  April D Calhoun, [place] Pharmacist Assistant [phone]   6 minutes spent in review, coordination, and documentation.  Reviewed by: Beverly Milch, PharmD Clinical Pharmacist [phone]

## 2020-12-31 NOTE — Telephone Encounter (Signed)
I would be fine with DEXA being ordered at [facility]  ...mammogram being ordered under screen breast cancer ...eye exams trying to get a copy of eye exam or referring to optho  Foot exams will have to be done in office by me or a CMA

## 2021-01-13 DIAGNOSIS — E119 Type 2 diabetes mellitus without complications: Secondary | ICD-10-CM | POA: Diagnosis not present

## 2021-01-13 DIAGNOSIS — H2513 Age-related nuclear cataract, bilateral: Secondary | ICD-10-CM | POA: Diagnosis not present

## 2021-01-13 DIAGNOSIS — H40053 Ocular hypertension, bilateral: Secondary | ICD-10-CM | POA: Diagnosis not present

## 2021-01-13 DIAGNOSIS — H348312 Tributary (branch) retinal vein occlusion, right eye, stable: Secondary | ICD-10-CM | POA: Diagnosis not present

## 2021-01-13 LAB — HM DIABETES EYE EXAM

## 2021-01-24 ENCOUNTER — Encounter: Payer: Self-pay | Admitting: Family Medicine

## 2021-01-31 ENCOUNTER — Other Ambulatory Visit: Payer: Self-pay | Admitting: Family Medicine

## 2021-01-31 ENCOUNTER — Other Ambulatory Visit: Payer: Self-pay

## 2021-01-31 DIAGNOSIS — D509 Iron deficiency anemia, unspecified: Secondary | ICD-10-CM

## 2021-02-01 ENCOUNTER — Other Ambulatory Visit: Payer: Self-pay

## 2021-02-01 ENCOUNTER — Ambulatory Visit (INDEPENDENT_AMBULATORY_CARE_PROVIDER_SITE_OTHER): Payer: Medicare Other | Admitting: Family Medicine

## 2021-02-01 ENCOUNTER — Encounter: Payer: Self-pay | Admitting: Family Medicine

## 2021-02-01 ENCOUNTER — Other Ambulatory Visit (INDEPENDENT_AMBULATORY_CARE_PROVIDER_SITE_OTHER): Payer: Medicare Other

## 2021-02-01 VITALS — BP 122/82 | HR 82 | Temp 98.2°F | Ht 71.0 in | Wt 261.0 lb

## 2021-02-01 DIAGNOSIS — E119 Type 2 diabetes mellitus without complications: Secondary | ICD-10-CM

## 2021-02-01 DIAGNOSIS — D509 Iron deficiency anemia, unspecified: Secondary | ICD-10-CM | POA: Diagnosis not present

## 2021-02-01 DIAGNOSIS — K219 Gastro-esophageal reflux disease without esophagitis: Secondary | ICD-10-CM | POA: Diagnosis not present

## 2021-02-01 DIAGNOSIS — E785 Hyperlipidemia, unspecified: Secondary | ICD-10-CM

## 2021-02-01 DIAGNOSIS — G4700 Insomnia, unspecified: Secondary | ICD-10-CM | POA: Diagnosis not present

## 2021-02-01 DIAGNOSIS — I10 Essential (primary) hypertension: Secondary | ICD-10-CM

## 2021-02-01 LAB — FERRITIN: Ferritin: 54.1 ng/mL (ref 22.0–322.0)

## 2021-02-01 NOTE — Patient Instructions (Addendum)
Health Maintenance Due  Topic Date Due   Zoster Vaccines- Shingrix (1 of 2) -Please check with your pharmacy to see if they have the shingrix vaccine. If they do- please get this immunization and update [place] by phone call or mychart with dates you receive the vaccine  Never done   COVID-19 Vaccine (4 - Booster for [employer] series)  I recommend you wait at least 3 months (no later than December) before getting the new Bivalent vaccination since having [place] in August. Let us know when you get this  07/02/2020   Please stop by lab before you go If you have mychart- we will send your results within 3 business days of [place] receiving them.  If you do not have mychart- we will call you about results within 5 business days of [place] receiving them.  *please also note that you will see labs on mychart as soon as they post. I will later go in and write notes on them- will say "notes from Dr. Yong Channel"  I recommend that you try to push more water intake through your system to prevent dehydration. I would also advise for you to cut out caffeine as well - stopping Coke would be very beneficial in regards to your diabetes.  If you have new or worsening symptoms let us know ASAP  Recommended follow up: Return for as needed for new, worsening, persistent symptoms. Keep January visit

## 2021-02-01 NOTE — Progress Notes (Signed)
Phone [phone] In person visit   Subjective:   Dillon [name] is a 70 y.o. year old very pleasant male patient who presents for/with See problem oriented charting Chief Complaint  Patient presents with   Headache    Intermittent headaches last 6 days . Patient states that for about 10-15 min that it can cause him more pain than normal.     This visit occurred during the SARS-CoV-2 public health emergency.  Safety protocols were in place, including screening questions prior to the visit, additional usage of staff PPE, and extensive cleaning of exam room while observing appropriate contact time as indicated for disinfecting solutions.   Past Medical History-  Patient Active Problem List   Diagnosis Date Noted   Diabetes mellitus without complication ([place]) 99/24/2683    Priority: 1.   Hyperlipidemia, unspecified 05/06/2018    Priority: 2.   GERD (gastroesophageal reflux disease) 05/06/2018    Priority: 2.   Former smoker 05/06/2018    Priority: 2.   OSA (obstructive sleep apnea) 11/30/2015    Priority: 2.   Elevated PSA 08/25/2015    Priority: 2.   Mild stage glaucoma(365.71) 02/12/2011    Priority: 2.   Venous tributary (branch) occlusion of retina 02/12/2011    Priority: 2.   Essential hypertension 05/16/2007    Priority: 2.   Insomnia 05/06/2018    Priority: 3.   ERECTILE DYSFUNCTION 05/07/2008    Priority: 3.   Genetic testing 02/02/2020   Family history of prostate cancer    Family history of bladder cancer    Family history of genetic disease 11/26/2019   Morbid obesity ([place]) 05/09/2016    Medications- reviewed and updated Current Outpatient Medications  Medication Sig Dispense Refill   aspirin 81 MG tablet Take 81 mg by mouth daily.     latanoprost (XALATAN) 0.005 % ophthalmic solution Place 1 drop into both eyes at bedtime.     losartan (COZAAR) 100 MG tablet TAKE ONE TABLET BY MOUTH DAILY 90 tablet 3   meloxicam (MOBIC) 15 MG tablet Take 1 tablet by  mouth daily. As needed     omeprazole (PRILOSEC) 20 MG capsule TAKE ONE CAPSULE BY MOUTH DAILY 90 capsule 3   rosuvastatin (CRESTOR) 5 MG tablet Take 1 tablet (5 mg total) by mouth once a week. 13 tablet 3   Sildenafil Citrate (VIAGRA PO) Take by mouth. As needed     traZODone (DESYREL) 50 MG tablet TAKE 1/2 TO 1 TABLET BY MOUTH EVERY NIGHT AT BEDTIME AS NEEDED FOR SLEEP 30 tablet 3   zolpidem (AMBIEN) 5 MG tablet Take 1 tablet (5 mg total) by mouth at bedtime as needed for sleep (do not drive for 8 hours after taking). 10 tablet 0   No current facility-administered medications for this visit.     Objective:  BP 122/82   Pulse 82   Temp 98.2 F (36.8 C) (Temporal)   Ht '5\' 11"'  (1.803 m)   Wt 261 lb (118.4 kg)   SpO2 96%   BMI 36.40 kg/m  Gen: NAD, resting comfortably CV: RRR no murmurs rubs or gallops Lungs: CTAB no crackles, wheeze, rhonchi Ext: no edema Skin: warm, dry Neuro: CN II-XII intact, sensation and reflexes normal throughout, 5/5 muscle strength in bilateral upper and lower extremities. Normal finger to nose. Normal rapid alternating movements. No pronator drift. Normal romberg. Normal gait.      Assessment and Plan  # Headaches S:Patient was seen in the ED 08/25/ss for an Acute  non-intractable headache  Labs were ordered and showed no leukocytosis, anemia. There was mild thrombocytopenia to 138. BMP with mild hyponatremia to 133, otherwise WNL.  A CT of the Head WO Contrast was also ordered and showed  no acute intracranial abnormalities.  Dr. Armandina Gemma was most concerned for tension type headache vs cluster headache - patient did endorse clusters of short lasting headache with some eye lacrimation when questioned - he had no lacrimation during that time and his headache has mostly resolved to a 1/10. Patient was worried about an intracranial mass due to brother who passed away from a tumor in his brain a few years prior.  - Patient was not thought to have an aneurysm,  intracranial bleed, mass lesion, meningitis, temporal arteritis, stroke, cluster headache, idiopathic intracranial hypertension, cavernous sinus thrombosis, carbon monoxide toxicity, herpes zoster, carotid or vertebral artery dissection, or acute angle close glaucoma. - On reassessment, he remained to appear well and was able to ambulate without difficulty and no neurological deficits.  Patient opted for discharge and outpatient follow-up.  He was not tested for COVID in the hospital  Patient was also virtually seen by [facility] on 12/24/2020 for COVID-19 Infection - he performed an at home COVID test which was positive. She recommended that he start to push fluids and start taking his Losartan 100 mg and monitor his blood pressure -  If BP improved, may hold his ASA and take excedrin migraine. If blood pressure were to remain elevated, he was to trial Tylenol arthritis and to continue to monitor and make sure it is not the cause for his headaches.  Today patient reports symptoms resolved within a few days from covid including the headaches. No headaches until about a week ago and then started noting some again.   He noted moderate level headaches on right side of head (significantly behind temple). No runny nose or watery eyes. One was triggered after some bourbon x5 Saturday night and woke up with one on Friday- this one lasted most od ay on Sunday- excedrin migraine helpful. He does reflect and could be dehydrated. Bp running higher at home. Has had 3 since that time that last for about 30 mins on right side. May happen within an hour of waking up. Not severe. Sleeping reasonably well- but not doing best with water. 16 oz plus coke and coffee.   In his adult life or childhood does not get regular headaches- may have one every few years.   No facial or extremity weakness. No slurred words or trouble swallowing. no blurry vision or double vision. No paresthesias. No confusion or word finding difficulties.    A/P: 70 year old male who developed severe headache with COVID which then resolved and then approximately 3 weeks later developed right-sided headaches (significantly behind temple area so doubt temporal arteritis- offered ESR and CRP and explained reasoning and he declines for now- knows if any blurry vision to get back in ASAP) intermittently but with 4 headaches in the last 10 days or so which is atypical for him.  He admits to not staying very well-hydrated-since he is already had a CT scan when he had COVID in late August we opted to not proceed with neuroimaging at this time-we opted to push water intake.  I am also concerned about level of caffeine intake and potential dropping of caffeine levels triggering headaches-ideally would stop Coke and coffee.  Stopping Coke would also be beneficial for diabetes. - We are also can update some blood work.  If he has new or worsening or persistent symptoms despite increasing water intake and trying to cut back on caffeine could consider further evaluation with MRI of the brain or referral to neurology-he will let me know - I do not think headaches are BP related though pain from headache or stress could raise his blood pressure  #hyperlipidemia S: Medication:Rosuvastatin 5 mg once a week, Aspirin 81 mg daily Lab Results  Component Value Date   CHOL 152 10/18/2020   HDL 29.30 (L) 10/18/2020   LDLCALC 85 10/18/2020   LDLDIRECT 69.0 06/16/2020   TRIG 187.0 (H) 10/18/2020   CHOLHDL 5 10/18/2020   A/P: reasonable control- continue current meds  #hypertension S: medication: Losartan 100 mg daily with some cough - controlled with PPI. No recent meloxicam Home readings #s: up to 140s over 110 at home with HR being higher.  BP Readings from Last 3 Encounters:  02/01/21 122/82  12/24/20 (!) 138/91  12/23/20 129/80  A/P: stable on repeat- has had some higher #s at home but has some stress he has been dealing with in the family  #  Insomnia S:medication: trazodone 50 mg as needed and Ambien 5 mg as needed  much more sparingly- only took half pill with coivd A/P: reasonable control with trazodone- only took [place] while ill with covid   # Diabetes S: Medication: Diet controlled CBGs- Did not have a meter Exercise and diet- wanted to get back to walking twice a day- some of the time has made it Lab Results  Component Value Date   HGBA1C 6.9 (H) 10/18/2020   HGBA1C 6.6 (H) 06/16/2020   HGBA1C 6.4 (H) 11/26/2019  A/P:  hopefully stable or improved- update a1c today- continue without meds   Recommended follow up: No follow-ups on file. Future Appointments  Date Time Provider [place]  02/01/2021  2:00 PM [facility]  05/23/2021  7:30 AM Lavonna Monarch, MD [facility]  05/23/2021  8:00 AM Marin Olp, MD [facility]  12/01/2021  8:00 AM [facility] HEALTH COACH [facility]   Lab/Order associations:   ICD-10-CM   1. Hyperlipidemia, unspecified hyperlipidemia type  E78.5     2. Essential hypertension  I10     3. Diabetes mellitus without complication ([place])  R56.1     4. Gastroesophageal reflux disease without esophagitis  K21.9     5. Insomnia, unspecified type  G47.00      I,Harris Phan,acting as a scribe for Garret Reddish, MD.,have documented all relevant documentation on the behalf of Garret Reddish, MD,as directed by  Garret Reddish, MD while in the presence of Garret Reddish, MD.  I, Garret Reddish, MD, have reviewed all documentation for this visit. The documentation on 02/01/21 for the exam, diagnosis, procedures, and orders are all accurate and complete.  Return precautions advised.  Garret Reddish, MD

## 2021-02-07 ENCOUNTER — Other Ambulatory Visit: Payer: Self-pay

## 2021-02-07 DIAGNOSIS — D649 Anemia, unspecified: Secondary | ICD-10-CM

## 2021-02-07 DIAGNOSIS — I10 Essential (primary) hypertension: Secondary | ICD-10-CM

## 2021-02-07 DIAGNOSIS — E119 Type 2 diabetes mellitus without complications: Secondary | ICD-10-CM

## 2021-02-08 ENCOUNTER — Telehealth: Payer: Self-pay

## 2021-02-08 NOTE — Telephone Encounter (Signed)
Tried calling the patient to get him set up for a lab only appt per dr hunter note.

## 2021-02-15 ENCOUNTER — Encounter: Payer: Self-pay | Admitting: Gastroenterology

## 2021-02-28 DIAGNOSIS — Z23 Encounter for immunization: Secondary | ICD-10-CM | POA: Diagnosis not present

## 2021-03-13 ENCOUNTER — Encounter: Payer: Self-pay | Admitting: Family Medicine

## 2021-03-18 ENCOUNTER — Other Ambulatory Visit (INDEPENDENT_AMBULATORY_CARE_PROVIDER_SITE_OTHER): Payer: Medicare Other

## 2021-03-18 DIAGNOSIS — E119 Type 2 diabetes mellitus without complications: Secondary | ICD-10-CM

## 2021-03-18 DIAGNOSIS — D649 Anemia, unspecified: Secondary | ICD-10-CM

## 2021-03-18 DIAGNOSIS — I10 Essential (primary) hypertension: Secondary | ICD-10-CM

## 2021-03-18 LAB — IBC + FERRITIN
Ferritin: 66.9 ng/mL (ref 22.0–322.0)
Iron: 210 ug/dL — ABNORMAL HIGH (ref 42–165)
Saturation Ratios: 46.4 % (ref 20.0–50.0)
TIBC: 452.2 ug/dL — ABNORMAL HIGH (ref 250.0–450.0)
Transferrin: 323 mg/dL (ref 212.0–360.0)

## 2021-03-18 LAB — CBC WITH DIFFERENTIAL/PLATELET
Basophils Absolute: 0.1 10*3/uL (ref 0.0–0.1)
Basophils Relative: 1.2 % (ref 0.0–3.0)
Eosinophils Absolute: 0.2 10*3/uL (ref 0.0–0.7)
Eosinophils Relative: 3.6 % (ref 0.0–5.0)
HCT: 45.1 % (ref 39.0–52.0)
Hemoglobin: 15.8 g/dL (ref 13.0–17.0)
Lymphocytes Relative: 44.1 % (ref 12.0–46.0)
Lymphs Abs: 2.5 10*3/uL (ref 0.7–4.0)
MCHC: 35.1 g/dL (ref 30.0–36.0)
MCV: 94.4 fl (ref 78.0–100.0)
Monocytes Absolute: 0.7 10*3/uL (ref 0.1–1.0)
Monocytes Relative: 11.9 % (ref 3.0–12.0)
Neutro Abs: 2.2 10*3/uL (ref 1.4–7.7)
Neutrophils Relative %: 39.2 % — ABNORMAL LOW (ref 43.0–77.0)
Platelets: 157 10*3/uL (ref 150.0–400.0)
RBC: 4.78 Mil/uL (ref 4.22–5.81)
RDW: 13.6 % (ref 11.5–15.5)
WBC: 5.7 10*3/uL (ref 4.0–10.5)

## 2021-03-18 LAB — HEMOGLOBIN A1C: Hgb A1c MFr Bld: 6.4 % (ref 4.6–6.5)

## 2021-03-18 LAB — COMPREHENSIVE METABOLIC PANEL
ALT: 50 U/L (ref 0–53)
AST: 36 U/L (ref 0–37)
Albumin: 4.6 g/dL (ref 3.5–5.2)
Alkaline Phosphatase: 51 U/L (ref 39–117)
BUN: 21 mg/dL (ref 6–23)
CO2: 29 mEq/L (ref 19–32)
Calcium: 9.8 mg/dL (ref 8.4–10.5)
Chloride: 97 mEq/L (ref 96–112)
Creatinine, Ser: 1.16 mg/dL (ref 0.40–1.50)
GFR: 64.56 mL/min (ref >60.00)
Glucose, Bld: 119 mg/dL — ABNORMAL HIGH (ref 70–99)
Potassium: 4.1 mEq/L (ref 3.5–5.1)
Sodium: 132 mEq/L — ABNORMAL LOW (ref 135–145)
Total Bilirubin: 1.4 mg/dL — ABNORMAL HIGH (ref 0.2–1.2)
Total Protein: 7.5 g/dL (ref 6.0–8.3)

## 2021-04-05 ENCOUNTER — Encounter: Payer: Self-pay | Admitting: Gastroenterology

## 2021-04-05 ENCOUNTER — Ambulatory Visit (INDEPENDENT_AMBULATORY_CARE_PROVIDER_SITE_OTHER): Payer: Medicare Other | Admitting: Gastroenterology

## 2021-04-05 VITALS — BP 110/80 | HR 91 | Ht 71.0 in | Wt 258.0 lb

## 2021-04-05 DIAGNOSIS — K219 Gastro-esophageal reflux disease without esophagitis: Secondary | ICD-10-CM | POA: Diagnosis not present

## 2021-04-05 DIAGNOSIS — D509 Iron deficiency anemia, unspecified: Secondary | ICD-10-CM

## 2021-04-05 MED ORDER — PLENVU 140 G PO SOLR: 1.0000 | Freq: Once | ORAL | 0 refills | Status: AC

## 2021-04-05 NOTE — Patient Instructions (Signed)
You have been scheduled for an endoscopy and colonoscopy. Please follow the written instructions given to you at your visit today. Please pick up your prep supplies at the pharmacy within the next 1-3 days. If you use inhalers (even only as needed), please bring them with you on the day of your procedure.  Due to recent changes in healthcare laws, you may see the results of your imaging and laboratory studies on MyChart before your provider has had a chance to review them.  We understand that in some cases there may be results that are confusing or concerning to you. Not all laboratory results come back in the same time frame and the provider may be waiting for multiple results in order to interpret others.  Please give us 48 hours in order for your provider to thoroughly review all the results before contacting the office for clarification of your results.   The [facility] providers would like to encourage you to use MYCHART to communicate with providers for non-urgent requests or questions.  Due to long hold times on the telephone, sending your provider a message by MYCHART may be a faster and more efficient way to get a response.  Please allow 48 business hours for a response.  Please remember that this is for non-urgent requests.   Thank you for choosing me and [facility].  Malcolm T. Stark, Jr., MD., FACG  

## 2021-04-05 NOTE — Progress Notes (Signed)
History of Present Illness: This is a 70 yo male referred by Marin Olp, MD for the evaluation of iron deficiency anemia.  Several months ago he was found to be iron deficient.  He has GERD that is treated with omeprazole he has occasional breakthrough symptoms treated with Tums.  Otherwise he has no gastrointestinal complaints. FOBT was negative in March.  He is a regular blood donor and states he is given over 6 gallons.  He underwent screening colonoscopy in August 2014 which showed mild sigmoid colon diverticulosis, 1 small hyperplastic polyp and internal hemorrhoids. Denies weight loss, abdominal pain, constipation, diarrhea, change in stool caliber, melena, hematochezia, nausea, vomiting, dysphagia, chest pain.    No Known Allergies Outpatient Medications Prior to Visit  Medication Sig Dispense Refill   aspirin 81 MG tablet Take 81 mg by mouth daily.     latanoprost (XALATAN) 0.005 % ophthalmic solution Place 1 drop into both eyes at bedtime.     losartan (COZAAR) 100 MG tablet TAKE ONE TABLET BY MOUTH DAILY 90 tablet 3   meloxicam (MOBIC) 15 MG tablet Take 1 tablet by mouth daily. As needed     omeprazole (PRILOSEC) 20 MG capsule TAKE ONE CAPSULE BY MOUTH DAILY 90 capsule 3   rosuvastatin (CRESTOR) 5 MG tablet Take 1 tablet (5 mg total) by mouth once a week. 13 tablet 3   Sildenafil Citrate (VIAGRA PO) Take by mouth. As needed     traZODone (DESYREL) 50 MG tablet TAKE 1/2 TO 1 TABLET BY MOUTH EVERY NIGHT AT BEDTIME AS NEEDED FOR SLEEP 30 tablet 3   zolpidem (AMBIEN) 5 MG tablet Take 1 tablet (5 mg total) by mouth at bedtime as needed for sleep (do not drive for 8 hours after taking). 10 tablet 0   No facility-administered medications prior to visit.   Past Medical History:  Diagnosis Date   BCC (basal cell carcinoma of skin)    2008 or 2009 left shoulder   ED (erectile dysfunction)    Elevated liver function tests    2020 labs normal- issues in past    Family history of  bladder cancer    Family history of prostate cancer    Hypertension    Past Surgical History:  Procedure Laterality Date   WRIST GANGLION EXCISION     he reports several cysts in past   Social History   Socioeconomic History   Marital status: Divorced    Spouse name: Not on file   Number of children: Not on file   Years of education: Not on file   Highest education level: Not on file  Occupational History   Occupation: semi retired  Tobacco Use   Smoking status: Former    Packs/day: 0.20    Years: 2.00    Pack years: 0.40    Types: Cigarettes    Quit date: 05/02/1991    Years since quitting: 29.9   Smokeless tobacco: Never  Vaping Use   Vaping Use: Never used  Substance and Sexual Activity   Alcohol use: Yes    Alcohol/week: 2.0 standard drinks    Types: 2 Standard drinks or equivalent per week    Comment: beer last night   Drug use: No   Sexual activity: Yes  Other Topics Concern   Not on file  Social History Narrative   Divorced. Dating some. 3 children from prior marriage. 4 grandsons. No pets.       Self employeed Materials engineer   Social  Determinants of Health   Financial Resource Strain: Low Risk    Difficulty of Paying Living Expenses: Not hard at all  Food Insecurity: No Food Insecurity   Worried About [place] in the Last Year: Never true   Ran Out of Food in the Last Year: Never true  Transportation Needs: No Transportation Needs   Lack of Transportation (Medical): No   Lack of Transportation (Non-Medical): No  Physical Activity: Inactive   Days of Exercise per Week: 0 days   Minutes of Exercise per Session: 0 min  Stress: No Stress Concern Present   Feeling of Stress : Not at all  Social Connections: Moderately Integrated   Frequency of Communication with Friends and Family: More than three times a week   Frequency of Social Gatherings with Friends and Family: More than three times a week   Attends Religious Services: 1 to 4 times  per year   Active Member of [employer] or Organizations: Yes   Attends Archivist Meetings: 1 to 4 times per year   Marital Status: Divorced   Family History  Problem Relation Age of Onset   Coronary artery disease Brother        stent. patient 31 years older than patient.    Hyperlipidemia Brother    Stroke Mother        complications after a fall   Heart attack Father        age 68. smoker   Prostate cancer Father    Hypertension Brother    Cancer Brother        small cell lung cancer died at 61   Other Brother        airplane crash   Bladder Cancer Maternal Uncle        dx mid 66s   Prostate cancer Maternal Uncle    Heart defect Son        transposition of arteries       Review of Systems: Pertinent positive and negative review of systems were noted in the above HPI section. All other review of systems were otherwise negative.    Physical Exam: General: Well developed, well nourished, no acute distress Head: Normocephalic and atraumatic Eyes: Sclerae anicteric, EOMI Ears: Normal auditory acuity Mouth: Not examined, mask on during Covid-19 pandemic Neck: Supple, no masses or thyromegaly Lungs: Clear throughout to auscultation Heart: Regular rate and rhythm; no murmurs, rubs or bruits Abdomen: Soft, non tender and non distended. No masses, hepatosplenomegaly or hernias noted. Normal Bowel sounds Rectal: Deferred to colonoscopy  Musculoskeletal: Symmetrical with no gross deformities  Skin: No lesions on visible extremities Pulses:  Normal pulses noted Extremities: No clubbing, cyanosis, edema or deformities noted Neurological: Alert oriented x 4, grossly nonfocal Cervical Nodes:  No significant cervical adenopathy Inguinal Nodes: No significant inguinal adenopathy Psychological:  Alert and cooperative. Normal mood and affect   Assessment and Recommendations:  Iron deficiency anemia.  Rule out gastrointestinal sources.  Long-term blood donation is a likely  cause.  Schedule colonoscopy and EGD. The risks (including bleeding, perforation, infection, missed lesions, medication reactions and possible hospitalization or surgery if complications occur), benefits, and alternatives to colonoscopy with possible biopsy and possible polypectomy were discussed with the patient and they consent to proceed.  The risks (including bleeding, perforation, infection, missed lesions, medication reactions and possible hospitalization or surgery if complications occur), benefits, and alternatives to endoscopy with possible biopsy and possible dilation were discussed with the patient and they consent to proceed.  GERD.  Follow antireflux measures and continue omeprazole 20 mg daily.  Tums as needed.  Schedule EGD to evaluate for Barrett's, esophagitis and for a cause of iron deficiency.   cc: Marin Olp, [address] [place],  [place] [id number]

## 2021-04-18 ENCOUNTER — Telehealth: Payer: Self-pay | Admitting: Pharmacist

## 2021-04-18 NOTE — Chronic Care Management (AMB) (Signed)
° ° °  Chronic Care Management Pharmacy Assistant   Name: Dillon [name]  MRN: [id number] DOB: [birth year]   Reason for Encounter: General Adherence Call    Recent office visits:  02/01/2021 OV (PCP) Marin Olp, MD; no medication changes indicated.  Recent consult visits:  04/05/2021 OV Gertie Fey) Rudi Coco, MD; no medication changes indicated.   Hospital visits:  None in previous 6 months  Medications: Outpatient Encounter Medications as of 04/18/2021  Medication Sig Note   aspirin 81 MG tablet Take 81 mg by mouth daily.    latanoprost (XALATAN) 0.005 % ophthalmic solution Place 1 drop into both eyes at bedtime.    losartan (COZAAR) 100 MG tablet TAKE ONE TABLET BY MOUTH DAILY    meloxicam (MOBIC) 15 MG tablet Take 1 tablet by mouth daily. As needed    omeprazole (PRILOSEC) 20 MG capsule TAKE ONE CAPSULE BY MOUTH DAILY    rosuvastatin (CRESTOR) 5 MG tablet Take 1 tablet (5 mg total) by mouth once a week.    Sildenafil Citrate (VIAGRA PO) Take by mouth. As needed 08/10/2016: Prn    traZODone (DESYREL) 50 MG tablet TAKE 1/2 TO 1 TABLET BY MOUTH EVERY NIGHT AT BEDTIME AS NEEDED FOR SLEEP    zolpidem (AMBIEN) 5 MG tablet Take 1 tablet (5 mg total) by mouth at bedtime as needed for sleep (do not drive for 8 hours after taking).    No facility-administered encounter medications on file as of 04/18/2021.   Patient Questions: Have you had any problems recently with your health? Patient states he is scheduled to have an Endoscopy and Colonoscopy in February with Dr. Fuller Plan.  Have you had any problems with your pharmacy? He denies having any problems with his pharmacy.  What issues or side effects are you having with your medications? He denies having any issues or side effects with any of his medications.  What would you like me to pass along to Leata Mouse, CPP for him to help you with?  He does not have anything for me to pass along at this time.  What can we do to take  care of you better? Patient did not have any suggestions at this time.  Care Gaps: Medicare Annual Wellness: Completed 11/2020 Ophthalmology Exam: last completed 11/19/2020 Foot Exam: Overdue since 11/25/2020 Hemoglobin A1C: 6.4% on 03/18/2021 Colonoscopy: Next due on 12/26/2022  Future Appointments  Date Time Provider [place]  05/23/2021  8:00 AM Marin Olp, MD [facility]  06/01/2021 11:00 AM Ladene Artist, MD [facility]  06/08/2021  7:45 AM Lavonna Monarch, MD [facility]  12/01/2021  8:00 AM [facility] HEALTH COACH [facility]   Star Rating Drugs: Losartan 100 mg last filled 02/11/2021 90 DS  April D Calhoun, [place] Pharmacist Assistant [phone]

## 2021-05-16 NOTE — Progress Notes (Signed)
Phone [phone] In person visit   Subjective:   Dillon [name] is a 71 y.o. year old very pleasant male patient who presents for/with See problem oriented charting Chief Complaint  Patient presents with   Diabetes   Hypertension    This visit occurred during the SARS-CoV-2 public health emergency.  Safety protocols were in place, including screening questions prior to the visit, additional usage of staff PPE, and extensive cleaning of exam room while observing appropriate contact time as indicated for disinfecting solutions.   Past Medical History-  Patient Active Problem List   Diagnosis Date Noted   Diabetes mellitus without complication ([place]) 93/81/0175    Priority: High   Hyperlipidemia, unspecified 05/06/2018    Priority: Medium    GERD (gastroesophageal reflux disease) 05/06/2018    Priority: Medium    Former smoker 05/06/2018    Priority: Medium    OSA (obstructive sleep apnea) 11/30/2015    Priority: Medium    Elevated PSA 08/25/2015    Priority: Medium    Mild stage glaucoma(365.71) 02/12/2011    Priority: Medium    Venous tributary (branch) occlusion of retina 02/12/2011    Priority: Medium    Essential hypertension 05/16/2007    Priority: Medium    Insomnia 05/06/2018    Priority: Low   ERECTILE DYSFUNCTION 05/07/2008    Priority: Low   Genetic testing 02/02/2020   Family history of prostate cancer    Family history of bladder cancer    Family history of genetic disease 11/26/2019   Morbid obesity ([place]) 05/09/2016    Medications- reviewed and updated Current Outpatient Medications  Medication Sig Dispense Refill   aspirin 81 MG tablet Take 81 mg by mouth daily.     latanoprost (XALATAN) 0.005 % ophthalmic solution Place 1 drop into both eyes at bedtime.     losartan (COZAAR) 100 MG tablet TAKE ONE TABLET BY MOUTH DAILY 90 tablet 3   meloxicam (MOBIC) 15 MG tablet Take 1 tablet by mouth daily. As needed     omeprazole (PRILOSEC) 20 MG capsule TAKE  ONE CAPSULE BY MOUTH DAILY 90 capsule 3   rosuvastatin (CRESTOR) 5 MG tablet Take 1 tablet (5 mg total) by mouth once a week. 13 tablet 3   Sildenafil Citrate (VIAGRA PO) Take by mouth. As needed     traZODone (DESYREL) 50 MG tablet TAKE 1/2 TO 1 TABLET BY MOUTH EVERY NIGHT AT BEDTIME AS NEEDED FOR SLEEP 30 tablet 3   zolpidem (AMBIEN) 5 MG tablet Take 1 tablet (5 mg total) by mouth at bedtime as needed for sleep (do not drive for 8 hours after taking). 10 tablet 0   PLENVU 140 g SOLR Take by mouth. (Patient not taking: Reported on 05/23/2021)     No current facility-administered medications for this visit.     Objective:  BP 130/80 (BP Location: Left Arm, Patient Position: Sitting, Cuff Size: Large)    Pulse 60    Temp 97.8 F (36.6 C) (Temporal)    Ht 5\' 11"  (1.803 m)    Wt 260 lb 6.1 oz (118.1 kg)    SpO2 96%    BMI 36.32 kg/m  Gen: NAD, resting comfortably CV: RRR no murmurs rubs or gallops Lungs: CTAB no crackles, wheeze, rhonchi Abdomen: soft/nontender/nondistended.  No rebound or guarding.  Ext: no edema Skin: warm, dry    Assessment and Plan   # Headaches- recurred 3 weeks after covid at October visit. We thought he could be dealing with  dehydration. Encouraged pushing fluid intake and cutting down on coke/coffee. He declined er/crp at that time- today reports pushed fluids and cut down on caffeine. He states resolved and no further issues! Was able to continue caffeine   #hyperlipidemia S: Medication:Rosuvastatin 5 mg once a week, Aspirin 81 mg daily - starting to exercise more with walking- works from home and can walk neighborhood- 30 minutes about 3 days a week- just started -down 1 lb despite going through holidays Lab Results  Component Value Date   CHOL 152 10/18/2020   HDL 29.30 (L) 10/18/2020   LDLCALC 85 10/18/2020   LDLDIRECT 69.0 06/16/2020   TRIG 187.0 (H) 10/18/2020   CHOLHDL 5 10/18/2020   A/P:  LDL goal ideally under 70- has been just above goal- wants to  work on lifestyle   #hypertension S: medication: Losartan 100 mg daily with some cough - controlled with PPI better lately. No recent meloxicam- very sparing BP Readings from Last 3 Encounters:  05/23/21 130/80  04/05/21 110/80  02/01/21 122/82  A/P:  Controlled. Continue current medications.   # Insomnia S:medication: trazodone 50 mg as needed  (2-3 weeks since needing) and Ambien 5 mg as needed  much more sparingly (prefers to avoid). Does not use together A/P:reasonable control with sparing trazodone- very very rarely takes [place].    # Diabetes S: Medication: Diet controlled Exercise and diet- see hld section. Discussed trying to cut down on sweets Lab Results  Component Value Date   HGBA1C 6.4 03/18/2021   HGBA1C 6.9 (H) 10/18/2020   HGBA1C 6.6 (H) 06/16/2020   A/P: Too soon for A1c repeat but has been well controlled-continue work on diet/exercise  #Iron deficiency anemia-it was thought this may be related to donating blood.  Plan was for EGD and colonoscopy after last visit with Dr. Moody Bruins have not yet been completed and patient has follow early ferbruary for this with GI -thankfully no anemia noted on November labs and ferritin was improving -iron every other day right now- trying to stay hydrated to help with hydration  # Obesity- morbid with BMI over 35 and HTN, HLD, DM  S:only up 1 lb through holidays  Wt Readings from Last 3 Encounters:  05/23/21 260 lb 6.1 oz (118.1 kg)  04/05/21 258 lb (117 kg)  02/01/21 261 lb (118.4 kg)  A/P: slight improvement form last visit here- Encouraged need for healthy eating, regular exercise, weight loss.   #Health maintenance-Prevnar 20 today given   # dealing with some dental work issues- broken off tooth - sees Dr. Satira Sark this AM hopefully  Recommended follow up: Return in about 4 months (around 09/20/2021) for follow up- or sooner if needed. Future Appointments  Date Time Provider [place]  06/01/2021 11:00 AM Ladene Artist, MD [facility]  06/08/2021  7:45 AM Lavonna Monarch, MD [facility]  12/01/2021  8:00 AM [facility] HEALTH COACH [facility]   Lab/Order associations:   ICD-10-CM   1. Diabetes mellitus without complication (HCC)  Z36.6 CBC with Differential/Platelet    Comprehensive metabolic panel    2. Hyperlipidemia, unspecified hyperlipidemia type  E78.5 CBC with Differential/Platelet    Comprehensive metabolic panel    3. Essential hypertension  I10 CBC with Differential/Platelet    Comprehensive metabolic panel    4. Acute nonintractable headache, unspecified headache type  R51.9     5. Need for prophylactic vaccination against Streptococcus pneumoniae (pneumococcus)  Z23 Pneumococcal conjugate vaccine 20-valent    6. Anemia, unspecified type  D64.9 IBC + Ferritin      No orders of the defined types were placed in this encounter.   I,Jada Bradford,acting as a scribe for Garret Reddish, MD.,have documented all relevant documentation on the behalf of Garret Reddish, MD,as directed by  Garret Reddish, MD while in the presence of Garret Reddish, MD.  I, Garret Reddish, MD, have reviewed all documentation for this visit. The documentation on 05/23/21 for the exam, diagnosis, procedures, and orders are all accurate and complete.  Return precautions advised.  Garret Reddish, MD

## 2021-05-23 ENCOUNTER — Ambulatory Visit (INDEPENDENT_AMBULATORY_CARE_PROVIDER_SITE_OTHER): Payer: Medicare Other | Admitting: Family Medicine

## 2021-05-23 ENCOUNTER — Other Ambulatory Visit: Payer: Self-pay

## 2021-05-23 ENCOUNTER — Encounter: Payer: Self-pay | Admitting: Family Medicine

## 2021-05-23 ENCOUNTER — Ambulatory Visit: Payer: Medicare Other | Admitting: Dermatology

## 2021-05-23 VITALS — BP 130/80 | HR 60 | Temp 97.8°F | Ht 71.0 in | Wt 260.4 lb

## 2021-05-23 DIAGNOSIS — I10 Essential (primary) hypertension: Secondary | ICD-10-CM

## 2021-05-23 DIAGNOSIS — Z23 Encounter for immunization: Secondary | ICD-10-CM | POA: Diagnosis not present

## 2021-05-23 DIAGNOSIS — E119 Type 2 diabetes mellitus without complications: Secondary | ICD-10-CM | POA: Diagnosis not present

## 2021-05-23 DIAGNOSIS — R519 Headache, unspecified: Secondary | ICD-10-CM

## 2021-05-23 DIAGNOSIS — E785 Hyperlipidemia, unspecified: Secondary | ICD-10-CM

## 2021-05-23 DIAGNOSIS — D649 Anemia, unspecified: Secondary | ICD-10-CM | POA: Diagnosis not present

## 2021-05-23 NOTE — Patient Instructions (Addendum)
Schedule labs at the desk for Wednesday or Thursday before you leave today. No labs today  Glad you are doing well! No changes today   Recommended follow up: Return in about 4 months (around 09/20/2021) for follow up- or sooner if needed. If you want to do this at 5 months you can and then you will be due for full lipid panel

## 2021-05-25 ENCOUNTER — Other Ambulatory Visit: Payer: Self-pay

## 2021-05-25 ENCOUNTER — Other Ambulatory Visit (INDEPENDENT_AMBULATORY_CARE_PROVIDER_SITE_OTHER): Payer: Medicare Other

## 2021-05-25 DIAGNOSIS — E785 Hyperlipidemia, unspecified: Secondary | ICD-10-CM | POA: Diagnosis not present

## 2021-05-25 DIAGNOSIS — I10 Essential (primary) hypertension: Secondary | ICD-10-CM | POA: Diagnosis not present

## 2021-05-25 DIAGNOSIS — D649 Anemia, unspecified: Secondary | ICD-10-CM | POA: Diagnosis not present

## 2021-05-25 DIAGNOSIS — E119 Type 2 diabetes mellitus without complications: Secondary | ICD-10-CM

## 2021-05-25 LAB — IBC + FERRITIN
Ferritin: 82 ng/mL (ref 22.0–322.0)
Iron: 163 ug/dL (ref 42–165)
Saturation Ratios: 37.6 % (ref 20.0–50.0)
TIBC: 434 ug/dL (ref 250.0–450.0)
Transferrin: 310 mg/dL (ref 212.0–360.0)

## 2021-05-25 LAB — COMPREHENSIVE METABOLIC PANEL
ALT: 31 U/L (ref 0–53)
AST: 23 U/L (ref 0–37)
Albumin: 4.5 g/dL (ref 3.5–5.2)
Alkaline Phosphatase: 64 U/L (ref 39–117)
BUN: 18 mg/dL (ref 6–23)
CO2: 28 mEq/L (ref 19–32)
Calcium: 10.1 mg/dL (ref 8.4–10.5)
Chloride: 98 mEq/L (ref 96–112)
Creatinine, Ser: 1.17 mg/dL (ref 0.40–1.50)
GFR: 63.82 mL/min (ref >60.00)
Glucose, Bld: 120 mg/dL — ABNORMAL HIGH (ref 70–99)
Potassium: 4.2 mEq/L (ref 3.5–5.1)
Sodium: 134 mEq/L — ABNORMAL LOW (ref 135–145)
Total Bilirubin: 1.3 mg/dL — ABNORMAL HIGH (ref 0.2–1.2)
Total Protein: 7.3 g/dL (ref 6.0–8.3)

## 2021-05-25 LAB — CBC WITH DIFFERENTIAL/PLATELET
Basophils Absolute: 0.1 10*3/uL (ref 0.0–0.1)
Basophils Relative: 1 % (ref 0.0–3.0)
Eosinophils Absolute: 0.3 10*3/uL (ref 0.0–0.7)
Eosinophils Relative: 4.3 % (ref 0.0–5.0)
HCT: 46.8 % (ref 39.0–52.0)
Hemoglobin: 16.2 g/dL (ref 13.0–17.0)
Lymphocytes Relative: 39.8 % (ref 12.0–46.0)
Lymphs Abs: 2.5 10*3/uL (ref 0.7–4.0)
MCHC: 34.6 g/dL (ref 30.0–36.0)
MCV: 95.6 fl (ref 78.0–100.0)
Monocytes Absolute: 0.8 10*3/uL (ref 0.1–1.0)
Monocytes Relative: 13.6 % — ABNORMAL HIGH (ref 3.0–12.0)
Neutro Abs: 2.6 10*3/uL (ref 1.4–7.7)
Neutrophils Relative %: 41.3 % — ABNORMAL LOW (ref 43.0–77.0)
Platelets: 133 10*3/uL — ABNORMAL LOW (ref 150.0–400.0)
RBC: 4.89 Mil/uL (ref 4.22–5.81)
RDW: 13.1 % (ref 11.5–15.5)
WBC: 6.2 10*3/uL (ref 4.0–10.5)

## 2021-06-01 ENCOUNTER — Other Ambulatory Visit: Payer: Self-pay

## 2021-06-01 ENCOUNTER — Ambulatory Visit (AMBULATORY_SURGERY_CENTER): Payer: Medicare Other | Admitting: Gastroenterology

## 2021-06-01 ENCOUNTER — Encounter: Payer: Self-pay | Admitting: Gastroenterology

## 2021-06-01 VITALS — BP 122/82 | HR 79 | Temp 97.8°F | Resp 13 | Ht 71.0 in | Wt 258.0 lb

## 2021-06-01 DIAGNOSIS — K640 First degree hemorrhoids: Secondary | ICD-10-CM | POA: Diagnosis not present

## 2021-06-01 DIAGNOSIS — D509 Iron deficiency anemia, unspecified: Secondary | ICD-10-CM

## 2021-06-01 DIAGNOSIS — K2970 Gastritis, unspecified, without bleeding: Secondary | ICD-10-CM | POA: Diagnosis not present

## 2021-06-01 DIAGNOSIS — K219 Gastro-esophageal reflux disease without esophagitis: Secondary | ICD-10-CM

## 2021-06-01 DIAGNOSIS — K5730 Diverticulosis of large intestine without perforation or abscess without bleeding: Secondary | ICD-10-CM

## 2021-06-01 DIAGNOSIS — D122 Benign neoplasm of ascending colon: Secondary | ICD-10-CM | POA: Diagnosis not present

## 2021-06-01 DIAGNOSIS — K2950 Unspecified chronic gastritis without bleeding: Secondary | ICD-10-CM | POA: Diagnosis not present

## 2021-06-01 MED ORDER — SODIUM CHLORIDE 0.9 % IV SOLN
500.0000 mL | Freq: Once | INTRAVENOUS | Status: DC
Start: 2021-06-01 — End: 2021-06-01

## 2021-06-01 NOTE — Progress Notes (Signed)
Called to room to assist during endoscopic procedure.  Patient ID and intended procedure confirmed with present staff. Received instructions for my participation in the procedure from the performing physician.  

## 2021-06-01 NOTE — Op Note (Signed)
[facility] Patient Name: Dillon [name] Procedure Date: 06/01/2021 11:12 AM MRN: [id number] Endoscopist: Ladene Artist , MD Age: 71 Referring MD:  Date of Birth: 18-Aug-1950 Gender: Male Account #: [id number] Procedure:                Colonoscopy Indications:              Unexplained iron deficiency anemia Medicines:                Monitored Anesthesia Care Procedure:                Pre-Anesthesia Assessment:                           - Prior to the procedure, a History and Physical                            was performed, and patient medications and                            allergies were reviewed. The patient's tolerance of                            previous anesthesia was also reviewed. The risks                            and benefits of the procedure and the sedation                            options and risks were discussed with the patient.                            All questions were answered, and informed consent                            was obtained. Prior Anticoagulants: The patient has                            taken no previous anticoagulant or antiplatelet                            agents. ASA Grade Assessment: III - A patient with                            severe systemic disease. After reviewing the risks                            and benefits, the patient was deemed in                            satisfactory condition to undergo the procedure.                           After obtaining informed consent, the colonoscope  was passed under direct vision. Throughout the                            procedure, the patient's blood pressure, pulse, and                            oxygen saturations were monitored continuously. The                            Olympus CF-HQ190L [id number]) Colonoscope was                            introduced through the anus and advanced to the the                            cecum, identified by  appendiceal orifice and                            ileocecal valve. The ileocecal valve, appendiceal                            orifice, and rectum were photographed. The quality                            of the bowel preparation was good. The colonoscopy                            was performed without difficulty. The patient                            tolerated the procedure well. Scope In: 11:20:53 AM Scope Out: 11:32:42 AM Scope Withdrawal Time: 0 hours 10 minutes 2 seconds  Total Procedure Duration: 0 hours 11 minutes 49 seconds  Findings:                 The perianal and digital rectal examinations were                            normal.                           A 6 mm polyp was found in the ascending colon. The                            polyp was sessile. The polyp was removed with a                            cold snare. Resection and retrieval were complete.                           A few small-mouthed diverticula were found in the                            ascending colon. There was no evidence of  diverticular bleeding.                           Multiple medium-mouthed diverticula were found in                            the left colon. There was no evidence of                            diverticular bleeding.                           Internal hemorrhoids were found during                            retroflexion. The hemorrhoids were moderate and                            Grade I (internal hemorrhoids that do not prolapse).                           The exam was otherwise without abnormality on                            direct and retroflexion views. Complications:            No immediate complications. Estimated blood loss:                            None. Estimated Blood Loss:     Estimated blood loss: none. Impression:               - One 6 mm polyp in the ascending colon, removed                            with a cold snare. Resected and  retrieved.                           - Mild diverticulosis in the ascending colon.                           - Moderate diverticulosis in the left colon.                           - Internal hemorrhoids.                           - The examination was otherwise normal on direct                            and retroflexion views. Recommendation:           - Repeat colonoscopy after studies are complete for                            surveillance based on pathology results.                           -  Patient has a contact number available for                            emergencies. The signs and symptoms of potential                            delayed complications were discussed with the                            patient. Return to normal activities tomorrow.                            Written discharge instructions were provided to the                            patient.                           - High fiber diet.                           - Continue present medications.                           - Await pathology results. Ladene Artist, MD 06/01/2021 11:44:15 AM This report has been signed electronically.

## 2021-06-01 NOTE — Progress Notes (Signed)
VSS, transported to PACU °

## 2021-06-01 NOTE — Patient Instructions (Signed)
Information on polyps, diverticulosis and hemorrhoids given to you today.  Await pathology results.  Resume previous diet and medications.    YOU HAD AN ENDOSCOPIC PROCEDURE TODAY AT [facility]:   Refer to the procedure report that was given to you for any specific questions about what was found during the examination.  If the procedure report does not answer your questions, please call your gastroenterologist to clarify.  If you requested that your care partner not be given the details of your procedure findings, then the procedure report has been included in a sealed envelope for you to review at your convenience later.  YOU SHOULD EXPECT: Some feelings of bloating in the abdomen. Passage of more gas than usual.  Walking can help get rid of the air that was put into your GI tract during the procedure and reduce the bloating. If you had a lower endoscopy (such as a colonoscopy or flexible sigmoidoscopy) you may notice spotting of blood in your stool or on the toilet paper. If you underwent a bowel prep for your procedure, you may not have a normal bowel movement for a few days.  Please Note:  You might notice some irritation and congestion in your nose or some drainage.  This is from the oxygen used during your procedure.  There is no need for concern and it should clear up in a day or so.  SYMPTOMS TO REPORT IMMEDIATELY:  Following lower endoscopy (colonoscopy or flexible sigmoidoscopy):  Excessive amounts of blood in the stool  Significant tenderness or worsening of abdominal pains  Swelling of the abdomen that is new, acute  Fever of 100F or higher  Following upper endoscopy (EGD)  Vomiting of blood or coffee ground material  New chest pain or pain under the shoulder blades  Painful or persistently difficult swallowing  New shortness of breath  Fever of 100F or higher  Black, tarry-looking stools  For urgent or emergent issues, a gastroenterologist can be reached  at any hour by calling [phone]. Do not use MyChart messaging for urgent concerns.    DIET:  We do recommend a small meal at first, but then you may proceed to your regular diet.  Drink plenty of fluids but you should avoid alcoholic beverages for 24 hours.  ACTIVITY:  You should plan to take it easy for the rest of today and you should NOT DRIVE or use heavy machinery until tomorrow (because of the sedation medicines used during the test).    FOLLOW UP: Our staff will call the number listed on your records 48-72 hours following your procedure to check on you and address any questions or concerns that you may have regarding the information given to you following your procedure. If we do not reach you, we will leave a message.  We will attempt to reach you two times.  During this call, we will ask if you have developed any symptoms of COVID 19. If you develop any symptoms (ie: fever, flu-like symptoms, shortness of breath, cough etc.) before then, please call [phone].  If you test positive for Covid 19 in the 2 weeks post procedure, please call and report this information to [place].    If any biopsies were taken you will be contacted by phone or by letter within the next 1-3 weeks.  Please call us at [phone] if you have not heard about the biopsies in 3 weeks.    SIGNATURES/CONFIDENTIALITY: You and/or your care partner have signed paperwork which  will be entered into your electronic medical record.  These signatures attest to the fact that that the information above on your After Visit Summary has been reviewed and is understood.  Full responsibility of the confidentiality of this discharge information lies with you and/or your care-partner.

## 2021-06-01 NOTE — Op Note (Signed)
[facility] Patient Name: Dillon [name] Procedure Date: 06/01/2021 11:12 AM MRN: [id number] Endoscopist: Ladene Artist , MD Age: 71 Referring MD:  Date of Birth: [birth year] Gender: Male Account #: [id number] Procedure:                Upper GI endoscopy Indications:              Unexplained iron deficiency anemia,                            Gastroesophageal reflux disease Medicines:                Monitored Anesthesia Care Procedure:                Pre-Anesthesia Assessment:                           - Prior to the procedure, a History and Physical                            was performed, and patient medications and                            allergies were reviewed. The patient's tolerance of                            previous anesthesia was also reviewed. The risks                            and benefits of the procedure and the sedation                            options and risks were discussed with the patient.                            All questions were answered, and informed consent                            was obtained. Prior Anticoagulants: The patient has                            taken no previous anticoagulant or antiplatelet                            agents. ASA Grade Assessment: III - A patient with                            severe systemic disease. After reviewing the risks                            and benefits, the patient was deemed in                            satisfactory condition to undergo the procedure.  After obtaining informed consent, the endoscope was                            passed under direct vision. Throughout the                            procedure, the patient's blood pressure, pulse, and                            oxygen saturations were monitored continuously. The                            GIF [id number] was introduced through the                            mouth, and advanced to the second part  of duodenum.                            The upper GI endoscopy was accomplished without                            difficulty. The patient tolerated the procedure                            well. Scope In: Scope Out: Findings:                 The examined esophagus was normal.                           A medium-sized hiatal hernia was present.                           Patchy mildly erythematous mucosa without bleeding                            was found in the gastric body. Biopsies were taken                            with a cold forceps for histology.                           The exam of the stomach was otherwise normal.                           The duodenal bulb and second portion of the                            duodenum were normal. Biopsies for histology were                            taken with a cold forceps for evaluation of celiac                            disease. Complications:  No immediate complications. Estimated Blood Loss:     Estimated blood loss was minimal. Impression:               - Normal esophagus.                           - Medium-sized hiatal hernia.                           - Erythematous mucosa in the gastric body. Biopsied.                           - Normal duodenal bulb and second portion of the                            duodenum. Biopsied. Recommendation:           - Patient has a contact number available for                            emergencies. The signs and symptoms of potential                            delayed complications were discussed with the                            patient. Return to normal activities tomorrow.                            Written discharge instructions were provided to the                            patient.                           - Resume previous diet.                           - Follow antireflux measures.                           - Continue present medications.                           -  Await pathology results. Ladene Artist, MD 06/01/2021 11:47:04 AM This report has been signed electronically.

## 2021-06-01 NOTE — Progress Notes (Signed)
See 05/23/2021 H&P, no changes.

## 2021-06-03 ENCOUNTER — Telehealth: Payer: Self-pay | Admitting: *Deleted

## 2021-06-03 NOTE — Telephone Encounter (Signed)
°  Follow up Call-  Call back number 06/01/2021  Post procedure Call Back phone  # [phone]  Permission to leave phone message Yes  Some recent data might be hidden     Patient questions:  Do you have a fever, pain , or abdominal swelling? No. Pain Score  0 *  Have you tolerated food without any problems? Yes.    Have you been able to return to your normal activities? Yes.    Do you have any questions about your discharge instructions: Diet   No. Medications  No. Follow up visit  No.  Do you have questions or concerns about your Care? No.  Actions: * If pain score is 4 or above: No action needed, pain <4.

## 2021-06-08 ENCOUNTER — Encounter: Payer: Self-pay | Admitting: Dermatology

## 2021-06-08 ENCOUNTER — Ambulatory Visit (INDEPENDENT_AMBULATORY_CARE_PROVIDER_SITE_OTHER): Payer: Medicare Other | Admitting: Dermatology

## 2021-06-08 ENCOUNTER — Other Ambulatory Visit: Payer: Self-pay

## 2021-06-08 DIAGNOSIS — Z85828 Personal history of other malignant neoplasm of skin: Secondary | ICD-10-CM | POA: Diagnosis not present

## 2021-06-08 DIAGNOSIS — Z1283 Encounter for screening for malignant neoplasm of skin: Secondary | ICD-10-CM

## 2021-06-08 DIAGNOSIS — L281 Prurigo nodularis: Secondary | ICD-10-CM

## 2021-06-08 DIAGNOSIS — L821 Other seborrheic keratosis: Secondary | ICD-10-CM

## 2021-06-08 DIAGNOSIS — L918 Other hypertrophic disorders of the skin: Secondary | ICD-10-CM

## 2021-06-15 ENCOUNTER — Encounter: Payer: Self-pay | Admitting: Gastroenterology

## 2021-06-26 ENCOUNTER — Encounter: Payer: Self-pay | Admitting: Dermatology

## 2021-06-26 NOTE — Progress Notes (Signed)
° °  Follow-Up Visit   Subjective  Dillon [name] is a 71 y.o. male who presents for the following: Annual Exam (No concerns. Personal history of bcc. ).  General skin examination, several areas to check Location:  Duration:  Quality:  Associated Signs/Symptoms: Modifying Factors:  Severity:  Timing: Context:   Objective  Well appearing patient in no apparent distress; mood and affect are within normal limits. Scalp Skin examination: No atypical pigmented lesions or nonmelanoma skin cancer  Left Lower Back (2), Right Dorsal Hand Brown flattopped textured 4 to 8 mm papules, typical dermoscopy  Mid Parietal Scalp Lichenified 5 mm white papule, patient aware that he picks  Left Upper Eyelid, Right Axilla, Right Upper Eyelid Pedunculated flesh-colored 44mm papules    All skin waist up examined.   Assessment & Plan    Encounter for screening for malignant neoplasm of skin Scalp  Annual skin examination, encouraged to self examine twice annually.  Continued ultraviolet protection.  Seborrheic keratosis (3) Left Lower Back (2); Right Dorsal Hand  Leave if stable  Prurigo nodularis Mid Parietal Scalp  No intervention initiated but if there is growth or bleeding return for biopsy  Skin tag (3) Right Axilla; Left Upper Eyelid; Right Upper Eyelid      I, Lavonna Monarch, MD, have reviewed all documentation for this visit.  The documentation on 06/26/21 for the exam, diagnosis, procedures, and orders are all accurate and complete.

## 2021-08-07 ENCOUNTER — Other Ambulatory Visit: Payer: Self-pay | Admitting: Family Medicine

## 2021-08-24 ENCOUNTER — Encounter: Payer: Self-pay | Admitting: Family Medicine

## 2021-08-25 ENCOUNTER — Encounter: Payer: Self-pay | Admitting: Family Medicine

## 2021-08-25 ENCOUNTER — Ambulatory Visit (INDEPENDENT_AMBULATORY_CARE_PROVIDER_SITE_OTHER): Payer: Medicare Other | Admitting: Family Medicine

## 2021-08-25 VITALS — BP 108/64 | HR 95 | Temp 98.0°F | Ht 71.0 in | Wt 255.6 lb

## 2021-08-25 DIAGNOSIS — E785 Hyperlipidemia, unspecified: Secondary | ICD-10-CM

## 2021-08-25 DIAGNOSIS — F439 Reaction to severe stress, unspecified: Secondary | ICD-10-CM | POA: Diagnosis not present

## 2021-08-25 DIAGNOSIS — G4700 Insomnia, unspecified: Secondary | ICD-10-CM

## 2021-08-25 DIAGNOSIS — E119 Type 2 diabetes mellitus without complications: Secondary | ICD-10-CM

## 2021-08-25 DIAGNOSIS — I10 Essential (primary) hypertension: Secondary | ICD-10-CM

## 2021-08-25 LAB — CBC WITH DIFFERENTIAL/PLATELET
Basophils Absolute: 0.1 10*3/uL (ref 0.0–0.1)
Basophils Relative: 1.5 % (ref 0.0–3.0)
Eosinophils Absolute: 0.2 10*3/uL (ref 0.0–0.7)
Eosinophils Relative: 4.1 % (ref 0.0–5.0)
HCT: 45.1 % (ref 39.0–52.0)
Hemoglobin: 16 g/dL (ref 13.0–17.0)
Lymphocytes Relative: 32.7 % (ref 12.0–46.0)
Lymphs Abs: 1.6 10*3/uL (ref 0.7–4.0)
MCHC: 35.5 g/dL (ref 30.0–36.0)
MCV: 95 fl (ref 78.0–100.0)
Monocytes Absolute: 0.6 10*3/uL (ref 0.1–1.0)
Monocytes Relative: 11.6 % (ref 3.0–12.0)
Neutro Abs: 2.5 10*3/uL (ref 1.4–7.7)
Neutrophils Relative %: 50.1 % (ref 43.0–77.0)
Platelets: 151 10*3/uL (ref 150.0–400.0)
RBC: 4.74 Mil/uL (ref 4.22–5.81)
RDW: 13.2 % (ref 11.5–15.5)
WBC: 5 10*3/uL (ref 4.0–10.5)

## 2021-08-25 LAB — COMPREHENSIVE METABOLIC PANEL
ALT: 50 U/L (ref 0–53)
AST: 42 U/L — ABNORMAL HIGH (ref 0–37)
Albumin: 4.4 g/dL (ref 3.5–5.2)
Alkaline Phosphatase: 63 U/L (ref 39–117)
BUN: 20 mg/dL (ref 6–23)
CO2: 23 mEq/L (ref 19–32)
Calcium: 9.9 mg/dL (ref 8.4–10.5)
Chloride: 99 mEq/L (ref 96–112)
Creatinine, Ser: 1.17 mg/dL (ref 0.40–1.50)
GFR: 63.71 mL/min (ref >60.00)
Glucose, Bld: 121 mg/dL — ABNORMAL HIGH (ref 70–99)
Potassium: 3.8 mEq/L (ref 3.5–5.1)
Sodium: 130 mEq/L — ABNORMAL LOW (ref 135–145)
Total Bilirubin: 1 mg/dL (ref 0.2–1.2)
Total Protein: 7.2 g/dL (ref 6.0–8.3)

## 2021-08-25 LAB — TSH: TSH: 2.08 u[IU]/mL (ref 0.35–5.50)

## 2021-08-25 LAB — HEMOGLOBIN A1C: Hgb A1c MFr Bld: 6.7 % — ABNORMAL HIGH (ref 4.6–6.5)

## 2021-08-25 MED ORDER — TRAZODONE HCL 50 MG PO TABS: ORAL_TABLET | ORAL | 3 refills | Status: DC

## 2021-08-25 MED ORDER — ZOLPIDEM TARTRATE 5 MG PO TABS: 5.0000 mg | ORAL_TABLET | Freq: Every evening | ORAL | 1 refills | Status: DC | PRN

## 2021-08-25 NOTE — Progress Notes (Signed)
?Phone [phone] ?In person visit ?  ?Subjective:  ? ?Dillon [name] is a 71 y.o. year old very pleasant male patient who presents for/with See problem oriented charting ?Chief Complaint  ?Patient presents with  ? Insomnia  ?  Pt c/o sleep issues that started a month ago with falling asleep and staying asleep. He tried trazadone and CBD which helped the first night but did not help long term. He is only taking half a tablet of ambien.  ? ? ?This visit occurred during the SARS-CoV-2 public health emergency.  Safety protocols were in place, including screening questions prior to the visit, additional usage of staff PPE, and extensive cleaning of exam room while observing appropriate contact time as indicated for disinfecting solutions.  ? ?Past Medical History-  ?Patient Active Problem List  ? Diagnosis Date Noted  ? Diabetes mellitus without complication ([place]) 84/69/6295  ?  Priority: High  ? Hyperlipidemia, unspecified 05/06/2018  ?  Priority: Medium   ? GERD (gastroesophageal reflux disease) 05/06/2018  ?  Priority: Medium   ? Former smoker 05/06/2018  ?  Priority: Medium   ? OSA (obstructive sleep apnea) 11/30/2015  ?  Priority: Medium   ? Elevated PSA 08/25/2015  ?  Priority: Medium   ? Mild stage glaucoma(365.71) 02/12/2011  ?  Priority: Medium   ? Venous tributary (branch) occlusion of retina 02/12/2011  ?  Priority: Medium   ? Essential hypertension 05/16/2007  ?  Priority: Medium   ? Insomnia 05/06/2018  ?  Priority: Low  ? ERECTILE DYSFUNCTION 05/07/2008  ?  Priority: Low  ? Genetic testing 02/02/2020  ? Family history of prostate cancer   ? Family history of bladder cancer   ? Family history of genetic disease 11/26/2019  ? Morbid obesity ([place]) 05/09/2016  ? ? ?Medications- reviewed and updated ?Current Outpatient Medications  ?Medication Sig Dispense Refill  ? aspirin 81 MG tablet Take 81 mg by mouth daily.    ? latanoprost (XALATAN) 0.005 % ophthalmic solution Place 1 drop into both eyes at bedtime.     ? losartan (COZAAR) 100 MG tablet TAKE ONE TABLET BY MOUTH DAILY 90 tablet 3  ? meloxicam (MOBIC) 15 MG tablet Take 1 tablet by mouth daily. As needed    ? omeprazole (PRILOSEC) 20 MG capsule TAKE ONE CAPSULE BY MOUTH DAILY 90 capsule 3  ? rosuvastatin (CRESTOR) 5 MG tablet Take 1 tablet (5 mg total) by mouth once a week. 13 tablet 3  ? Sildenafil Citrate (VIAGRA PO) Take by mouth. As needed    ? traZODone (DESYREL) 50 MG tablet TAKE 1/2 TO 1 TABLET BY MOUTH EVERY NIGHT AT BEDTIME AS NEEDED FOR SLEEP 30 tablet 3  ? zolpidem (AMBIEN) 5 MG tablet Take 1 tablet (5 mg total) by mouth at bedtime as needed for sleep (do not drive for 8 hours after taking). 15 tablet 1  ? ?No current facility-administered medications for this visit.  ? ?  ?Objective:  ?BP 108/64   Pulse 95   Temp 98 ?F (36.7 ?C)   Ht '5\' 11"'$  (1.803 m)   Wt 255 lb 9.6 oz (115.9 kg)   SpO2 96%   BMI 35.65 kg/m?  ?Gen: NAD, resting comfortably ?CV: RRR no murmurs rubs or gallops ?Lungs: CTAB no crackles, wheeze, rhonchi ?Ext: no edema ?Skin: warm, dry ? ?Diabetic Foot Exam - Simple   ?Simple Foot Form ?Diabetic Foot exam was performed with the following findings: Yes 08/25/2021  8:22 AM  ?Visual Inspection ?  No deformities, no ulcerations, no other skin breakdown bilaterally: Yes ?Sensation Testing ?Intact to touch and monofilament testing bilaterally: Yes ?Pulse Check ?Posterior Tibialis and Dorsalis pulse intact bilaterally: Yes ?Comments ?  ? ? ?  ? ?Assessment and Plan  ? ?# Insomnia ?S:from intake " ? Insomnia  ?  Pt c/o sleep issues that started a month ago with falling asleep and staying asleep. He tried trazadone and CBD which helped the first night but did not help long term. He is only taking half a tablet of ambien.  ?"  ?Last visit reported sparing trazodone -had been 2-3 weeks since use and typically avoiding ambien- never used together.  ? ?Today reports stress has gone up- work has been very stressful- he thinks this may be his last year.  Tuesday night tried an old ativan- slept from 9 PM  to 5: 15 - best night of sleep in a month- also was having som elow back pain and felt much better after dose. Only taking half trazodone or half ambien right now.  ? ?More fatigue but thought related to poor sleep. Libido fine. Only 2-3 hours sleep except for the other night when he took ativan- half tablet.  ?A/P: poor control- Try full trazodone and if effective continue. If not effective can take full ambien- I gave larger rx to get through more stressful period- perhaps use 2-3 nights a week if needed as long as not driving for 8 hours after use.   ? ?# Diabetes ?S: Medication:none, diet controlled - down 5 lbs from last visit. Walking more in neighborhood ?Lab Results  ?Component Value Date  ? HGBA1C 6.4 03/18/2021  ? HGBA1C 6.9 (H) 10/18/2020  ? HGBA1C 6.6 (H) 06/16/2020  ?A/P: has been well controlled- continue current medicine ? ?#hypertension ?S: medication: losartan '100mg'$  ?BP Readings from Last 3 Encounters:  ?08/25/21 108/64  ?06/01/21 122/82  ?05/23/21 130/80  ?A/P: Controlled. Continue current medications.  ? ?#some cramping worse at night does not line up with taking rosuvastatin once a week ? ?Recommended follow up: Return in about 6 months (around 02/24/2022) for followup or sooner if needed.Schedule b4 you leave. Can cancel June visit ?Future Appointments  ?Date Time Provider [place]  ?10/10/2021  9:00 AM Marin Olp, MD [facility]  ?12/01/2021  8:00 AM [facility] HEALTH COACH [facility]  ?06/12/2022  7:45 AM Lavonna Monarch, MD [facility]  ? ? ?Lab/Order associations: ?  ICD-10-CM   ?1. Insomnia, unspecified type  G47.00   ?  ?2. Stress  F43.9   ?  ?3. Diabetes mellitus without complication (HCC)  X21.1 CBC with Differential/Platelet  ?  Comprehensive metabolic panel  ?  Hemoglobin A1c  ?  ?4. Hyperlipidemia, unspecified hyperlipidemia type  E78.5 CBC with Differential/Platelet  ?  Comprehensive metabolic panel  ?  TSH  ?  ?5.  Essential hypertension  I10 CBC with Differential/Platelet  ?  Comprehensive metabolic panel  ?  ? ? ?Meds ordered this encounter  ?Medications  ? zolpidem (AMBIEN) 5 MG tablet  ?  Sig: Take 1 tablet (5 mg total) by mouth at bedtime as needed for sleep (do not drive for 8 hours after taking).  ?  Dispense:  15 tablet  ?  Refill:  1  ? traZODone (DESYREL) 50 MG tablet  ?  Sig: TAKE 1/2 TO 1 TABLET BY MOUTH EVERY NIGHT AT BEDTIME AS NEEDED FOR SLEEP  ?  Dispense:  30 tablet  ?  Refill:  3  ? ? ?  Return precautions advised.  ?Garret Reddish, MD ? ?

## 2021-08-25 NOTE — Patient Instructions (Addendum)
Let us know when you have gotten your next COVID and [facility] vaccine at your pharmacy. ? ?Try full trazodone and if effective continue. If not effective can take full ambien- I gave larger rx to get through more stressful period- perhaps use 2-3 nights a week if needed as long as not driving for 8 hours after use.  ? ?Please stop by lab before you go ?If you have mychart- we will send your results within 3 business days of [place] receiving them.  ?If you do not have mychart- we will call you about results within 5 business days of [place] receiving them.  ?*please also note that you will see labs on mychart as soon as they post. I will later go in and write notes on them- will say "notes from Dr. Yong Channel"  ? ?Recommended follow up: Return in about 6 months (around 02/24/2022) for followup or sooner if needed.Schedule b4 you leave.  ?- cancel June visit before you leave ?

## 2021-08-29 ENCOUNTER — Ambulatory Visit: Payer: Medicare Other | Admitting: Family Medicine

## 2021-10-10 ENCOUNTER — Ambulatory Visit: Payer: Medicare Other | Admitting: Family Medicine

## 2021-11-25 ENCOUNTER — Ambulatory Visit (INDEPENDENT_AMBULATORY_CARE_PROVIDER_SITE_OTHER): Payer: Medicare Other

## 2021-11-25 DIAGNOSIS — Z0000 Encounter for general adult medical examination without abnormal findings: Secondary | ICD-10-CM | POA: Diagnosis not present

## 2021-11-25 NOTE — Progress Notes (Signed)
Virtual Visit via Telephone Note  I connected with  Dillon [name] on 11/25/21 at  8:00 AM EDT by telephone and verified that I am speaking with the correct person using two identifiers.  Medicare Annual Wellness visit completed telephonically due to Covid-19 pandemic.   Persons participating in this call: This Health Coach and this patient.   Location: Patient: Home Provider: office   I discussed the limitations, risks, security and privacy concerns of performing an evaluation and management service by telephone and the availability of in person appointments. The patient expressed understanding and agreed to proceed.  Unable to perform video visit due to video visit attempted and failed and/or patient does not have video capability.   Some vital signs may be absent or patient reported.   [name], LPN   Subjective:   Dillon [name] is a 71 y.o. male who presents for Medicare Annual/Subsequent preventive examination.  Review of Systems     Cardiac Risk Factors include: advanced age (>84mn, >>75women);diabetes mellitus;hypertension;dyslipidemia;male gender;obesity (BMI >30kg/m2)     Objective:    There were no vitals filed for this visit. There is no height or weight on file to calculate BMI.     11/25/2021    8:06 AM 12/23/2020    9:40 AM 11/18/2020    8:13 AM 11/13/2019    8:08 AM 04/28/2018    5:21 PM 12/19/2015    8:30 PM  Advanced Directives  Does Patient Have a Medical Advance Directive? No No Yes Yes No Yes  Type of AEngineer, productionof AManuel GarciaLiving will  Does patient want to make changes to medical advance directive?    Yes (MAU/Ambulatory/Procedural Areas - Information given)  No - Patient declined  Copy of HKeyportin Chart?   No - copy requested   No - copy requested  Would patient like information on creating a medical advance directive? Yes (MAU/Ambulatory/Procedural Areas -  Information given)         Current Medications (verified) Outpatient Encounter Medications as of 11/25/2021  Medication Sig   aspirin 81 MG tablet Take 81 mg by mouth daily.   latanoprost (XALATAN) 0.005 % ophthalmic solution Place 1 drop into both eyes at bedtime.   losartan (COZAAR) 100 MG tablet TAKE ONE TABLET BY MOUTH DAILY   meloxicam (MOBIC) 15 MG tablet Take 1 tablet by mouth daily. As needed   omeprazole (PRILOSEC) 20 MG capsule TAKE ONE CAPSULE BY MOUTH DAILY   rosuvastatin (CRESTOR) 5 MG tablet Take 1 tablet (5 mg total) by mouth once a week.   Sildenafil Citrate (VIAGRA PO) Take by mouth. As needed   traZODone (DESYREL) 50 MG tablet TAKE 1/2 TO 1 TABLET BY MOUTH EVERY NIGHT AT BEDTIME AS NEEDED FOR SLEEP   zolpidem (AMBIEN) 5 MG tablet Take 1 tablet (5 mg total) by mouth at bedtime as needed for sleep (do not drive for 8 hours after taking).   No facility-administered encounter medications on file as of 11/25/2021.    Allergies (verified) Patient has no known allergies.   History: Past Medical History:  Diagnosis Date   BCC (basal cell carcinoma of skin)    2008 or 2009 left shoulder   ED (erectile dysfunction)    Elevated liver function tests    2020 labs normal- issues in past    Family history of bladder cancer    Family history of prostate cancer  Hypertension    Past Surgical History:  Procedure Laterality Date   WRIST GANGLION EXCISION     he reports several cysts in past   Family History  Problem Relation Age of Onset   Stroke Mother        complications after a fall   Heart attack Father        age 87. smoker   Prostate cancer Father    Coronary artery disease Brother        stent. patient 17 years older than patient.    Hyperlipidemia Brother    Hypertension Brother    Cancer Brother        small cell lung cancer died at 39   Other Brother        airplane crash   Bladder Cancer Maternal Uncle        dx mid 63s   Prostate cancer Maternal  Uncle    Heart defect Son        transposition of arteries   Esophageal cancer Neg Hx    Colon cancer Neg Hx    Stomach cancer Neg Hx    Rectal cancer Neg Hx    Social History   Socioeconomic History   Marital status: Divorced    Spouse name: Not on file   Number of children: Not on file   Years of education: Not on file   Highest education level: Not on file  Occupational History   Occupation: semi retired  Tobacco Use   Smoking status: Former    Packs/day: 0.20    Years: 2.00    Total pack years: 0.40    Types: Cigarettes    Quit date: 05/02/1991    Years since quitting: 30.5   Smokeless tobacco: Never  Vaping Use   Vaping Use: Never used  Substance and Sexual Activity   Alcohol use: Yes    Alcohol/week: 2.0 standard drinks of alcohol    Types: 2 Standard drinks or equivalent per week    Comment: beer last night   Drug use: No   Sexual activity: Yes  Other Topics Concern   Not on file  Social History Narrative   Divorced. Dating some. 3 children from prior marriage. 4 grandsons. No pets.       Self employeed Materials engineer   Social Determinants of Health   Financial Resource Strain: Low Risk  (11/25/2021)   Overall Financial Resource Strain (CARDIA)    Difficulty of Paying Living Expenses: Not hard at all  Food Insecurity: No Food Insecurity (11/25/2021)   Hunger Vital Sign    Worried About Running Out of Food in the Last Year: Never true    [place] in the Last Year: Never true  Transportation Needs: No Transportation Needs (11/25/2021)   PRAPARE - Hydrologist (Medical): No    Lack of Transportation (Non-Medical): No  Physical Activity: Insufficiently Active (11/25/2021)   Exercise Vital Sign    Days of Exercise per Week: 2 days    Minutes of Exercise per Session: 30 min  Stress: No Stress Concern Present (11/25/2021)   [place]    Feeling of Stress :  Not at all  Social Connections: Moderately Integrated (11/25/2021)   Social Connection and Isolation Panel [NHANES]    Frequency of Communication with Friends and Family: More than three times a week    Frequency of Social Gatherings with Friends and Family: More than  three times a week    Attends Religious Services: 1 to 4 times per year    Active Member of Clubs or Organizations: Yes    Attends Archivist Meetings: 1 to 4 times per year    Marital Status: Divorced    Tobacco Counseling Counseling given: Not Answered   Clinical Intake:  Pre-visit preparation completed: Yes  Pain : No/denies pain     BMI - recorded: 35.66 Nutritional Status: BMI > 30  Obese Nutritional Risks: None Diabetes: Yes CBG done?: No Did pt. bring in CBG monitor from home?: No  How often do you need to have someone help you when you read instructions, pamphlets, or other written materials from your doctor or pharmacy?: 1 - Never  Diabetic?Nutrition Risk Assessment:  Has the patient had any N/V/D within the last 2 months?  No  Does the patient have any non-healing wounds?  No  Has the patient had any unintentional weight loss or weight gain?  No   Diabetes:  Is the patient diabetic?  Yes  If diabetic, was a CBG obtained today?  No  Did the patient bring in their glucometer from home?  No  How often do you monitor your CBG's? N/A.   Financial Strains and Diabetes Management:  Are you having any financial strains with the device, your supplies or your medication? No .  Does the patient want to be seen by Chronic Care Management for management of their diabetes?  No  Would the patient like to be referred to a Nutritionist or for Diabetic Management?  No   Diabetic Exams:  Diabetic Eye Exam: Completed 01/13/21 Diabetic Foot Exam: Completed 08/25/21   Interpreter Needed?: No  Information entered by :: Charlott Rakes, LPN   Activities of Daily Living    11/25/2021    8:07 AM   In your present state of health, do you have any difficulty performing the following activities:  Hearing? 0  Vision? 0  Difficulty concentrating or making decisions? 0  Walking or climbing stairs? 0  Dressing or bathing? 0  Doing errands, shopping? 0  Preparing Food and eating ? N  Using the Toilet? N  In the past six months, have you accidently leaked urine? N  Do you have problems with loss of bowel control? N  Managing your Medications? N  Managing your Finances? N  Housekeeping or managing your Housekeeping? N    Patient Care Team: Marin Olp, MD as PCP - General (Family Medicine) Madelin Rear, [facility] as Pharmacist (Pharmacist) Lavonna Monarch, MD as Consulting Physician (Dermatology)  Indicate any recent Medical Services you may have received from other than [facility] providers in the past year (date may be approximate).     Assessment:   This is a routine wellness examination for [place].  Hearing/Vision screen Hearing Screening - Comments:: Pt denies any hearing issues  Vision Screening - Comments:: Pt follows up with [facility] and [facility] Dr Sallye Ober    Dietary issues and exercise activities discussed: Current Exercise Habits: Home exercise routine, Type of exercise: walking, Time (Minutes): 30, Frequency (Times/Week): 2, Weekly Exercise (Minutes/Week): 60   Goals Addressed             This Visit's Progress    Patient Stated       Lose weight        Depression Screen    11/25/2021    8:05 AM 11/18/2020    8:12 AM 10/18/2020    7:54 AM  06/16/2020    8:26 AM 11/13/2019    8:05 AM 11/08/2018    8:24 AM 09/04/2017    8:14 AM  PHQ 2/9 Scores  PHQ - 2 Score 0 0 0 0 0 0 0    Fall Risk    11/25/2021    8:07 AM 11/18/2020    8:15 AM 10/18/2020    7:53 AM 06/16/2020    8:26 AM 11/13/2019    8:11 AM  Fall Risk   Falls in the past year? 0 0 0 0 0  Number falls in past yr: 0 0 0 0 0  Injury with Fall? 0 0 0 0 0  Risk for fall due to :  Impaired vision Impaired vision   Impaired vision  Follow up Falls prevention discussed Falls prevention discussed       FALL RISK PREVENTION PERTAINING TO THE HOME:  Any stairs in or around the home? Yes  If so, are there any without handrails? No  Home free of loose throw rugs in walkways, pet beds, electrical cords, etc? Yes  Adequate lighting in your home to reduce risk of falls? Yes   ASSISTIVE DEVICES UTILIZED TO PREVENT FALLS:  Life alert? No  Use of a cane, walker or w/c? No  Grab bars in the bathroom? No  Shower chair or bench in shower? No  Elevated toilet seat or a handicapped toilet? No   TIMED UP AND GO:  Was the test performed? No .   Cognitive Function:        11/25/2021    8:11 AM 11/18/2020    8:17 AM 11/13/2019    8:16 AM  6CIT Screen  What Year? 0 points 0 points 0 points  What month? 0 points 0 points 0 points  What time? 0 points 0 points 0 points  Count back from 20 0 points 0 points 0 points  Months in reverse 0 points 0 points 0 points  Repeat phrase 0 points 0 points 0 points  Total Score 0 points 0 points 0 points    Immunizations Immunization History  Administered Date(s) Administered   Fluad Quad(high Dose 65+) 05/16/2019   PFIZER(Purple Top)SARS-COV-2 Vaccination 06/14/2019, 07/09/2019, 04/09/2020   PNEUMOCOCCAL CONJUGATE-20 05/23/2021   Pneumococcal Conjugate-13 11/26/2019   Tdap 03/06/2013    TDAP status: Up to date  Flu Vaccine status: Up to date  Pneumococcal vaccine status: Up to date  Covid-19 vaccine status: Completed vaccines  Qualifies for Shingles Vaccine? Yes   Zostavax completed Yes   Shingrix Completed?: Yes  Screening Tests Health Maintenance  Topic Date Due   Zoster Vaccines- Shingrix (1 of 2) Never done   COVID-19 Vaccine (4 - Booster for Pfizer series) 06/04/2020   INFLUENZA VACCINE  11/29/2021   OPHTHALMOLOGY EXAM  01/13/2022   HEMOGLOBIN A1C  02/24/2022   FOOT EXAM  08/26/2022   TETANUS/TDAP   03/07/2023   COLONOSCOPY (Pts 45-72yr Insurance coverage will need to be confirmed)  06/01/2028   Pneumonia Vaccine 71 Years old  Completed   Hepatitis C Screening  Completed   HPV VACCINES  Aged Out    Health Maintenance  Health Maintenance Due  Topic Date Due   Zoster Vaccines- Shingrix (1 of 2) Never done   COVID-19 Vaccine (4 - Booster for Pfizer series) 06/04/2020    Colorectal cancer screening: Type of screening: Colonoscopy. Completed 06/01/21. Repeat every 7 years  Additional Screening:  Hepatitis C Screening: Completed 11/08/18  Vision Screening: Recommended annual ophthalmology exams for early  detection of glaucoma and other disorders of the eye. Is the patient up to date with their annual eye exam?  Yes  Who is the provider or what is the name of the office in which the patient attends annual eye exams? Dr Sallye Ober [facility] If pt is not established with a provider, would they like to be referred to a provider to establish care? No .   Dental Screening: Recommended annual dental exams for proper oral hygiene  Community Resource Referral / Chronic Care Management: CRR required this visit?  No   CCM required this visit?  No      Plan:     I have personally reviewed and noted the following in the patient's chart:   Medical and social history Use of alcohol, tobacco or illicit drugs  Current medications and supplements including opioid prescriptions. Patient is not currently taking opioid prescriptions. Functional ability and status Nutritional status Physical activity Advanced directives List of other physicians Hospitalizations, surgeries, and ER visits in previous 12 months Vitals Screenings to include cognitive, depression, and falls Referrals and appointments  In addition, I have reviewed and discussed with patient certain preventive protocols, quality metrics, and best practice recommendations. A written personalized care plan for preventive services  as well as general preventive health recommendations were provided to patient.     [name], LPN   8/34/1960   Nurse Notes: none

## 2021-11-25 NOTE — Patient Instructions (Signed)
[name] , Thank you for taking time to come for your Medicare Wellness Visit. I appreciate your ongoing commitment to your health goals. Please review the following plan we discussed and let me know if I can assist you in the future.   Screening recommendations/referrals: Colonoscopy: done 06/01/21 repeat every 7 years  Recommended yearly ophthalmology/optometry visit for glaucoma screening and checkup Recommended yearly dental visit for hygiene and checkup  Vaccinations: Influenza vaccine: done 02/28/21 repeat every year  Pneumococcal vaccine: Up to date Tdap vaccine: done 03/06/13 repeat every 10 years  Shingles vaccine: Shingrix discussed. Please contact your pharmacy for coverage information.    Covid-19: completed 2/13, 3/10, 04/09/20  Advanced directives: Advance directive discussed with you today. I have provided a copy for you to complete at home and have notarized. Once this is complete please bring a copy in to our office so we can scan it into your chart.  Conditions/risks identified: lose weight   Next appointment: Follow up in one year for your annual wellness visit.   Preventive Care 65 Years and Older, Male Preventive care refers to lifestyle choices and visits with your health care provider that can promote health and wellness. What does preventive care include? A yearly physical exam. This is also called an annual well check. Dental exams once or twice a year. Routine eye exams. Ask your health care provider how often you should have your eyes checked. Personal lifestyle choices, including: Daily care of your teeth and gums. Regular physical activity. Eating a healthy diet. Avoiding tobacco and drug use. Limiting alcohol use. Practicing safe sex. Taking low doses of aspirin every day. Taking vitamin and mineral supplements as recommended by your health care provider. What happens during an annual well check? The services and screenings done by your health care  provider during your annual well check will depend on your age, overall health, lifestyle risk factors, and family history of disease. Counseling  Your health care provider may ask you questions about your: Alcohol use. Tobacco use. Drug use. Emotional well-being. Home and relationship well-being. Sexual activity. Eating habits. History of falls. Memory and ability to understand (cognition). Work and work Statistician. Screening  You may have the following tests or measurements: Height, weight, and BMI. Blood pressure. Lipid and cholesterol levels. These may be checked every 5 years, or more frequently if you are over 4 years old. Skin check. Lung cancer screening. You may have this screening every year starting at age 4 if you have a 30-pack-year history of smoking and currently smoke or have quit within the past 15 years. Fecal occult blood test (FOBT) of the stool. You may have this test every year starting at age 60. Flexible sigmoidoscopy or colonoscopy. You may have a sigmoidoscopy every 5 years or a colonoscopy every 10 years starting at age 71. Prostate cancer screening. Recommendations will vary depending on your family history and other risks. Hepatitis C blood test. Hepatitis B blood test. Sexually transmitted disease (STD) testing. Diabetes screening. This is done by checking your blood sugar (glucose) after you have not eaten for a while (fasting). You may have this done every 1-3 years. Abdominal aortic aneurysm (AAA) screening. You may need this if you are a current or former smoker. Osteoporosis. You may be screened starting at age 35 if you are at high risk. Talk with your health care provider about your test results, treatment options, and if necessary, the need for more tests. Vaccines  Your health care provider may recommend  certain vaccines, such as: Influenza vaccine. This is recommended every year. Tetanus, diphtheria, and acellular pertussis (Tdap, Td)  vaccine. You may need a Td booster every 10 years. Zoster vaccine. You may need this after age 27. Pneumococcal 13-valent conjugate (PCV13) vaccine. One dose is recommended after age 41. Pneumococcal polysaccharide (PPSV23) vaccine. One dose is recommended after age 36. Talk to your health care provider about which screenings and vaccines you need and how often you need them. This information is not intended to replace advice given to you by your health care provider. Make sure you discuss any questions you have with your health care provider. Document Released: 05/14/2015 Document Revised: 01/05/2016 Document Reviewed: 02/16/2015 Elsevier Interactive Patient Education  2017 [place] Prevention in the Home Falls can cause injuries. They can happen to people of all ages. There are many things you can do to make your home safe and to help prevent falls. What can I do on the outside of my home? Regularly fix the edges of walkways and driveways and fix any cracks. Remove anything that might make you trip as you walk through a door, such as a raised step or threshold. Trim any bushes or trees on the path to your home. Use bright outdoor lighting. Clear any walking paths of anything that might make someone trip, such as rocks or tools. Regularly check to see if handrails are loose or broken. Make sure that both sides of any steps have handrails. Any raised decks and porches should have guardrails on the edges. Have any leaves, snow, or ice cleared regularly. Use sand or salt on walking paths during winter. Clean up any spills in your garage right away. This includes oil or grease spills. What can I do in the bathroom? Use night lights. Install grab bars by the toilet and in the tub and shower. Do not use towel bars as grab bars. Use non-skid mats or decals in the tub or shower. If you need to sit down in the shower, use a plastic, non-slip stool. Keep the floor dry. Clean up any  water that spills on the floor as soon as it happens. Remove soap buildup in the tub or shower regularly. Attach bath mats securely with double-sided non-slip rug tape. Do not have throw rugs and other things on the floor that can make you trip. What can I do in the bedroom? Use night lights. Make sure that you have a light by your bed that is easy to reach. Do not use any sheets or blankets that are too big for your bed. They should not hang down onto the floor. Have a firm chair that has side arms. You can use this for support while you get dressed. Do not have throw rugs and other things on the floor that can make you trip. What can I do in the kitchen? Clean up any spills right away. Avoid walking on wet floors. Keep items that you use a lot in easy-to-reach places. If you need to reach something above you, use a strong step stool that has a grab bar. Keep electrical cords out of the way. Do not use floor polish or wax that makes floors slippery. If you must use wax, use non-skid floor wax. Do not have throw rugs and other things on the floor that can make you trip. What can I do with my stairs? Do not leave any items on the stairs. Make sure that there are handrails on both sides of the  stairs and use them. Fix handrails that are broken or loose. Make sure that handrails are as long as the stairways. Check any carpeting to make sure that it is firmly attached to the stairs. Fix any carpet that is loose or worn. Avoid having throw rugs at the top or bottom of the stairs. If you do have throw rugs, attach them to the floor with carpet tape. Make sure that you have a light switch at the top of the stairs and the bottom of the stairs. If you do not have them, ask someone to add them for you. What else can I do to help prevent falls? Wear shoes that: Do not have high heels. Have rubber bottoms. Are comfortable and fit you well. Are closed at the toe. Do not wear sandals. If you use a  stepladder: Make sure that it is fully opened. Do not climb a closed stepladder. Make sure that both sides of the stepladder are locked into place. Ask someone to hold it for you, if possible. Clearly mark and make sure that you can see: Any grab bars or handrails. First and last steps. Where the edge of each step is. Use tools that help you move around (mobility aids) if they are needed. These include: Canes. Walkers. Scooters. Crutches. Turn on the lights when you go into a dark area. Replace any light bulbs as soon as they burn out. Set up your furniture so you have a clear path. Avoid moving your furniture around. If any of your floors are uneven, fix them. If there are any pets around you, be aware of where they are. Review your medicines with your doctor. Some medicines can make you feel dizzy. This can increase your chance of falling. Ask your doctor what other things that you can do to help prevent falls. This information is not intended to replace advice given to you by your health care provider. Make sure you discuss any questions you have with your health care provider. Document Released: 02/11/2009 Document Revised: 09/23/2015 Document Reviewed: 05/22/2014 Elsevier Interactive Patient Education  2017 [employer].

## 2021-12-01 ENCOUNTER — Ambulatory Visit: Payer: Medicare Other

## 2022-01-12 DIAGNOSIS — H31011 Macula scars of posterior pole (postinflammatory) (post-traumatic), right eye: Secondary | ICD-10-CM | POA: Diagnosis not present

## 2022-01-12 DIAGNOSIS — H2513 Age-related nuclear cataract, bilateral: Secondary | ICD-10-CM | POA: Diagnosis not present

## 2022-01-12 DIAGNOSIS — H401131 Primary open-angle glaucoma, bilateral, mild stage: Secondary | ICD-10-CM | POA: Diagnosis not present

## 2022-01-23 ENCOUNTER — Encounter: Payer: Self-pay | Admitting: *Deleted

## 2022-02-07 ENCOUNTER — Other Ambulatory Visit: Payer: Self-pay | Admitting: Family Medicine

## 2022-02-08 ENCOUNTER — Other Ambulatory Visit: Payer: Self-pay | Admitting: Family Medicine

## 2022-02-28 ENCOUNTER — Encounter: Payer: Self-pay | Admitting: Family Medicine

## 2022-02-28 ENCOUNTER — Ambulatory Visit (INDEPENDENT_AMBULATORY_CARE_PROVIDER_SITE_OTHER): Payer: Medicare Other | Admitting: Family Medicine

## 2022-02-28 VITALS — BP 130/80 | HR 85 | Temp 97.5°F | Ht 71.0 in | Wt 262.8 lb

## 2022-02-28 DIAGNOSIS — Z87891 Personal history of nicotine dependence: Secondary | ICD-10-CM | POA: Diagnosis not present

## 2022-02-28 DIAGNOSIS — R351 Nocturia: Secondary | ICD-10-CM | POA: Diagnosis not present

## 2022-02-28 DIAGNOSIS — E785 Hyperlipidemia, unspecified: Secondary | ICD-10-CM

## 2022-02-28 DIAGNOSIS — E119 Type 2 diabetes mellitus without complications: Secondary | ICD-10-CM

## 2022-02-28 NOTE — Patient Instructions (Addendum)
Have [facility] send [place] copy of Diabetic Eye Exam after completed.   Let us know when you get your Flu and [facility] vaccines.  Schedule a lab visit at the check out desk within 2 weeks. Return for future fasting labs meaning nothing but water after midnight please. Ok to take your medications with water.  -sorry for inconvenience of lab being closed today- thank you for your understanding  Recommended follow up: Return in about 6 months (around 08/29/2022) for followup or sooner if needed.Schedule b4 you leave.

## 2022-02-28 NOTE — Progress Notes (Signed)
Phone [phone] In person visit   Subjective:   Dillon [name] is a 71 y.o. year old very pleasant male patient who presents for/with See problem oriented charting Chief Complaint  Patient presents with   Follow-up   Diabetes   Hyperlipidemia   Hypertension   Past Medical History-  Patient Active Problem List   Diagnosis Date Noted   Diabetes mellitus without complication ([place]) 20/25/4270    Priority: High   Morbid obesity ([place]) 05/09/2016    Priority: High   Hyperlipidemia, unspecified 05/06/2018    Priority: Medium    GERD (gastroesophageal reflux disease) 05/06/2018    Priority: Medium    Former smoker 05/06/2018    Priority: Medium    OSA (obstructive sleep apnea) 11/30/2015    Priority: Medium    Elevated PSA 08/25/2015    Priority: Medium    Mild stage glaucoma(365.71) 02/12/2011    Priority: Medium    Venous tributary (branch) occlusion of retina 02/12/2011    Priority: Medium    Essential hypertension 05/16/2007    Priority: Medium    Genetic testing 02/02/2020    Priority: Low   Family history of prostate cancer     Priority: Low   Family history of bladder cancer     Priority: Low   Family history of genetic disease 11/26/2019    Priority: Low   Insomnia 05/06/2018    Priority: Low   ERECTILE DYSFUNCTION 05/07/2008    Priority: Low    Medications- reviewed and updated Current Outpatient Medications  Medication Sig Dispense Refill   aspirin 81 MG tablet Take 81 mg by mouth daily.     latanoprost (XALATAN) 0.005 % ophthalmic solution Place 1 drop into both eyes at bedtime.     losartan (COZAAR) 100 MG tablet TAKE ONE TABLET BY MOUTH DAILY 90 tablet 3   meloxicam (MOBIC) 15 MG tablet Take 1 tablet by mouth daily. As needed     omeprazole (PRILOSEC) 20 MG capsule TAKE ONE CAPSULE BY MOUTH DAILY 90 capsule 3   rosuvastatin (CRESTOR) 5 MG tablet TAKE ONE TABLET BY MOUTH ONCE WEEKLY 13 tablet 3   Sildenafil Citrate (VIAGRA PO) Take by mouth. As  needed     traZODone (DESYREL) 50 MG tablet TAKE 1/2 TO 1 TABLET BY MOUTH EVERY NIGHT AT BEDTIME AS NEEDED FOR SLEEP 30 tablet 3   zolpidem (AMBIEN) 5 MG tablet Take 1 tablet (5 mg total) by mouth at bedtime as needed for sleep (do not drive for 8 hours after taking). 15 tablet 1   No current facility-administered medications for this visit.     Objective:  BP 130/80   Pulse 85   Temp (!) 97.5 F (36.4 C)   Ht '5\' 11"'$  (1.803 m)   Wt 262 lb 12.8 oz (119.2 kg)   SpO2 95%   BMI 36.65 kg/m  Gen: NAD, resting comfortably CV: RRR no murmurs rubs or gallops Lungs: CTAB no crackles, wheeze, rhonchi Ext: no edema Skin: warm, dry    Assessment and Plan   #social update- has cut back on some of his stressors with work- cut back on ASAP for instance  # Diabetes S: Medication: none, diet controlled CBGs- does not check- but has glucometer Exercise and diet- wanting to ramp up since cooled off with work on exercise. Weight up about 7 lbs but heavier clothes today- also noted up at home Lab Results  Component Value Date   HGBA1C 6.7 (H) 08/25/2021   HGBA1C 6.4 03/18/2021  HGBA1C 6.9 (H) 10/18/2020  A/P: hopefully stable- update a1c with labs. Continue without meds for now -eye exam- encouraged him to send [place] records- has scheduled with [facility] -also checks in with [facility] for prior branch retinal occlusion   #hypertension S: medication:  losartan '100mg'$  BP Readings from Last 3 Encounters:  02/28/22 130/80  08/25/21 108/64  06/01/21 122/82  A/P: Controlled. Continue current medications.   #hyperlipidemia S: Medication:Rosuvastatin 5 mg once a week, asa 81 mg Lab Results  Component Value Date   CHOL 152 10/18/2020   HDL 29.30 (L) 10/18/2020   LDLCALC 85 10/18/2020   LDLDIRECT 69.0 06/16/2020   TRIG 187.0 (H) 10/18/2020   CHOLHDL 5 10/18/2020  A/P: reasonable control though ideal LDL would be under 70 - update lipids and continue to push on lifestyle  changes  #Insomnia-primarily uses trazodone but has Ambien available if needed- sleep is better lately with less stress. Wakes up once but goes to pee  #elevated PSA_ follows with [facility] urology Dr. Amalia Hailey- they did not check him in and told to reschedule- needs psa with [place] as a result- we will update  Lab Results  Component Value Date   PSA 1.83 10/18/2020   PSA 1.99 09/04/2017   PSA 2.39 08/14/2016    Health Maintenance   Topic Date Due  Colonoscopy-  06/01/21 with 7 year repeat Former smoker-  aaa neg 11/28/2018. Will do UA to screen for bladder cancer- look for hematuria See PSA discussion above Plans at pharmacy-  Let us know when you get your Flu and [facility] vaccines.  Recommended follow up: Return in about 6 months (around 08/29/2022) for followup or sooner if needed.Schedule b4 you leave. Future Appointments  Date Time Provider [place]  12/01/2022  8:00 AM [facility]   Lab/Order associations:   ICD-10-CM   1. Hyperlipidemia, unspecified hyperlipidemia type  E78.5     2. Diabetes mellitus without complication (HCC)  N46.2 Microalbumin / creatinine urine ratio    CBC with Differential/Platelet    Comprehensive metabolic panel    Lipid panel    Hemoglobin A1c    3. Nocturia  R35.1 PSA    Urinalysis, Routine w reflex microscopic    4. Former smoker  Z87.891 Urinalysis, Routine w reflex microscopic      No orders of the defined types were placed in this encounter.   Return precautions advised.  Garret Reddish, MD

## 2022-03-02 ENCOUNTER — Other Ambulatory Visit (INDEPENDENT_AMBULATORY_CARE_PROVIDER_SITE_OTHER): Payer: Medicare Other

## 2022-03-02 DIAGNOSIS — E119 Type 2 diabetes mellitus without complications: Secondary | ICD-10-CM

## 2022-03-02 DIAGNOSIS — Z87891 Personal history of nicotine dependence: Secondary | ICD-10-CM | POA: Diagnosis not present

## 2022-03-02 DIAGNOSIS — R351 Nocturia: Secondary | ICD-10-CM | POA: Diagnosis not present

## 2022-03-02 LAB — COMPREHENSIVE METABOLIC PANEL
ALT: 50 U/L (ref 0–53)
AST: 43 U/L — ABNORMAL HIGH (ref 0–37)
Albumin: 4.6 g/dL (ref 3.5–5.2)
Alkaline Phosphatase: 56 U/L (ref 39–117)
BUN: 19 mg/dL (ref 6–23)
CO2: 28 mEq/L (ref 19–32)
Calcium: 9.7 mg/dL (ref 8.4–10.5)
Chloride: 97 mEq/L (ref 96–112)
Creatinine, Ser: 1.18 mg/dL (ref 0.40–1.50)
GFR: 62.83 mL/min (ref >60.00)
Glucose, Bld: 135 mg/dL — ABNORMAL HIGH (ref 70–99)
Potassium: 3.9 mEq/L (ref 3.5–5.1)
Sodium: 134 mEq/L — ABNORMAL LOW (ref 135–145)
Total Bilirubin: 0.9 mg/dL (ref 0.2–1.2)
Total Protein: 7.5 g/dL (ref 6.0–8.3)

## 2022-03-02 LAB — CBC WITH DIFFERENTIAL/PLATELET
Basophils Absolute: 0.1 10*3/uL (ref 0.0–0.1)
Basophils Relative: 1.8 % (ref 0.0–3.0)
Eosinophils Absolute: 0.2 10*3/uL (ref 0.0–0.7)
Eosinophils Relative: 3.8 % (ref 0.0–5.0)
HCT: 46.4 % (ref 39.0–52.0)
Hemoglobin: 16.1 g/dL (ref 13.0–17.0)
Lymphocytes Relative: 45.4 % (ref 12.0–46.0)
Lymphs Abs: 2.5 10*3/uL (ref 0.7–4.0)
MCHC: 34.6 g/dL (ref 30.0–36.0)
MCV: 94.2 fl (ref 78.0–100.0)
Monocytes Absolute: 0.6 10*3/uL (ref 0.1–1.0)
Monocytes Relative: 11.7 % (ref 3.0–12.0)
Neutro Abs: 2.1 10*3/uL (ref 1.4–7.7)
Neutrophils Relative %: 37.3 % — ABNORMAL LOW (ref 43.0–77.0)
Platelets: 140 10*3/uL — ABNORMAL LOW (ref 150.0–400.0)
RBC: 4.93 Mil/uL (ref 4.22–5.81)
RDW: 13.1 % (ref 11.5–15.5)
WBC: 5.6 10*3/uL (ref 4.0–10.5)

## 2022-03-02 LAB — URINALYSIS, ROUTINE W REFLEX MICROSCOPIC
Bilirubin Urine: NEGATIVE
Hgb urine dipstick: NEGATIVE
Ketones, ur: NEGATIVE
Leukocytes,Ua: NEGATIVE
Nitrite: NEGATIVE
RBC / HPF: NONE SEEN (ref 0–?)
Specific Gravity, Urine: 1.01 (ref 1.000–1.030)
Total Protein, Urine: NEGATIVE
Urine Glucose: NEGATIVE
Urobilinogen, UA: 0.2 (ref 0.0–1.0)
WBC, UA: NONE SEEN (ref 0–?)
pH: 6.5 (ref 5.0–8.0)

## 2022-03-02 LAB — LIPID PANEL
Cholesterol: 181 mg/dL (ref 0–200)
HDL: 33.4 mg/dL — ABNORMAL LOW (ref >39.00)
NonHDL: 147.46
Total CHOL/HDL Ratio: 5
Triglycerides: 306 mg/dL — ABNORMAL HIGH (ref 0.0–149.0)
VLDL: 61.2 mg/dL — ABNORMAL HIGH (ref 0.0–40.0)

## 2022-03-02 LAB — MICROALBUMIN / CREATININE URINE RATIO
Creatinine,U: 104.9 mg/dL
Microalb Creat Ratio: 5 mg/g (ref 0.0–30.0)
Microalb, Ur: 5.2 mg/dL — ABNORMAL HIGH (ref 0.0–1.9)

## 2022-03-02 LAB — PSA: PSA: 1.56 ng/mL (ref 0.10–4.00)

## 2022-03-02 LAB — LDL CHOLESTEROL, DIRECT: Direct LDL: 113 mg/dL

## 2022-03-02 LAB — HEMOGLOBIN A1C: Hgb A1c MFr Bld: 6.8 % — ABNORMAL HIGH (ref 4.6–6.5)

## 2022-03-06 ENCOUNTER — Encounter: Payer: Self-pay | Admitting: Family Medicine

## 2022-03-21 DIAGNOSIS — H5213 Myopia, bilateral: Secondary | ICD-10-CM | POA: Diagnosis not present

## 2022-03-21 DIAGNOSIS — E119 Type 2 diabetes mellitus without complications: Secondary | ICD-10-CM | POA: Diagnosis not present

## 2022-03-21 DIAGNOSIS — H2513 Age-related nuclear cataract, bilateral: Secondary | ICD-10-CM | POA: Diagnosis not present

## 2022-03-21 DIAGNOSIS — H40053 Ocular hypertension, bilateral: Secondary | ICD-10-CM | POA: Diagnosis not present

## 2022-03-21 DIAGNOSIS — H348312 Tributary (branch) retinal vein occlusion, right eye, stable: Secondary | ICD-10-CM | POA: Diagnosis not present

## 2022-03-21 LAB — HM DIABETES EYE EXAM

## 2022-03-30 ENCOUNTER — Encounter: Payer: Self-pay | Admitting: Family Medicine

## 2022-04-18 IMAGING — CT CT HEAD W/O CM
4 series · 16 of 47 positions shown, 18 images · non-contrast
Comparison: None.

CLINICAL DATA: Headache

EXAM:
CT HEAD WITHOUT CONTRAST
TECHNIQUE: Contiguous axial images were obtained from the base of the skull
through the vertex without intravenous contrast.

[Series 2: head wo · axial · 0.46mm/px · z∈[-183,-63]mm · 7 of 32 slices shown, 9 images]
[im 4/32  brain]
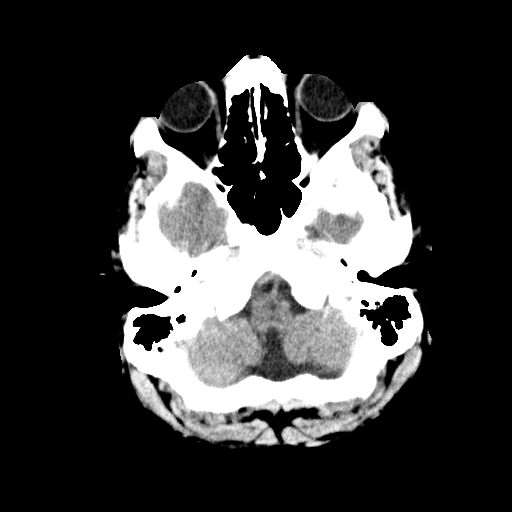
[im 4/32  bone]
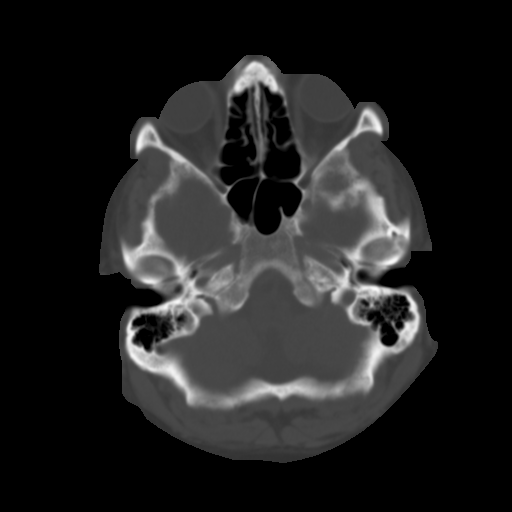
[im 8/32  brain]
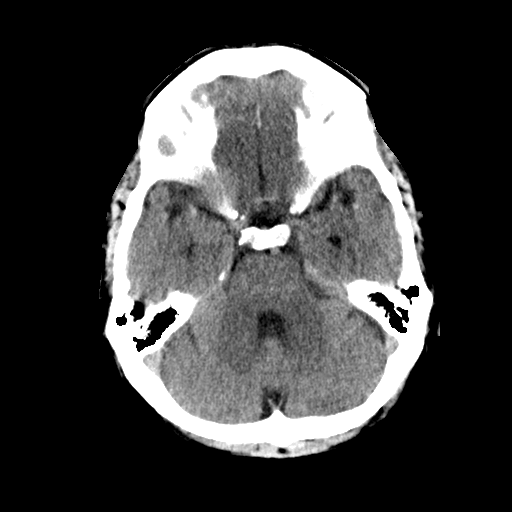
[im 12/32  brain]
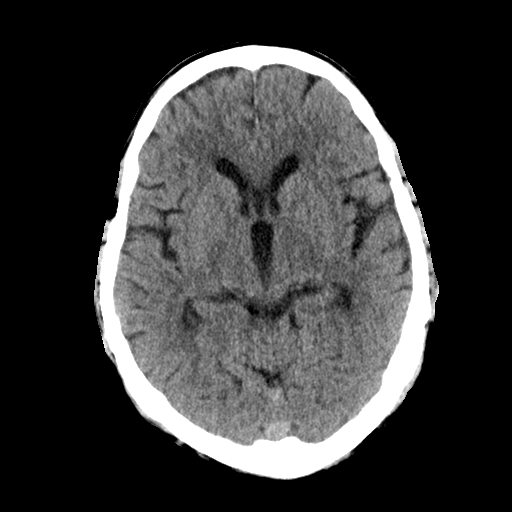
[im 16/32  brain]
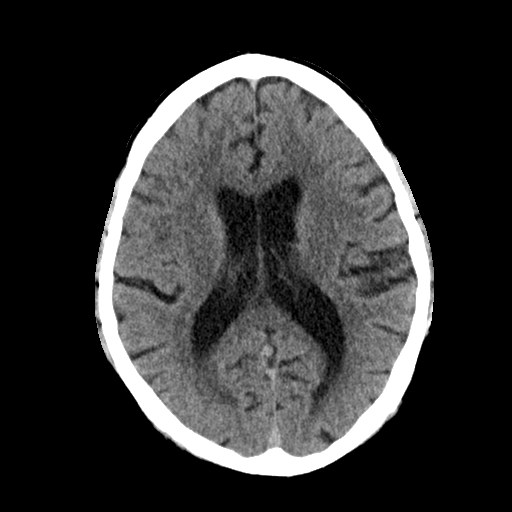
[im 20/32  brain]
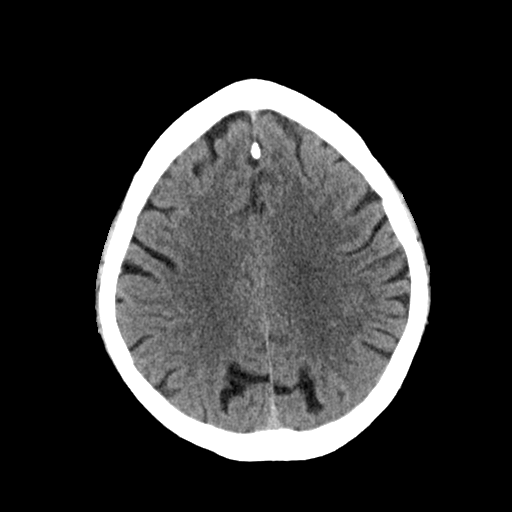
[im 20/32  bone]
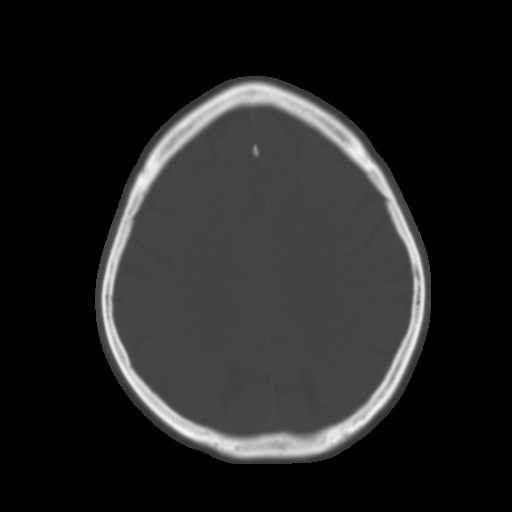
[im 24/32  brain]
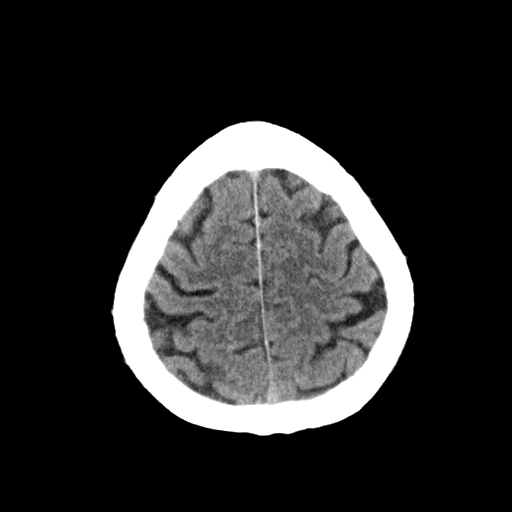
[im 28/32  brain]
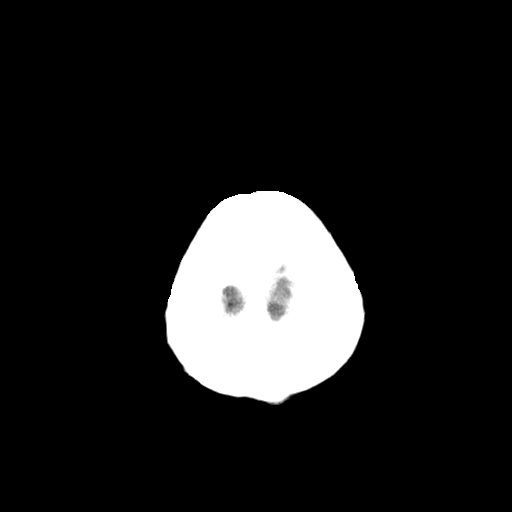

[Series 3: head bone · axial · 0.46mm/px · z∈[-184,-152]mm · 3 of 79 slices shown]
[im 8/79  bone]
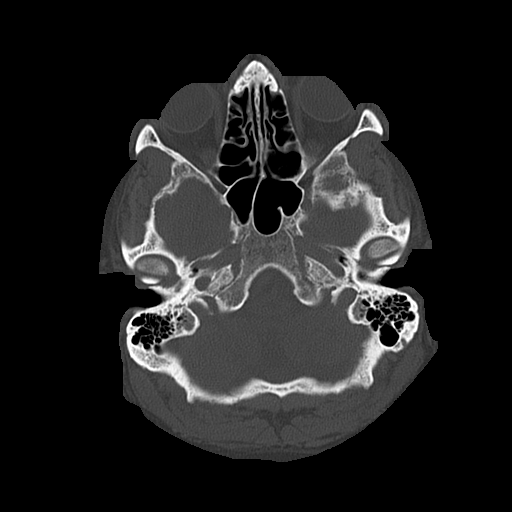
[im 16/79  bone]
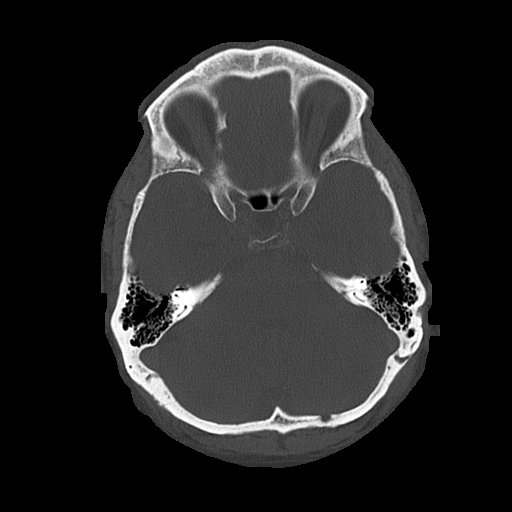
[im 24/79  bone]
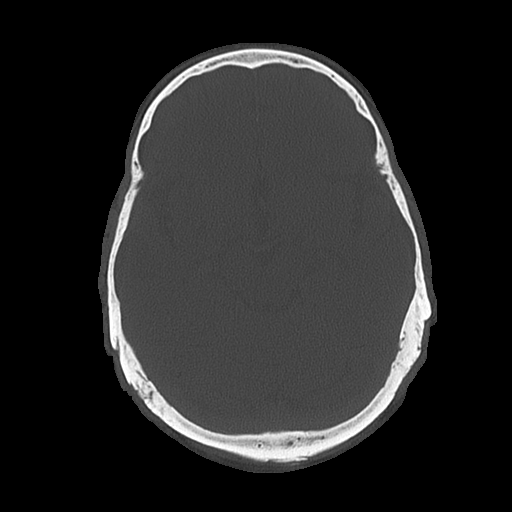

[Series 4: coronal soft · coronal · 0.31mm/px · 3 of 72 slices shown]
[im 24/72  brain]
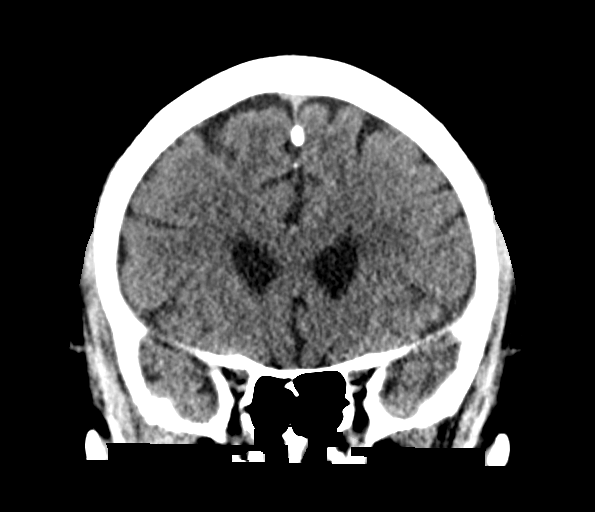
[im 32/72  brain]
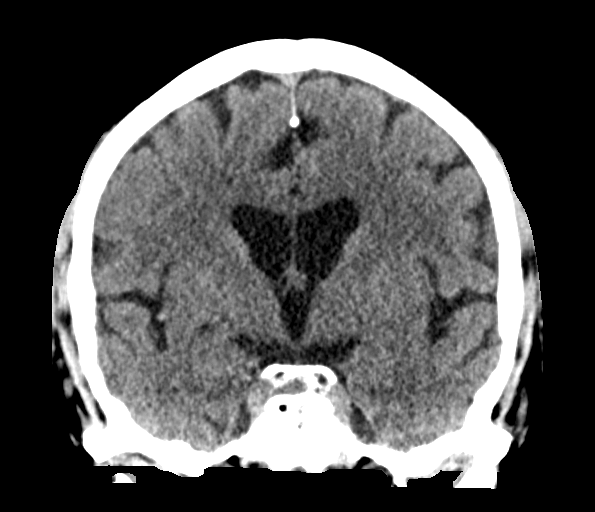
[im 40/72  brain]
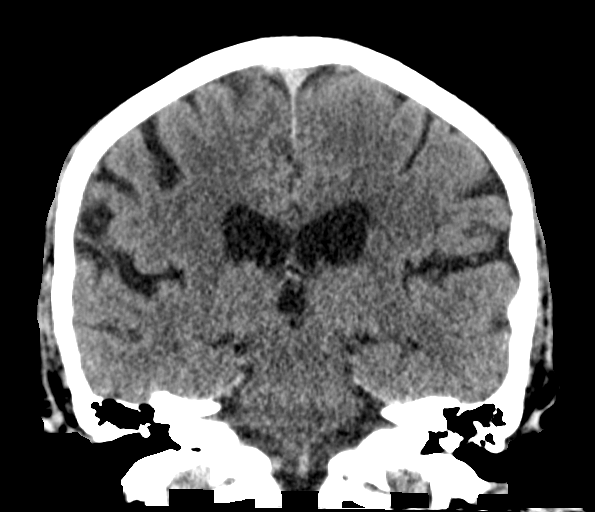

[Series 5: sagittal soft · sagittal · 0.31mm/px · 3 of 63 slices shown]
[im 21/63  brain]
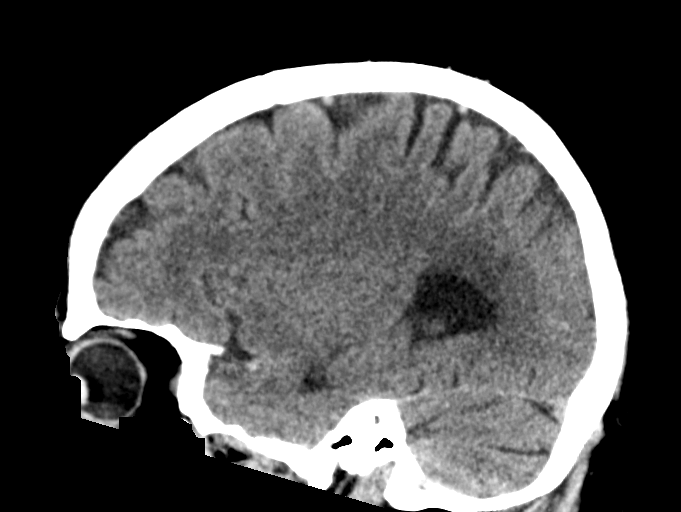
[im 32/63  brain]
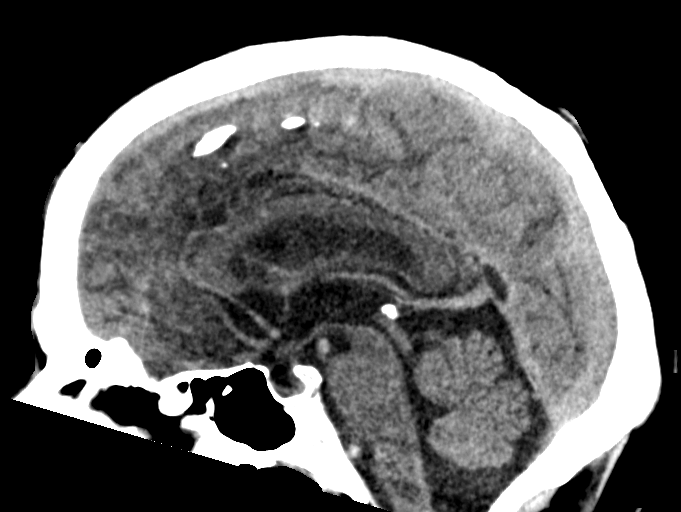
[im 42/63  brain]
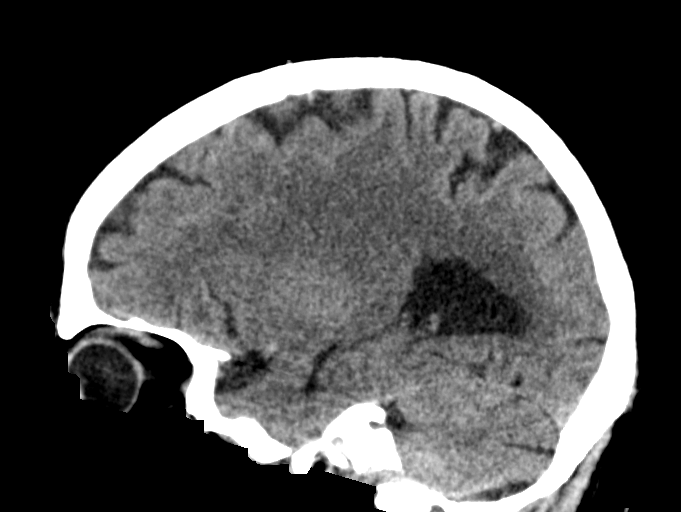

[16 of 47 positions shown; findings below may reference images not displayed]

FINDINGS: Brain: No evidence of acute infarction, hemorrhage, hydrocephalus,
extra-axial collection or mass lesion/mass effect.

Vascular: No hyperdense vessel or unexpected calcification.

Skull: Normal. Negative for fracture or focal lesion.

Sinuses/Orbits: No acute finding.

Other: None.
IMPRESSION: No acute intracranial abnormalities.

## 2022-05-22 DIAGNOSIS — R972 Elevated prostate specific antigen [PSA]: Secondary | ICD-10-CM | POA: Diagnosis not present

## 2022-05-22 DIAGNOSIS — N529 Male erectile dysfunction, unspecified: Secondary | ICD-10-CM | POA: Diagnosis not present

## 2022-06-01 ENCOUNTER — Encounter: Payer: Self-pay | Admitting: Family Medicine

## 2022-06-02 ENCOUNTER — Other Ambulatory Visit: Payer: Self-pay

## 2022-06-02 DIAGNOSIS — E785 Hyperlipidemia, unspecified: Secondary | ICD-10-CM

## 2022-06-02 DIAGNOSIS — E119 Type 2 diabetes mellitus without complications: Secondary | ICD-10-CM

## 2022-06-06 ENCOUNTER — Other Ambulatory Visit (INDEPENDENT_AMBULATORY_CARE_PROVIDER_SITE_OTHER): Payer: Medicare Other

## 2022-06-06 DIAGNOSIS — E785 Hyperlipidemia, unspecified: Secondary | ICD-10-CM | POA: Diagnosis not present

## 2022-06-06 DIAGNOSIS — E119 Type 2 diabetes mellitus without complications: Secondary | ICD-10-CM

## 2022-06-06 LAB — COMPREHENSIVE METABOLIC PANEL
ALT: 31 U/L (ref 0–53)
AST: 28 U/L (ref 0–37)
Albumin: 4.8 g/dL (ref 3.5–5.2)
Alkaline Phosphatase: 63 U/L (ref 39–117)
BUN: 16 mg/dL (ref 6–23)
CO2: 25 mEq/L (ref 19–32)
Calcium: 9.8 mg/dL (ref 8.4–10.5)
Chloride: 98 mEq/L (ref 96–112)
Creatinine, Ser: 1.21 mg/dL (ref 0.40–1.50)
GFR: 60.85 mL/min (ref >60.00)
Glucose, Bld: 131 mg/dL — ABNORMAL HIGH (ref 70–99)
Potassium: 4.4 mEq/L (ref 3.5–5.1)
Sodium: 134 mEq/L — ABNORMAL LOW (ref 135–145)
Total Bilirubin: 1 mg/dL (ref 0.2–1.2)
Total Protein: 7.6 g/dL (ref 6.0–8.3)

## 2022-06-06 LAB — LDL CHOLESTEROL, DIRECT: Direct LDL: 88 mg/dL

## 2022-06-06 LAB — HEMOGLOBIN A1C: Hgb A1c MFr Bld: 7 % — ABNORMAL HIGH (ref 4.6–6.5)

## 2022-06-12 ENCOUNTER — Ambulatory Visit: Payer: Medicare Other | Admitting: Dermatology

## 2022-07-16 ENCOUNTER — Other Ambulatory Visit: Payer: Self-pay | Admitting: Family Medicine

## 2022-08-28 ENCOUNTER — Ambulatory Visit: Payer: Medicare Other | Admitting: Family Medicine

## 2022-11-28 ENCOUNTER — Encounter: Payer: Self-pay | Admitting: Physician Assistant

## 2022-11-28 ENCOUNTER — Telehealth (INDEPENDENT_AMBULATORY_CARE_PROVIDER_SITE_OTHER): Payer: Medicare Other | Admitting: Physician Assistant

## 2022-11-28 VITALS — Ht 71.0 in | Wt 255.0 lb

## 2022-11-28 DIAGNOSIS — U071 COVID-19: Secondary | ICD-10-CM

## 2022-11-28 MED ORDER — NAPROXEN 500 MG PO TABS: 500.0000 mg | ORAL_TABLET | Freq: Two times a day (BID) | ORAL | 0 refills | Status: DC | PRN

## 2022-11-28 NOTE — Progress Notes (Signed)
Virtual Visit via Video   I connected with Dillon [name] on 11/28/22 at 11:20 AM EDT by a video enabled telemedicine application and verified that I am speaking with the correct person using two identifiers. Location patient: Home Location provider: [facility], Office Persons participating in the virtual visit: Dillon [name], Jarold Motto PA-C, Corky Mull, LPN   I discussed the limitations of evaluation and management by telemedicine and the availability of in person appointments. The patient expressed understanding and agreed to proceed.  I acted as a Neurosurgeon for [employer], PA-C [employer], LPN   Subjective:   HPI:   COVID-19 Positive Symptom onset: Sunday 7/28 Travel/contacts: unknown  Vaccination status: 3 vaccines Testing results: Home test positive yesterday  Has had COVID twice -- treated with molnupiravir in the past for both infections  Patient endorses the following symptoms: body aches, cough, chest congestion, fever 100.6  Patient denies the following symptoms: Denies chest pain, SHORTNESS OF BREATH. Not asthmatic, no history of COPD.  Treatments tried: Tylenol and Ibuprofen, Thera-Flu last night.   ROS: See pertinent positives and negatives per HPI.  Patient Active Problem List   Diagnosis Date Noted   Genetic testing 02/02/2020   Family history of prostate cancer    Family history of bladder cancer    Family history of genetic disease 11/26/2019   Hyperlipidemia, unspecified 05/06/2018   Insomnia 05/06/2018   GERD (gastroesophageal reflux disease) 05/06/2018   Former smoker 05/06/2018   Diabetes mellitus without complication (HCC) 09/05/2017   Morbid obesity (HCC) 05/09/2016   OSA (obstructive sleep apnea) 11/30/2015   Elevated PSA 08/25/2015   Mild stage glaucoma(365.71) 02/12/2011   Venous tributary (branch) occlusion of retina 02/12/2011   ERECTILE DYSFUNCTION 05/07/2008   Essential hypertension 05/16/2007    Social History    Tobacco Use   Smoking status: Former    Current packs/day: 0.00    Average packs/day: 0.2 packs/day for 2.0 years (0.4 ttl pk-yrs)    Types: Cigarettes    Start date: 05/01/1989    Quit date: 05/02/1991    Years since quitting: 31.5   Smokeless tobacco: Never  Substance Use Topics   Alcohol use: Yes    Alcohol/week: 2.0 standard drinks of alcohol    Types: 2 Standard drinks or equivalent per week    Comment: beer last night    Current Outpatient Medications:    aspirin 81 MG tablet, Take 81 mg by mouth daily., Disp: , Rfl:    latanoprost (XALATAN) 0.005 % ophthalmic solution, Place 1 drop into both eyes at bedtime., Disp: , Rfl:    losartan (COZAAR) 100 MG tablet, TAKE ONE TABLET BY MOUTH DAILY, Disp: 90 tablet, Rfl: 3   meloxicam (MOBIC) 15 MG tablet, Take 1 tablet by mouth daily. As needed, Disp: , Rfl:    naproxen (NAPROSYN) 500 MG tablet, Take 1 tablet (500 mg total) by mouth 2 (two) times daily as needed for moderate pain., Disp: 30 tablet, Rfl: 0   omeprazole (PRILOSEC) 20 MG capsule, TAKE ONE CAPSULE BY MOUTH DAILY, Disp: 90 capsule, Rfl: 3   rosuvastatin (CRESTOR) 5 MG tablet, TAKE ONE TABLET BY MOUTH ONCE WEEKLY, Disp: 13 tablet, Rfl: 3   Sildenafil Citrate (VIAGRA PO), Take by mouth. As needed, Disp: , Rfl:    traZODone (DESYREL) 50 MG tablet, TAKE 1/2 TO 1 TABLET BY MOUTH EVERY NIGHT AT BEDTIME AS NEEDED FOR SLEEP, Disp: 30 tablet, Rfl: 3  No Known Allergies  Objective:   VITALS:  Per patient if applicable, see vitals. GENERAL: Alert, appears well and in no acute distress. HEENT: Atraumatic, conjunctiva clear, no obvious abnormalities on inspection of external nose and ears. NECK: Normal movements of the head and neck. CARDIOPULMONARY: No increased WOB. Speaking in clear sentences. I:E ratio WNL.  MS: Moves all visible extremities without noticeable abnormality. PSYCH: Pleasant and cooperative, well-groomed. Speech normal rate and rhythm. Affect is appropriate. Insight  and judgement are appropriate. Attention is focused, linear, and appropriate.  NEURO: CN grossly intact. Oriented as arrived to appointment on time with no prompting. Moves both UE equally.  SKIN: No obvious lesions, wounds, erythema, or cyanosis noted on face or hands.  Assessment and Plan:   Dillon [name] was seen today for covid positive.  Diagnoses and all orders for this visit:  COVID-19  Other orders -     naproxen (NAPROSYN) 500 MG tablet; Take 1 tablet (500 mg total) by mouth 2 (two) times daily as needed for moderate pain.    No red flags on discussion, patient is not in any obvious distress during our visit. Discussed progression of most viral illnesses, and recommended supportive care at this point in time.  Recommend oral naproxen for body aches and poin. Can take tylenol for fever.  Discussed risks vs benefits with antivirals -- we decided at this time to hold off on this for now as we are not sure how beneficial they are at this point.  Discussed over the counter supportive care options, including Tylenol 500 mg q 8 hours, with recommendations to push fluids and rest. Reviewed return precautions including new/worsening fever, SOB, new/worsening cough, sudden onset changes of symptoms. Recommended need to self-quarantine and practice social distancing until symptoms resolve. I recommend that patient follow-up if symptoms worsen or persist despite treatment x 7-10 days, sooner if needed.  I discussed the assessment and treatment plan with the patient. The patient was provided an opportunity to ask questions and all were answered. The patient agreed with the plan and demonstrated an understanding of the instructions.   The patient was advised to call back or seek an in-person evaluation if the symptoms worsen or if the condition fails to improve as anticipated.    [place], [place] 11/28/2022

## 2022-12-07 DIAGNOSIS — L814 Other melanin hyperpigmentation: Secondary | ICD-10-CM | POA: Diagnosis not present

## 2022-12-07 DIAGNOSIS — L918 Other hypertrophic disorders of the skin: Secondary | ICD-10-CM | POA: Diagnosis not present

## 2022-12-07 DIAGNOSIS — L820 Inflamed seborrheic keratosis: Secondary | ICD-10-CM | POA: Diagnosis not present

## 2022-12-07 DIAGNOSIS — D485 Neoplasm of uncertain behavior of skin: Secondary | ICD-10-CM | POA: Diagnosis not present

## 2023-01-08 DIAGNOSIS — M7711 Lateral epicondylitis, right elbow: Secondary | ICD-10-CM | POA: Diagnosis not present

## 2023-01-08 DIAGNOSIS — M25521 Pain in right elbow: Secondary | ICD-10-CM | POA: Diagnosis not present

## 2023-01-11 DIAGNOSIS — M25521 Pain in right elbow: Secondary | ICD-10-CM | POA: Diagnosis not present

## 2023-01-11 DIAGNOSIS — Z789 Other specified health status: Secondary | ICD-10-CM | POA: Diagnosis not present

## 2023-01-16 ENCOUNTER — Ambulatory Visit (INDEPENDENT_AMBULATORY_CARE_PROVIDER_SITE_OTHER): Payer: Medicare Other

## 2023-01-16 VITALS — BP 130/82 | HR 96 | Temp 98.5°F | Wt 260.8 lb

## 2023-01-16 DIAGNOSIS — Z0000 Encounter for general adult medical examination without abnormal findings: Secondary | ICD-10-CM

## 2023-01-16 NOTE — Patient Instructions (Signed)
Dillon [name] , Thank you for taking time to come for your Medicare Wellness Visit. I appreciate your ongoing commitment to your health goals. Please review the following plan we discussed and let me know if I can assist you in the future.   Referrals/Orders/Follow-Ups/Clinician Recommendations: pt stated lose weight , Flu shot postponed at this time pt stated will follow up   This is a list of the screening recommended for you and due dates:  Health Maintenance  Topic Date Due   Zoster (Shingles) Vaccine (1 of 2) Never done   Complete foot exam   08/26/2022   Flu Shot  11/30/2022   Hemoglobin A1C  12/05/2022   COVID-19 Vaccine (4 - 2023-24 season) 12/31/2022   Yearly kidney health urinalysis for diabetes  03/03/2023   DTaP/Tdap/Td vaccine (2 - Td or Tdap) 03/07/2023   Eye exam for diabetics  03/22/2023   Yearly kidney function blood test for diabetes  06/07/2023   Medicare Annual Wellness Visit  01/16/2024   Colon Cancer Screening  06/01/2028   Pneumonia Vaccine  Completed   Hepatitis C Screening  Completed   HPV Vaccine  Aged Out    Advanced directives: (Declined) Advance directive discussed with you today. Even though you declined this today, please call our office should you change your mind, and we can give you the proper paperwork for you to fill out.  Next Medicare Annual Wellness Visit scheduled for next year: Yes

## 2023-01-16 NOTE — Progress Notes (Addendum)
Subjective:   Dillon [name] is a 72 y.o. male who presents for Medicare Annual/Subsequent preventive examination.  Visit Complete: In person  Patient Medicare AWV questionnaire was completed by the patient on 916/24; I have confirmed that all information answered by patient is correct and no changes since this date.  Cardiac Risk Factors include: advanced age (>14men, >24 women);diabetes mellitus;dyslipidemia;hypertension;male gender;obesity (BMI >30kg/m2)     Objective:    Today's Vitals   01/16/23 0753 01/16/23 1005  BP: 130/82   Pulse: (!) 101 96  Temp: 98.5 F (36.9 C)   SpO2: 97%   Weight: 260 lb 12.8 oz (118.3 kg)    Body mass index is 36.37 kg/m.     01/16/2023    8:05 AM 11/25/2021    8:06 AM 12/23/2020    9:40 AM 11/18/2020    8:13 AM 11/13/2019    8:08 AM 04/28/2018    5:21 PM 12/19/2015    8:30 PM  Advanced Directives  Does Patient Have a Medical Advance Directive? No No No Yes Yes No Yes  Type of Horticulturist, commercial of [place];Living will  Does patient want to make changes to medical advance directive?     Yes (MAU/Ambulatory/Procedural Areas - Information given)  No - Patient declined  Copy of Healthcare Power of Attorney in Chart?    No - copy requested   No - copy requested  Would patient like information on creating a medical advance directive? No - Patient declined Yes (MAU/Ambulatory/Procedural Areas - Information given)         Current Medications (verified) Outpatient Encounter Medications as of 01/16/2023  Medication Sig   aspirin 81 MG tablet Take 81 mg by mouth 2 (two) times a week.   latanoprost (XALATAN) 0.005 % ophthalmic solution Place 1 drop into both eyes at bedtime.   losartan (COZAAR) 100 MG tablet TAKE ONE TABLET BY MOUTH DAILY   meloxicam (MOBIC) 15 MG tablet Take 7.5 mg by mouth daily. As needed   omeprazole (PRILOSEC) 20 MG capsule TAKE ONE CAPSULE BY MOUTH DAILY (Patient taking  differently: as needed.)   rosuvastatin (CRESTOR) 5 MG tablet TAKE ONE TABLET BY MOUTH ONCE WEEKLY   Sildenafil Citrate (VIAGRA PO) Take by mouth. As needed   traZODone (DESYREL) 50 MG tablet TAKE 1/2 TO 1 TABLET BY MOUTH EVERY NIGHT AT BEDTIME AS NEEDED FOR SLEEP   [DISCONTINUED] naproxen (NAPROSYN) 500 MG tablet Take 1 tablet (500 mg total) by mouth 2 (two) times daily as needed for moderate pain.   No facility-administered encounter medications on file as of 01/16/2023.    Allergies (verified) Patient has no known allergies.   History: Past Medical History:  Diagnosis Date   BCC (basal cell carcinoma of skin)    2008 or 2009 left shoulder   ED (erectile dysfunction)    Elevated liver function tests    2020 labs normal- issues in past    Family history of bladder cancer    Family history of prostate cancer    Hypertension    Past Surgical History:  Procedure Laterality Date   WRIST GANGLION EXCISION     he reports several cysts in past   Family History  Problem Relation Age of Onset   Stroke Mother        complications after a fall   Heart attack Father        age 71. smoker   Prostate cancer Father  Coronary artery disease Brother        stent. patient 5 years older than patient.    Hyperlipidemia Brother    Hypertension Brother    Cancer Brother        small cell lung cancer died at 19   Other Brother        airplane crash   Bladder Cancer Maternal Uncle        dx mid 65s   Prostate cancer Maternal Uncle    Heart defect Son        transposition of arteries   Esophageal cancer Neg Hx    Colon cancer Neg Hx    Stomach cancer Neg Hx    Rectal cancer Neg Hx    Social History   Socioeconomic History   Marital status: Divorced    Spouse name: Not on file   Number of children: Not on file   Years of education: Not on file   Highest education level: Not on file  Occupational History   Occupation: semi retired  Tobacco Use   Smoking status: Former     Current packs/day: 0.00    Average packs/day: 0.2 packs/day for 2.0 years (0.4 ttl pk-yrs)    Types: Cigarettes    Start date: 05/01/1989    Quit date: 05/02/1991    Years since quitting: 31.7   Smokeless tobacco: Never  Vaping Use   Vaping status: Never Used  Substance and Sexual Activity   Alcohol use: Yes    Alcohol/week: 2.0 standard drinks of alcohol    Types: 2 Standard drinks or equivalent per week    Comment: beer last night   Drug use: No   Sexual activity: Yes  Other Topics Concern   Not on file  Social History Narrative   Divorced. Dating some. 3 children from prior marriage. 4 grandsons. No pets.       Self employeed Scientist, research (physical sciences)   Social Determinants of Health   Financial Resource Strain: Low Risk  (01/15/2023)   Overall Financial Resource Strain (CARDIA)    Difficulty of Paying Living Expenses: Not hard at all  Food Insecurity: No Food Insecurity (01/15/2023)   Hunger Vital Sign    Worried About Running Out of Food in the Last Year: Never true    Ran Out of Food in the Last Year: Never true  Transportation Needs: No Transportation Needs (01/15/2023)   PRAPARE - Administrator, Civil Service (Medical): No    Lack of Transportation (Non-Medical): No  Physical Activity: Insufficiently Active (01/15/2023)   Exercise Vital Sign    Days of Exercise per Week: 1 day    Minutes of Exercise per Session: 20 min  Stress: No Stress Concern Present (01/15/2023)   [employer] of Occupational Health - Occupational Stress Questionnaire    Feeling of Stress : Only a little  Social Connections: Moderately Integrated (01/15/2023)   Social Connection and Isolation Panel [NHANES]    Frequency of Communication with Friends and Family: Three times a week    Frequency of Social Gatherings with Friends and Family: Once a week    Attends Religious Services: More than 4 times per year    Active Member of [employer] or Organizations: Yes    Attends Banker  Meetings: 1 to 4 times per year    Marital Status: Divorced    Tobacco Counseling Counseling given: Not Answered   Clinical Intake:  Pre-visit preparation completed: Yes  Pain : No/denies pain  BMI - recorded: 36.67 Nutritional Status: BMI > 30  Obese Nutritional Risks: None Diabetes: Yes CBG done?: No Did pt. bring in CBG monitor from home?: No  How often do you need to have someone help you when you read instructions, pamphlets, or other written materials from your doctor or pharmacy?: 1 - Never  Interpreter Needed?: No  Information entered by :: Lanier Ensign, LPN   Activities of Daily Living    01/15/2023    7:50 PM  In your present state of health, do you have any difficulty performing the following activities:  Hearing? 0  Vision? 0  Difficulty concentrating or making decisions? 0  Walking or climbing stairs? 0  Dressing or bathing? 0  Doing errands, shopping? 0  Preparing Food and eating ? N  Using the Toilet? N  In the past six months, have you accidently leaked urine? N  Do you have problems with loss of bowel control? N  Managing your Medications? N  Managing your Finances? N  Housekeeping or managing your Housekeeping? N    Patient Care Team: Shelva Majestic, MD as PCP - General (Family Medicine) Dahlia Byes, [facility] (Inactive) as Pharmacist (Pharmacist) Janalyn Harder, MD (Inactive) as Consulting Physician (Dermatology)  Indicate any recent Medical Services you may have received from other than [facility] providers in the past year (date may be approximate).     Assessment:   This is a routine wellness examination for Alfred.  Hearing/Vision screen Hearing Screening - Comments:: Pt denies any hearing issues  Vision Screening - Comments:: [facility] for annual eye exams    Goals Addressed             This Visit's Progress    Patient Stated       Pt stated lose weight        Depression Screen    01/16/2023     8:06 AM 11/25/2021    8:05 AM 11/18/2020    8:12 AM 10/18/2020    7:54 AM 06/16/2020    8:26 AM 11/13/2019    8:05 AM 11/08/2018    8:24 AM  PHQ 2/9 Scores  PHQ - 2 Score 0 0 0 0 0 0 0    Fall Risk    01/15/2023    7:50 PM 11/25/2021    8:07 AM 11/18/2020    8:15 AM 10/18/2020    7:53 AM 06/16/2020    8:26 AM  Fall Risk   Falls in the past year? 0 0 0 0 0  Number falls in past yr: 0 0 0 0 0  Injury with Fall? 0 0 0 0 0  Risk for fall due to : Impaired vision Impaired vision Impaired vision    Follow up Falls prevention discussed Falls prevention discussed Falls prevention discussed      MEDICARE RISK AT HOME: Medicare Risk at Home Any stairs in or around the home?: Yes If so, are there any without handrails?: Yes Home free of loose throw rugs in walkways, pet beds, electrical cords, etc?: Yes Adequate lighting in your home to reduce risk of falls?: Yes Life alert?: No Use of a cane, walker or w/c?: No Grab bars in the bathroom?: No Shower chair or bench in shower?: No Elevated toilet seat or a handicapped toilet?: No  TIMED UP AND GO:  Was the test performed?  Yes  Length of time to ambulate 10 feet: 10 sec Gait steady and fast without use of assistive device  Cognitive Function:        01/16/2023    8:08 AM 11/25/2021    8:11 AM 11/18/2020    8:17 AM 11/13/2019    8:16 AM  6CIT Screen  What Year? 0 points 0 points 0 points 0 points  What month? 0 points 0 points 0 points 0 points  What time? 0 points 0 points 0 points 0 points  Count back from 20 0 points 0 points 0 points 0 points  Months in reverse 0 points 0 points 0 points 0 points  Repeat phrase 0 points 0 points 0 points 0 points  Total Score 0 points 0 points 0 points 0 points    Immunizations Immunization History  Administered Date(s) Administered   Fluad Quad(high Dose 65+) 05/16/2019   PFIZER(Purple Top)SARS-COV-2 Vaccination 06/14/2019, 07/09/2019, 04/09/2020   PNEUMOCOCCAL CONJUGATE-20 05/23/2021    Pneumococcal Conjugate-13 11/26/2019   Tdap 03/06/2013    TDAP status: Up to date  Flu Vaccine status: Due, Education has been provided regarding the importance of this vaccine. Advised may receive this vaccine at local pharmacy or Health Dept. Aware to provide a copy of the vaccination record if obtained from local pharmacy or Health Dept. Verbalized acceptance and understanding.  Pneumococcal vaccine status: Up to date  Covid-19 vaccine status: Information provided on how to obtain vaccines.   Qualifies for Shingles Vaccine? Yes   Zostavax completed No   Shingrix Completed?: No.    Education has been provided regarding the importance of this vaccine. Patient has been advised to call insurance company to determine out of pocket expense if they have not yet received this vaccine. Advised may also receive vaccine at local pharmacy or Health Dept. Verbalized acceptance and understanding.  Screening Tests Health Maintenance  Topic Date Due   Zoster Vaccines- Shingrix (1 of 2) Never done   FOOT EXAM  08/26/2022   HEMOGLOBIN A1C  12/05/2022   COVID-19 Vaccine (4 - 2023-24 season) 12/31/2022   INFLUENZA VACCINE  07/30/2023 (Originally 11/30/2022)   Diabetic kidney evaluation - Urine ACR  03/03/2023   DTaP/Tdap/Td (2 - Td or Tdap) 03/07/2023   OPHTHALMOLOGY EXAM  03/22/2023   Diabetic kidney evaluation - eGFR measurement  06/07/2023   Medicare Annual Wellness (AWV)  01/16/2024   Colonoscopy  06/01/2028   Pneumonia Vaccine 76+ Years old  Completed   Hepatitis C Screening  Completed   HPV VACCINES  Aged Out    Health Maintenance  Health Maintenance Due  Topic Date Due   Zoster Vaccines- Shingrix (1 of 2) Never done   FOOT EXAM  08/26/2022   HEMOGLOBIN A1C  12/05/2022   COVID-19 Vaccine (4 - 2023-24 season) 12/31/2022    Colorectal cancer screening: Type of screening: Colonoscopy. Completed 06/01/21. Repeat every 7 years  LAdditional Screening:  Hepatitis C Screening:  Completed  11/08/18  Vision Screening: Recommended annual ophthalmology exams for early detection of glaucoma and other disorders of the eye. Is the patient up to date with their annual eye exam?  Yes  Who is the provider or what is the name of the office in which the patient attends annual eye exams? [facility]  If pt is not established with a provider, would they like to be referred to a provider to establish care? No .   Dental Screening: Recommended annual dental exams for proper oral hygiene  Diabetic Foot Exam: Diabetic Foot Exam: Overdue, Pt has been advised about the importance in completing this exam. Pt is scheduled  for diabetic foot exam on next appt .  Community Resource Referral / Chronic Care Management: CRR required this visit?  No   CCM required this visit?  No     Plan:     I have personally reviewed and noted the following in the patient's chart:   Medical and social history Use of alcohol, tobacco or illicit drugs  Current medications and supplements including opioid prescriptions. Patient is not currently taking opioid prescriptions. Functional ability and status Nutritional status Physical activity Advanced directives List of other physicians Hospitalizations, surgeries, and ER visits in previous 12 months Vitals Screenings to include cognitive, depression, and falls Referrals and appointments  In addition, I have reviewed and discussed with patient certain preventive protocols, quality metrics, and best practice recommendations. A written personalized care plan for preventive services as well as general preventive health recommendations were provided to patient.     Marzella Schlein, LPN   [id number]/[id number]   After Visit Summary: (MyChart) Due to this being a telephonic visit, the after visit summary with patients personalized plan was offered to patient via MyChart   Nurse Notes: Pt stated he wanted to know if he was due for labs stating the last  time was in 06/2022

## 2023-01-29 DIAGNOSIS — M25521 Pain in right elbow: Secondary | ICD-10-CM | POA: Diagnosis not present

## 2023-01-29 DIAGNOSIS — Z789 Other specified health status: Secondary | ICD-10-CM | POA: Diagnosis not present

## 2023-02-12 DIAGNOSIS — Z789 Other specified health status: Secondary | ICD-10-CM | POA: Diagnosis not present

## 2023-02-12 DIAGNOSIS — M25521 Pain in right elbow: Secondary | ICD-10-CM | POA: Diagnosis not present

## 2023-02-14 ENCOUNTER — Other Ambulatory Visit: Payer: Self-pay | Admitting: Family Medicine

## 2023-02-19 DIAGNOSIS — M7711 Lateral epicondylitis, right elbow: Secondary | ICD-10-CM | POA: Diagnosis not present

## 2023-02-19 DIAGNOSIS — Z789 Other specified health status: Secondary | ICD-10-CM | POA: Diagnosis not present

## 2023-02-19 DIAGNOSIS — M25521 Pain in right elbow: Secondary | ICD-10-CM | POA: Diagnosis not present

## 2023-02-28 ENCOUNTER — Encounter: Payer: Self-pay | Admitting: Family Medicine

## 2023-04-13 ENCOUNTER — Encounter: Payer: Self-pay | Admitting: Family Medicine

## 2023-04-13 ENCOUNTER — Ambulatory Visit (INDEPENDENT_AMBULATORY_CARE_PROVIDER_SITE_OTHER): Payer: Medicare Other | Admitting: Family Medicine

## 2023-04-13 VITALS — BP 136/88 | HR 80 | Temp 98.0°F | Ht 71.0 in | Wt 262.0 lb

## 2023-04-13 DIAGNOSIS — I10 Essential (primary) hypertension: Secondary | ICD-10-CM

## 2023-04-13 DIAGNOSIS — E119 Type 2 diabetes mellitus without complications: Secondary | ICD-10-CM

## 2023-04-13 DIAGNOSIS — E785 Hyperlipidemia, unspecified: Secondary | ICD-10-CM

## 2023-04-13 LAB — COMPREHENSIVE METABOLIC PANEL
ALT: 31 U/L (ref 0–53)
AST: 27 U/L (ref 0–37)
Albumin: 4.3 g/dL (ref 3.5–5.2)
Alkaline Phosphatase: 75 U/L (ref 39–117)
BUN: 19 mg/dL (ref 6–23)
CO2: 24 meq/L (ref 19–32)
Calcium: 9 mg/dL (ref 8.4–10.5)
Chloride: 101 meq/L (ref 96–112)
Creatinine, Ser: 1.17 mg/dL (ref 0.40–1.50)
GFR: 62.98 mL/min (ref >60.00)
Glucose, Bld: 163 mg/dL — ABNORMAL HIGH (ref 70–99)
Potassium: 4.2 meq/L (ref 3.5–5.1)
Sodium: 134 meq/L — ABNORMAL LOW (ref 135–145)
Total Bilirubin: 0.5 mg/dL (ref 0.2–1.2)
Total Protein: 7.2 g/dL (ref 6.0–8.3)

## 2023-04-13 LAB — CBC WITH DIFFERENTIAL/PLATELET
Basophils Absolute: 0.1 10*3/uL (ref 0.0–0.1)
Basophils Relative: 1.5 % (ref 0.0–3.0)
Eosinophils Absolute: 0.3 10*3/uL (ref 0.0–0.7)
Eosinophils Relative: 4.8 % (ref 0.0–5.0)
HCT: 39.8 % (ref 39.0–52.0)
Hemoglobin: 13.6 g/dL (ref 13.0–17.0)
Lymphocytes Relative: 40 % (ref 12.0–46.0)
Lymphs Abs: 2.3 10*3/uL (ref 0.7–4.0)
MCHC: 34.3 g/dL (ref 30.0–36.0)
MCV: 84.4 fL (ref 78.0–100.0)
Monocytes Absolute: 0.6 10*3/uL (ref 0.1–1.0)
Monocytes Relative: 9.8 % (ref 3.0–12.0)
Neutro Abs: 2.6 10*3/uL (ref 1.4–7.7)
Neutrophils Relative %: 43.9 % (ref 43.0–77.0)
Platelets: 158 10*3/uL (ref 150.0–400.0)
RBC: 4.71 Mil/uL (ref 4.22–5.81)
RDW: 15.8 % — ABNORMAL HIGH (ref 11.5–15.5)
WBC: 5.8 10*3/uL (ref 4.0–10.5)

## 2023-04-13 LAB — LIPID PANEL
Cholesterol: 158 mg/dL (ref 0–200)
HDL: 28.2 mg/dL — ABNORMAL LOW (ref >39.00)
Total CHOL/HDL Ratio: 6
Triglycerides: 490 mg/dL — ABNORMAL HIGH (ref 0.0–149.0)

## 2023-04-13 LAB — LDL CHOLESTEROL, DIRECT: Direct LDL: 92 mg/dL

## 2023-04-13 LAB — POCT GLYCOSYLATED HEMOGLOBIN (HGB A1C): Hemoglobin A1C: 7.3 % — AB (ref 4.0–5.6)

## 2023-04-13 MED ORDER — ROSUVASTATIN CALCIUM 5 MG PO TABS: 5.0000 mg | ORAL_TABLET | ORAL | 3 refills | Status: DC

## 2023-04-13 NOTE — Progress Notes (Signed)
Phone [phone] In person visit   Subjective:   Dillon [name] is a 72 y.o. year old very pleasant male patient who presents for/with See problem oriented charting Chief Complaint  Patient presents with   Diabetes    A1c 7.3, DEE requested.   Hypertension   Hyperlipidemia   Elevated PSA    Past Medical History-  Patient Active Problem List   Diagnosis Date Noted   Diabetes mellitus without complication (HCC) 09/05/2017    Priority: High   Morbid obesity (HCC) 05/09/2016    Priority: High   Hyperlipidemia, unspecified 05/06/2018    Priority: Medium    GERD (gastroesophageal reflux disease) 05/06/2018    Priority: Medium    Former smoker 05/06/2018    Priority: Medium    OSA (obstructive sleep apnea) 11/30/2015    Priority: Medium    Elevated PSA 08/25/2015    Priority: Medium    Mild stage glaucoma(365.71) 02/12/2011    Priority: Medium    Venous tributary (branch) occlusion of retina 02/12/2011    Priority: Medium    Essential hypertension 05/16/2007    Priority: Medium    Genetic testing 02/02/2020    Priority: Low   Family history of prostate cancer     Priority: Low   Family history of bladder cancer     Priority: Low   Family history of genetic disease 11/26/2019    Priority: Low   Insomnia 05/06/2018    Priority: Low   ERECTILE DYSFUNCTION 05/07/2008    Priority: Low    Medications- reviewed and updated Current Outpatient Medications  Medication Sig Dispense Refill   aspirin 81 MG tablet Take 81 mg by mouth 2 (two) times a week.     latanoprost (XALATAN) 0.005 % ophthalmic solution Place 1 drop into both eyes at bedtime.     losartan (COZAAR) 100 MG tablet TAKE ONE TABLET BY MOUTH DAILY 90 tablet 3   meloxicam (MOBIC) 15 MG tablet Take 7.5 mg by mouth daily. As needed     omeprazole (PRILOSEC) 20 MG capsule TAKE 1 CAPSULE BY MOUTH DAILY 90 capsule 3   Sildenafil Citrate (VIAGRA PO) Take by mouth. As needed     traZODone (DESYREL) 50 MG tablet  TAKE 1/2 TO 1 TABLET BY MOUTH EVERY NIGHT AT BEDTIME AS NEEDED FOR SLEEP 30 tablet 3   rosuvastatin (CRESTOR) 5 MG tablet Take 1 tablet (5 mg total) by mouth once a week. 13 tablet 3   No current facility-administered medications for this visit.     Objective:  BP 136/88   Pulse 80   Temp 98 F (36.7 C)   Ht 5\' 11"  (1.803 m)   Wt 262 lb (118.8 kg)   SpO2 96%   BMI 36.54 kg/m  Gen: NAD, resting comfortably CV: RRR no murmurs rubs or gallops Lungs: CTAB no crackles, wheeze, rhonchi Ext: no edema  Diabetic Foot Exam - Simple   Simple Foot Form Diabetic Foot exam was performed with the following findings: Yes 04/13/2023 11:38 AM  Visual Inspection No deformities, no ulcerations, no other skin breakdown bilaterally: Yes Sensation Testing Intact to touch and monofilament testing bilaterally: Yes Pulse Check Posterior Tibialis and Dorsalis pulse intact bilaterally: Yes Comments         Assessment and Plan   #social update- enjoying cut back level of work still  # Diabetes- New january 2021 S: Medication:diet controlled in past CBGs- doesn't  check sugars but may do home a1c to see if trending down Exercise  and diet- tooth pulled last week and has been doing more sweets and hearty meals to help with the amoxicillin . Weight stable over last year -not exercising but active- encouraged exercise- som walking but feels could do twice a day Lab Results  Component Value Date   HGBA1C 7.3 (A) 04/13/2023   HGBA1C 7.0 (H) 06/06/2022   HGBA1C 6.8 (H) 03/02/2022  A/P: diabetes mild poorly controlled- he wants to work on diet/exercise to get this back down  #hypertension S: medication:  losartan 100mg  Home readings #s: 130s/80s   BP Readings from Last 3 Encounters:  04/13/23 136/88  01/16/23 130/82  02/28/22 130/80  A/P: high acceptable readings today - but no medications yet today- will go home and take  #hyperlipidemia S: Medication:Rosuvastatin 5 mg once a week - needs  new rx Lab Results  Component Value Date   CHOL 181 03/02/2022   HDL 33.40 (L) 03/02/2022   LDLCALC 85 10/18/2020   LDLDIRECT 88.0 06/06/2022   TRIG 306.0 (H) 03/02/2022   CHOLHDL 5 03/02/2022   A/P: hopefully stable- update a1c today. Continue current meds for now . Work on lifestyle to get triglycerides down  #Insomnia-primarily uses trazodone but has Ambien available if needed (in past but not needing much) doesn't need refill- sleep better  #Erectile dysfunction-Viagra as needed through urology   #morbid obesity with BMI over 35 and hypertension and hyperlipidemia - work on lifestyle changes to bring this down  #elevated PSA_ follows with [facility] urology with Dr. Logan Bores in past- also had 1.8 reading with [facility] recently  Lab Results  Component Value Date   PSA 1.56 03/02/2022   PSA 1.83 10/18/2020   PSA 1.99 09/04/2017   Recommended follow up: Return for next already scheduled visit or sooner if needed. Future Appointments  Date Time Provider Department Center  08/21/2023  8:20 AM Shelva Majestic, MD [facility]  01/22/2024  7:45 AM [facility] ANNUAL WELLNESS VISIT 1 [facility]   Lab/Order associations:   ICD-10-CM   1. Diabetes mellitus without complication (HCC)  E11.9 Urine Microalbumin w/creat. ratio    Comprehensive metabolic panel    CBC with Differential/Platelet    Lipid panel    POCT HgB A1C    2. Essential hypertension  I10     3. Hyperlipidemia, unspecified hyperlipidemia type  E78.5       Meds ordered this encounter  Medications   rosuvastatin (CRESTOR) 5 MG tablet    Sig: Take 1 tablet (5 mg total) by mouth once a week.    Dispense:  13 tablet    Refill:  3    Return precautions advised.  Tana Conch, MD

## 2023-04-13 NOTE — Patient Instructions (Addendum)
Please check with your pharmacy to see if they have the shingrix vaccine. If they do- please get this immunization and update [place] by phone call or mychart with dates you receive the vaccine  Relion a1c self test if want to do at home  Lets work on regular exercise, healthier eating and mild weight loss  Get [place] a copy of diabetic eye exam  Please stop by lab before you go If you have mychart- we will send your results within 3 business days of [place] receiving them.  If you do not have mychart- we will call you about results within 5 business days of [place] receiving them.  *please also note that you will see labs on mychart as soon as they post. I will later go in and write notes on them- will say "notes from Dr. Durene Cal"   Recommended follow up: Return for next already scheduled visit or sooner if needed.

## 2023-04-16 LAB — MICROALBUMIN / CREATININE URINE RATIO
Creatinine,U: 154.4 mg/dL
Microalb Creat Ratio: 4 mg/g (ref 0.0–30.0)
Microalb, Ur: 6.1 mg/dL — ABNORMAL HIGH (ref 0.0–1.9)

## 2023-04-16 NOTE — Addendum Note (Signed)
Addended by: Lorn Junes on: 04/16/2023 12:22 PM   Modules accepted: Orders

## 2023-04-27 DIAGNOSIS — M7711 Lateral epicondylitis, right elbow: Secondary | ICD-10-CM | POA: Diagnosis not present

## 2023-05-22 DIAGNOSIS — N529 Male erectile dysfunction, unspecified: Secondary | ICD-10-CM | POA: Diagnosis not present

## 2023-05-22 DIAGNOSIS — N471 Phimosis: Secondary | ICD-10-CM | POA: Diagnosis not present

## 2023-05-22 DIAGNOSIS — R972 Elevated prostate specific antigen [PSA]: Secondary | ICD-10-CM | POA: Diagnosis not present

## 2023-06-14 DIAGNOSIS — R972 Elevated prostate specific antigen [PSA]: Secondary | ICD-10-CM | POA: Diagnosis not present

## 2023-07-20 DIAGNOSIS — G4733 Obstructive sleep apnea (adult) (pediatric): Secondary | ICD-10-CM | POA: Diagnosis not present

## 2023-07-20 DIAGNOSIS — N471 Phimosis: Secondary | ICD-10-CM | POA: Diagnosis not present

## 2023-07-20 DIAGNOSIS — H348312 Tributary (branch) retinal vein occlusion, right eye, stable: Secondary | ICD-10-CM | POA: Diagnosis not present

## 2023-07-20 DIAGNOSIS — Z7982 Long term (current) use of aspirin: Secondary | ICD-10-CM | POA: Diagnosis not present

## 2023-07-20 DIAGNOSIS — I10 Essential (primary) hypertension: Secondary | ICD-10-CM | POA: Diagnosis not present

## 2023-07-20 DIAGNOSIS — K219 Gastro-esophageal reflux disease without esophagitis: Secondary | ICD-10-CM | POA: Diagnosis not present

## 2023-07-20 DIAGNOSIS — Z79899 Other long term (current) drug therapy: Secondary | ICD-10-CM | POA: Diagnosis not present

## 2023-07-20 DIAGNOSIS — M7711 Lateral epicondylitis, right elbow: Secondary | ICD-10-CM | POA: Diagnosis not present

## 2023-07-20 DIAGNOSIS — N475 Adhesions of prepuce and glans penis: Secondary | ICD-10-CM | POA: Diagnosis not present

## 2023-07-30 DIAGNOSIS — Z5189 Encounter for other specified aftercare: Secondary | ICD-10-CM | POA: Diagnosis not present

## 2023-08-09 DIAGNOSIS — N529 Male erectile dysfunction, unspecified: Secondary | ICD-10-CM | POA: Diagnosis not present

## 2023-08-09 DIAGNOSIS — N471 Phimosis: Secondary | ICD-10-CM | POA: Diagnosis not present

## 2023-08-21 ENCOUNTER — Encounter: Payer: Medicare Other | Admitting: Family Medicine

## 2023-08-27 ENCOUNTER — Encounter: Payer: Self-pay | Admitting: Family Medicine

## 2023-08-27 ENCOUNTER — Ambulatory Visit (INDEPENDENT_AMBULATORY_CARE_PROVIDER_SITE_OTHER): Admitting: Family Medicine

## 2023-08-27 VITALS — BP 120/82 | HR 72 | Temp 98.1°F | Ht 71.0 in | Wt 248.0 lb

## 2023-08-27 DIAGNOSIS — E119 Type 2 diabetes mellitus without complications: Secondary | ICD-10-CM | POA: Diagnosis not present

## 2023-08-27 DIAGNOSIS — E785 Hyperlipidemia, unspecified: Secondary | ICD-10-CM

## 2023-08-27 DIAGNOSIS — I10 Essential (primary) hypertension: Secondary | ICD-10-CM | POA: Diagnosis not present

## 2023-08-27 NOTE — Patient Instructions (Signed)
 Schedule a lab visit at the check out desk within 2 weeks. Return for future fasting labs meaning nothing but water after midnight please. Ok to take your medications with water.   Due for Tetanus, Diphtheria, and Pertussis (Tdap) and shingles shot at pharmacy and let us  know  Recommended follow up: Return in about 6 months (around 02/26/2024) for followup or sooner if needed.Schedule b4 you leave.

## 2023-08-27 NOTE — Progress Notes (Signed)
 Phone [phone] In person visit   Subjective:   Dillon [name] is a 73 y.o. year old very pleasant male patient who presents for/with See problem oriented charting Chief Complaint  Patient presents with   Follow-up    No concerns today, 1 year follow up    Past Medical History-  Patient Active Problem List   Diagnosis Date Noted   Diabetes mellitus without complication (HCC) 09/05/2017    Priority: High   Morbid obesity (HCC) 05/09/2016    Priority: High   Hyperlipidemia, unspecified 05/06/2018    Priority: Medium    GERD (gastroesophageal reflux disease) 05/06/2018    Priority: Medium    Former smoker 05/06/2018    Priority: Medium    OSA (obstructive sleep apnea) 11/30/2015    Priority: Medium    Elevated PSA 08/25/2015    Priority: Medium    Mild stage glaucoma(365.71) 02/12/2011    Priority: Medium    Venous tributary (branch) occlusion of retina 02/12/2011    Priority: Medium    Essential hypertension 05/16/2007    Priority: Medium    Genetic testing 02/02/2020    Priority: Low   Family history of prostate cancer     Priority: Low   Family history of bladder cancer     Priority: Low   Family history of genetic disease 11/26/2019    Priority: Low   Insomnia 05/06/2018    Priority: Low   ERECTILE DYSFUNCTION 05/07/2008    Priority: Low    Medications- reviewed and updated Current Outpatient Medications  Medication Sig Dispense Refill   aspirin 81 MG tablet Take 81 mg by mouth 2 (two) times a week.     latanoprost (XALATAN) 0.005 % ophthalmic solution Place 1 drop into both eyes at bedtime.     losartan  (COZAAR ) 100 MG tablet TAKE ONE TABLET BY MOUTH DAILY 90 tablet 3   meloxicam (MOBIC) 15 MG tablet Take 7.5 mg by mouth daily. As needed     omeprazole  (PRILOSEC) 20 MG capsule TAKE 1 CAPSULE BY MOUTH DAILY 90 capsule 3   rosuvastatin  (CRESTOR ) 5 MG tablet Take 1 tablet (5 mg total) by mouth once a week. 13 tablet 3   Sildenafil  Citrate (VIAGRA  PO) Take  by mouth. As needed     traZODone  (DESYREL ) 50 MG tablet TAKE 1/2 TO 1 TABLET BY MOUTH EVERY NIGHT AT BEDTIME AS NEEDED FOR SLEEP 30 tablet 3   No current facility-administered medications for this visit.     Objective:  BP 120/82 (BP Location: Left Arm, Patient Position: Sitting)   Pulse 72   Temp 98.1 F (36.7 C) (Temporal)   Ht 5\' 11"  (1.803 m)   Wt 248 lb (112.5 kg)   SpO2 97%   BMI 34.59 kg/m  Gen: NAD, resting comfortably CV: RRR no murmurs rubs or gallops Lungs: CTAB no crackles, wheeze, rhonchi Ext: no edema Skin: warm, dry     Assessment and Plan   # Health maintenance-plans to get final Shingrix soon  -back to seeing ex- wife  #Right lateral hip pain and some knee pain- got an opinion from [facility] Dr. Charol Copas and was given some exercises. Often hurts sometime after being very active.  -mentioned 2nd opinion through [facility] - he is going to think this over and may see [facility]- has had very good experience with them  #Phimosis- ended up having circumcision with Dr. Luster Salters July 20 2023 -pain was not as bad as he thought it would be  and on the tail end of healing  # Diabetes- New january 2021 S: Medication: none, diet controlled -sugar down to 120 this morning- had been as high as 220 before changes Lab Results  Component Value Date   HGBA1C 7.3 (A) 04/13/2023   HGBA1C 7.0 (H) 06/06/2022   HGBA1C 6.8 (H) 03/02/2022  Exercise and diet- down 14 lbs from last visit by cutting back on food. Starting to pick walking back up getting further out from surgery. Wants to get to 220.  A/P: diabetes hopefully improved with 14 lbs weight loss and improved eating habits and now getting moving more getting further out from surgery  I discussed the microalbumin to creatinine lab error with patient that occurred with [place] labs.  Essentially the ratio was off by a factor of 10.  In this patient's individual case his levels have improved  corrected from 50 to 40-option would be to add Jardiance to give further protection but he wants to hold off on this for now unless worsens significantly-perhaps improved with A1c improvement  #hypertension S: medication:  losartan  100mg  BP Readings from Last 3 Encounters:  08/27/23 120/82  04/13/23 136/88  01/16/23 130/82  A/P: blood pressure further improved with weight loss- continue current medications   #hyperlipidemia S: Medication:Rosuvastatin  5 mg once a week, 81 mg aspirin every other day Lab Results  Component Value Date   CHOL 158 04/13/2023   HDL 28.20 (L) 04/13/2023   LDLCALC 85 10/18/2020   LDLDIRECT 92.0 04/13/2023   TRIG (H) 04/13/2023    490.0 Triglyceride is over 400; calculations on Lipids are invalid.   CHOLHDL 6 04/13/2023  A/P: hopefully improved with weight loss- is going to come back fasting for triglyceride(s) in particular and to see if LDL any better- been consistent with once weekly statin  #Prostate cancer screening - just had PSA done on 06/14/23 and was 2.34 with Dr. Luster Salters- low risk prior trend Lab Results  Component Value Date   PSA 1.56 03/02/2022   PSA 1.83 10/18/2020   PSA 1.99 09/04/2017   Recommended follow up: Return in about 6 months (around 02/26/2024) for followup or sooner if needed.Schedule b4 you leave. Future Appointments  Date Time Provider Department Center  01/22/2024  7:45 AM [facility] ANNUAL WELLNESS VISIT 1 [facility]    Lab/Order associations:   ICD-10-CM   1. Diabetes mellitus without complication (HCC)  E11.9 Comprehensive metabolic panel with GFR    CBC with Differential/Platelet    Hemoglobin A1c    Lipid panel    Microalbumin / creatinine urine ratio    2. Essential hypertension  I10     3. Hyperlipidemia, unspecified hyperlipidemia type  E78.5       No orders of the defined types were placed in this encounter.   Return precautions advised.  Clarisa Crooked, MD

## 2023-09-07 ENCOUNTER — Other Ambulatory Visit: Payer: Self-pay | Admitting: Family Medicine

## 2023-09-12 ENCOUNTER — Other Ambulatory Visit: Payer: Self-pay | Admitting: Family Medicine

## 2023-09-12 NOTE — Telephone Encounter (Signed)
 Medication: Trazodone  Directions:  TAKE 1/2 TO 1 TABLET BY MOUTH EVERY NIGHT AT BEDTIME AS NEEDED FOR SLEEP Last given: 08/25/2021 Number refills: 3 Last o/v: 08/27/2023 Follow up: N/A Labs:  04/13/2023

## 2023-10-04 ENCOUNTER — Other Ambulatory Visit (INDEPENDENT_AMBULATORY_CARE_PROVIDER_SITE_OTHER)

## 2023-10-04 ENCOUNTER — Ambulatory Visit: Payer: Self-pay | Admitting: Family Medicine

## 2023-10-04 DIAGNOSIS — E119 Type 2 diabetes mellitus without complications: Secondary | ICD-10-CM

## 2023-10-04 LAB — CBC WITH DIFFERENTIAL/PLATELET
Basophils Absolute: 0.1 10*3/uL (ref 0.0–0.1)
Basophils Relative: 2.1 % (ref 0.0–3.0)
Eosinophils Absolute: 0.3 10*3/uL (ref 0.0–0.7)
Eosinophils Relative: 6.1 % — ABNORMAL HIGH (ref 0.0–5.0)
HCT: 39.1 % (ref 39.0–52.0)
Hemoglobin: 13.1 g/dL (ref 13.0–17.0)
Lymphocytes Relative: 44.7 % (ref 12.0–46.0)
Lymphs Abs: 1.9 10*3/uL (ref 0.7–4.0)
MCHC: 33.6 g/dL (ref 30.0–36.0)
MCV: 84.5 fl (ref 78.0–100.0)
Monocytes Absolute: 0.5 10*3/uL (ref 0.1–1.0)
Monocytes Relative: 12.1 % — ABNORMAL HIGH (ref 3.0–12.0)
Neutro Abs: 1.5 10*3/uL (ref 1.4–7.7)
Neutrophils Relative %: 35 % — ABNORMAL LOW (ref 43.0–77.0)
Platelets: 152 10*3/uL (ref 150.0–400.0)
RBC: 4.63 Mil/uL (ref 4.22–5.81)
RDW: 15.3 % (ref 11.5–15.5)
WBC: 4.3 10*3/uL (ref 4.0–10.5)

## 2023-10-04 LAB — HEMOGLOBIN A1C: Hgb A1c MFr Bld: 7.3 % — ABNORMAL HIGH (ref 4.6–6.5)

## 2023-10-04 LAB — MICROALBUMIN / CREATININE URINE RATIO
Creatinine,U: 231.8 mg/dL
Microalb Creat Ratio: 20.4 mg/g (ref 0.0–30.0)
Microalb, Ur: 4.7 mg/dL — ABNORMAL HIGH (ref 0.0–1.9)

## 2023-10-05 LAB — LIPID PANEL
Cholesterol: 172 mg/dL (ref 0–200)
HDL: 32.4 mg/dL — ABNORMAL LOW (ref >39.00)
LDL Cholesterol: 100 mg/dL — ABNORMAL HIGH (ref 0–99)
NonHDL: 139.7
Total CHOL/HDL Ratio: 5
Triglycerides: 200 mg/dL — ABNORMAL HIGH (ref 0.0–149.0)
VLDL: 40 mg/dL (ref 0.0–40.0)

## 2023-10-05 LAB — COMPREHENSIVE METABOLIC PANEL WITH GFR
ALT: 24 U/L (ref 0–53)
AST: 26 U/L (ref 0–37)
Albumin: 4.6 g/dL (ref 3.5–5.2)
Alkaline Phosphatase: 55 U/L (ref 39–117)
BUN: 26 mg/dL — ABNORMAL HIGH (ref 6–23)
CO2: 22 meq/L (ref 19–32)
Calcium: 9.8 mg/dL (ref 8.4–10.5)
Chloride: 102 meq/L (ref 96–112)
Creatinine, Ser: 1.2 mg/dL (ref 0.40–1.50)
GFR: 60.89 mL/min (ref >60.00)
Glucose, Bld: 124 mg/dL — ABNORMAL HIGH (ref 70–99)
Potassium: 4.2 meq/L (ref 3.5–5.1)
Sodium: 134 meq/L — ABNORMAL LOW (ref 135–145)
Total Bilirubin: 0.7 mg/dL (ref 0.2–1.2)
Total Protein: 7.5 g/dL (ref 6.0–8.3)

## 2023-10-25 ENCOUNTER — Other Ambulatory Visit: Payer: Self-pay

## 2023-10-25 MED ORDER — ROSUVASTATIN CALCIUM 5 MG PO TABS: 5.0000 mg | ORAL_TABLET | ORAL | 3 refills | Status: AC

## 2023-11-23 DIAGNOSIS — H31011 Macula scars of posterior pole (postinflammatory) (post-traumatic), right eye: Secondary | ICD-10-CM | POA: Diagnosis not present

## 2023-11-23 DIAGNOSIS — H401131 Primary open-angle glaucoma, bilateral, mild stage: Secondary | ICD-10-CM | POA: Diagnosis not present

## 2023-11-23 DIAGNOSIS — H2513 Age-related nuclear cataract, bilateral: Secondary | ICD-10-CM | POA: Diagnosis not present

## 2023-12-17 DIAGNOSIS — L821 Other seborrheic keratosis: Secondary | ICD-10-CM | POA: Diagnosis not present

## 2023-12-17 DIAGNOSIS — L814 Other melanin hyperpigmentation: Secondary | ICD-10-CM | POA: Diagnosis not present

## 2023-12-17 DIAGNOSIS — D1801 Hemangioma of skin and subcutaneous tissue: Secondary | ICD-10-CM | POA: Diagnosis not present

## 2024-01-22 ENCOUNTER — Ambulatory Visit (INDEPENDENT_AMBULATORY_CARE_PROVIDER_SITE_OTHER): Payer: Medicare Other

## 2024-01-22 VITALS — BP 120/70 | HR 102 | Temp 98.4°F | Ht 72.0 in | Wt 256.6 lb

## 2024-01-22 DIAGNOSIS — Z0000 Encounter for general adult medical examination without abnormal findings: Secondary | ICD-10-CM

## 2024-01-22 NOTE — Progress Notes (Signed)
 Subjective:   Dillon [name] is a 73 y.o. who presents for a Medicare Wellness preventive visit.  As a reminder, Annual Wellness Visits don't include a physical exam, and some assessments may be limited, especially if this visit is performed virtually. We may recommend an in-person follow-up visit with your provider if needed.  Visit Complete: In person    Persons Participating in Visit: Patient.  AWV Questionnaire: Yes: Patient Medicare AWV questionnaire was completed by the patient on 01/21/24; I have confirmed that all information answered by patient is correct and no changes since this date.  Cardiac Risk Factors include: advanced age (>51men, >30 women);diabetes mellitus;dyslipidemia;hypertension;male gender;obesity (BMI >30kg/m2)     Objective:    Today's Vitals   01/22/24 0756 01/22/24 0801  BP: 120/70   Pulse: (!) 102 (!) 102  Temp: 98.4 F (36.9 C)   SpO2: 96%   Weight: 256 lb 9.6 oz (116.4 kg)   Height: 6' (1.829 m)    Body mass index is 34.8 kg/m.     01/22/2024    7:59 AM 01/16/2023    8:05 AM 11/25/2021    8:06 AM 12/23/2020    9:40 AM 11/18/2020    8:13 AM 11/13/2019    8:08 AM 04/28/2018    5:21 PM  Advanced Directives  Does Patient Have a Medical Advance Directive? No No No No Yes Yes No   Type of Advance Directive     Healthcare Power of Attorney    Does patient want to make changes to medical advance directive?      Yes (MAU/Ambulatory/Procedural Areas - Information given)   Copy of Healthcare Power of Attorney in Chart?     No - copy requested    Would patient like information on creating a medical advance directive? No - Patient declined No - Patient declined Yes (MAU/Ambulatory/Procedural Areas - Information given)         Data saved with a previous flowsheet row definition    Current Medications (verified) Outpatient Encounter Medications as of 01/22/2024  Medication Sig   aspirin 81 MG tablet Take 81 mg by mouth 2 (two) times a week.    latanoprost (XALATAN) 0.005 % ophthalmic solution Place 1 drop into both eyes at bedtime.   losartan  (COZAAR ) 100 MG tablet TAKE 1 TABLET BY MOUTH DAILY   omeprazole  (PRILOSEC) 20 MG capsule TAKE 1 CAPSULE BY MOUTH DAILY   rosuvastatin  (CRESTOR ) 5 MG tablet Take 1 tablet (5 mg total) by mouth once a week.   SHINGRIX injection    Sildenafil  Citrate (VIAGRA  PO) Take by mouth. As needed   traZODone  (DESYREL ) 50 MG tablet TAKE ONE HALF TO ONE TABLET BY MOUTH EVERY NIGHT AT BEDTIME AS NEEDED FOR SLEEP   [DISCONTINUED] meloxicam (MOBIC) 15 MG tablet Take 7.5 mg by mouth daily. As needed   No facility-administered encounter medications on file as of 01/22/2024.    Allergies (verified) Patient has no known allergies.   History: Past Medical History:  Diagnosis Date   BCC (basal cell carcinoma of skin)    2008 or 2009 left shoulder   ED (erectile dysfunction)    Elevated liver function tests    2020 labs normal- issues in past    Family history of bladder cancer    Family history of prostate cancer    GERD (gastroesophageal reflux disease)    Hypertension    Past Surgical History:  Procedure Laterality Date   WRIST GANGLION EXCISION     he reports  several cysts in past   Family History  Problem Relation Age of Onset   Stroke Mother        complications after a fall   Heart attack Father        age 85. smoker   Prostate cancer Father    Coronary artery disease Brother        stent. patient 5 years older than patient.    Cancer Brother    Hypertension Brother    Hyperlipidemia Brother    Hypertension Brother    Hypertension Brother    Cancer Brother        small cell lung cancer died at 7   Other Brother        airplane crash   Bladder Cancer Maternal Uncle        dx mid 72s   Prostate cancer Maternal Uncle    Heart defect Son        transposition of arteries   Esophageal cancer Neg Hx    Colon cancer Neg Hx    Stomach cancer Neg Hx    Rectal cancer Neg Hx    Social  History   Socioeconomic History   Marital status: Divorced    Spouse name: Not on file   Number of children: Not on file   Years of education: Not on file   Highest education level: Bachelor's degree (e.g., BA, AB, BS)  Occupational History   Occupation: semi retired  Tobacco Use   Smoking status: Former    Current packs/day: 0.00    Average packs/day: 0.2 packs/day for 2.0 years (0.4 ttl pk-yrs)    Types: Cigarettes    Start date: 05/01/1989    Quit date: 05/02/1991    Years since quitting: 32.7   Smokeless tobacco: Never  Vaping Use   Vaping status: Never Used  Substance and Sexual Activity   Alcohol use: Yes    Alcohol/week: 2.0 standard drinks of alcohol    Comment: 1 to 3 drinks a week   Drug use: No   Sexual activity: Not Currently  Other Topics Concern   Not on file  Social History Narrative   Divorced. Dating some. 3 children from prior marriage. 4 grandsons. No pets.       Self employeed Scientist, research (physical sciences)   Social Drivers of Health   Financial Resource Strain: Low Risk  (01/21/2024)   Overall Financial Resource Strain (CARDIA)    Difficulty of Paying Living Expenses: Not hard at all  Food Insecurity: No Food Insecurity (01/21/2024)   Hunger Vital Sign    Worried About Running Out of Food in the Last Year: Never true    Ran Out of Food in the Last Year: Never true  Transportation Needs: No Transportation Needs (01/21/2024)   PRAPARE - Administrator, Civil Service (Medical): No    Lack of Transportation (Non-Medical): No  Physical Activity: Insufficiently Active (01/21/2024)   Exercise Vital Sign    Days of Exercise per Week: 2 days    Minutes of Exercise per Session: 20 min  Stress: No Stress Concern Present (01/21/2024)   [employer] of Occupational Health - Occupational Stress Questionnaire    Feeling of Stress: Only a little  Social Connections: Moderately Integrated (01/21/2024)   Social Connection and Isolation Panel    Frequency of  Communication with Friends and Family: More than three times a week    Frequency of Social Gatherings with Friends and Family: Three times a week  Attends Religious Services: More than 4 times per year    Active Member of Clubs or Organizations: Yes    Attends Banker Meetings: 1 to 4 times per year    Marital Status: Divorced    Tobacco Counseling Counseling given: Not Answered    Clinical Intake:  Pre-visit preparation completed: Yes  Pain : No/denies pain     BMI - recorded: 34.8 Nutritional Status: BMI > 30  Obese Nutritional Risks: None Diabetes: Yes CBG done?: Yes (135 per pt) CBG resulted in Enter/ Edit results?: No Did pt. bring in CBG monitor from home?: No  Lab Results  Component Value Date   HGBA1C 7.3 (H) 10/04/2023   HGBA1C 7.3 (A) 04/13/2023   HGBA1C 7.0 (H) 06/06/2022     How often do you need to have someone help you when you read instructions, pamphlets, or other written materials from your doctor or pharmacy?: 1 - Never  Interpreter Needed?: No  Information entered by :: Ellouise Haws, LPN   Activities of Daily Living     01/21/2024   10:10 AM  In your present state of health, do you have any difficulty performing the following activities:  Hearing? 0  Vision? 0  Difficulty concentrating or making decisions? 0  Walking or climbing stairs? 0  Dressing or bathing? 0  Doing errands, shopping? 0  Preparing Food and eating ? N  Using the Toilet? N  In the past six months, have you accidently leaked urine? N  Do you have problems with loss of bowel control? N  Managing your Medications? N  Managing your Finances? N  Housekeeping or managing your Housekeeping? N    Patient Care Team: Katrinka Garnette KIDD, MD as PCP - General (Family Medicine) Pandora Cadet, [facility] as Pharmacist (Pharmacist) Livingston Rigg, MD as Consulting Physician (Dermatology)  I have updated your Care Teams any recent Medical Services you may have received  from other providers in the past year.     Assessment:   This is a routine wellness examination for [place].  Hearing/Vision screen Hearing Screening - Comments:: Pt denies any hearing issues  Vision Screening - Comments:: Wears rx glasses - up to date with routine eye exams with [facility] and Dr Carmelita    Goals Addressed             This Visit's Progress    Patient Stated       Weight loss        Depression Screen     01/22/2024    8:00 AM 04/13/2023   10:53 AM 01/16/2023    8:06 AM 11/25/2021    8:05 AM 11/18/2020    8:12 AM 10/18/2020    7:54 AM 06/16/2020    8:26 AM  PHQ 2/9 Scores  PHQ - 2 Score 0 0 0 0 0 0 0    Fall Risk     01/21/2024   10:10 AM 04/13/2023   10:52 AM 01/15/2023    7:50 PM 11/25/2021    8:07 AM 11/18/2020    8:15 AM  Fall Risk   Falls in the past year? 0 0 0 0 0  Number falls in past yr: 0 0 0 0 0  Injury with Fall? 0 0 0 0 0  Risk for fall due to : No Fall Risks No Fall Risks Impaired vision Impaired vision Impaired vision  Follow up Falls prevention discussed Falls evaluation completed Falls prevention discussed Falls prevention discussed  Falls prevention discussed  Data saved with a previous flowsheet row definition    MEDICARE RISK AT HOME:  Medicare Risk at Home Any stairs in or around the home?: (Patient-Rptd) Yes If so, are there any without handrails?: (Patient-Rptd) No Home free of loose throw rugs in walkways, pet beds, electrical cords, etc?: (Patient-Rptd) Yes Adequate lighting in your home to reduce risk of falls?: (Patient-Rptd) Yes Life alert?: (Patient-Rptd) No Use of a cane, walker or w/c?: (Patient-Rptd) No Shower chair or bench in shower?: (Patient-Rptd) No Elevated toilet seat or a handicapped toilet?: (Patient-Rptd) No  TIMED UP AND GO:  Was the test performed?  Yes  Length of time to ambulate 10 feet: 10 sec Gait steady and fast without use of assistive device  Cognitive Function: 6CIT completed         01/22/2024    8:03 AM 01/16/2023    8:08 AM 11/25/2021    8:11 AM 11/18/2020    8:17 AM 11/13/2019    8:16 AM  6CIT Screen  What Year? 0 points 0 points 0 points 0 points 0 points  What month? 0 points 0 points 0 points 0 points 0 points  What time? 0 points 0 points 0 points 0 points 0 points  Count back from 20 0 points 0 points 0 points 0 points 0 points  Months in reverse 0 points 0 points 0 points 0 points 0 points  Repeat phrase 0 points 0 points 0 points 0 points 0 points  Total Score 0 points 0 points 0 points 0 points 0 points    Immunizations Immunization History  Administered Date(s) Administered   Fluad Quad(high Dose 65+) 05/16/2019   PFIZER(Purple Top)SARS-COV-2 Vaccination 06/14/2019, 07/09/2019, 04/09/2020   PNEUMOCOCCAL CONJUGATE-20 05/23/2021   Pneumococcal Conjugate-13 11/26/2019   Tdap 03/06/2013   Zoster Recombinant(Shingrix) 06/26/2023    Screening Tests Health Maintenance  Topic Date Due   DTaP/Tdap/Td (2 - Td or Tdap) 03/07/2023   OPHTHALMOLOGY EXAM  03/22/2023   Zoster Vaccines- Shingrix (2 of 2) 08/21/2023   Influenza Vaccine  11/30/2023   COVID-19 Vaccine (4 - 2025-26 season) 12/31/2023   HEMOGLOBIN A1C  04/04/2024   FOOT EXAM  04/12/2024   Diabetic kidney evaluation - eGFR measurement  10/03/2024   Diabetic kidney evaluation - Urine ACR  10/03/2024   Medicare Annual Wellness (AWV)  01/21/2025   Colonoscopy  06/01/2028   Pneumococcal Vaccine: 50+ Years  Completed   Hepatitis C Screening  Completed   HPV VACCINES  Aged Out   Meningococcal B Vaccine  Aged Out    Health Maintenance Items Addressed: See Nurse Notes at the end of this note  Additional Screening:  Vision Screening: Recommended annual ophthalmology exams for early detection of glaucoma and other disorders of the eye. Is the patient up to date with their annual eye exam?  Yes  Who is the provider or what is the name of the office in which the patient attends annual eye exams?  Dr [facility]elita and [facility]   Dental Screening: Recommended annual dental exams for proper oral hygiene  Community Resource Referral / Chronic Care Management: CRR required this visit?  No   CCM required this visit?  No   Plan:    I have personally reviewed and noted the following in the patient's chart:   Medical and social history Use of alcohol, tobacco or illicit drugs  Current medications and supplements including opioid prescriptions. Patient is not currently taking opioid prescriptions. Functional ability and status Nutritional status Physical activity Advanced  directives List of other physicians Hospitalizations, surgeries, and ER visits in previous 12 months Vitals Screenings to include cognitive, depression, and falls Referrals and appointments  In addition, I have reviewed and discussed with patient certain preventive protocols, quality metrics, and best practice recommendations. A written personalized care plan for preventive services as well as general preventive health recommendations were provided to patient.   Ellouise VEAR Haws, LPN   [id number]   After Visit Summary: (MyChart) Due to this being a telephonic visit, the after visit summary with patients personalized plan was offered to patient via MyChart   Notes: Nothing significant to report at this time. Pt stated he would get flu vaccine in October

## 2024-01-22 NOTE — Patient Instructions (Signed)
 [name],  Thank you for taking the time for your Medicare Wellness Visit. I appreciate your continued commitment to your health goals. Please review the care plan we discussed, and feel free to reach out if I can assist you further.  Medicare recommends these wellness visits once per year to help you and your care team stay ahead of potential health issues. These visits are designed to focus on prevention, allowing your provider to concentrate on managing your acute and chronic conditions during your regular appointments.  Please note that Annual Wellness Visits do not include a physical exam. Some assessments may be limited, especially if the visit was conducted virtually. If needed, we may recommend a separate in-person follow-up with your provider.  Ongoing Care Seeing your primary care provider every 3 to 6 months helps us  monitor your health and provide consistent, personalized care.   Referrals If a referral was made during today's visit and you haven't received any updates within two weeks, please contact the referred provider directly to check on the status.  Recommended Screenings:  Health Maintenance  Topic Date Due   DTaP/Tdap/Td vaccine (2 - Td or Tdap) 03/07/2023   Eye exam for diabetics  03/22/2023   Zoster (Shingles) Vaccine (2 of 2) 08/21/2023   Flu Shot  11/30/2023   COVID-19 Vaccine (4 - 2025-26 season) 12/31/2023   Medicare Annual Wellness Visit  01/16/2024   Hemoglobin A1C  04/04/2024   Complete foot exam   04/12/2024   Yearly kidney function blood test for diabetes  10/03/2024   Yearly kidney health urinalysis for diabetes  10/03/2024   Colon Cancer Screening  06/01/2028   Pneumococcal Vaccine for age over 82  Completed   Hepatitis C Screening  Completed   HPV Vaccine  Aged Out   Meningitis B Vaccine  Aged Out       01/16/2023    8:05 AM  Advanced Directives  Does Patient Have a Medical Advance Directive? No  Would patient like information on creating a  medical advance directive? No - Patient declined   Advance Care Planning is important because it: Ensures you receive medical care that aligns with your values, goals, and preferences. Provides guidance to your family and loved ones, reducing the emotional burden of decision-making during critical moments.  Vision: Annual vision screenings are recommended for early detection of glaucoma, cataracts, and diabetic retinopathy. These exams can also reveal signs of chronic conditions such as diabetes and high blood pressure.  Dental: Annual dental screenings help detect early signs of oral cancer, gum disease, and other conditions linked to overall health, including heart disease and diabetes.  Please see the attached documents for additional preventive care recommendations.

## 2024-03-13 ENCOUNTER — Ambulatory Visit: Payer: Self-pay

## 2024-03-13 NOTE — Telephone Encounter (Signed)
 If he has any symptoms like chest pain or shortness of breath or worsening fatigue or worsening rectal bleeding need to be seen immediately  Team I think we need to work him in at 1120 with me tomorrow so I have been waiting till 4 PM-that way we can at least get blood work back to make a decision before the weekend.  Please work with scheduling to make this happen-please let her know there may be a wait as I have a patient at may have a prolonged visit before him but I feel like to move his visit up

## 2024-03-13 NOTE — Telephone Encounter (Signed)
Please see triage notes and advise

## 2024-03-13 NOTE — Telephone Encounter (Signed)
 FYI Only or Action Required?: FYI only for provider: appointment scheduled on 11/14.  Patient was last seen in primary care on 08/27/2023 by Katrinka Garnette KIDD, MD.  Called Nurse Triage reporting Rectal Bleeding.  Symptoms began several days ago.  Interventions attempted: Nothing.  Symptoms are: unchanged.  Triage Disposition: See Physician Within 24 Hours  Patient/caregiver understands and will follow disposition?: Yes     Copied from CRM [id number]. Topic: Clinical - Red Word Triage >> Mar 13, 2024 10:18 AM Carlyon D wrote: Red Word that prompted transfer to Nurse Triage: bright red blood in the stool every time pt goes to washroom there is blood in stool      Reason for Disposition  MODERATE rectal bleeding (e.g., small blood clots, passing blood without stool, or toilet water turns red)  Answer Assessment - Initial Assessment Questions 1. APPEARANCE of BLOOD: What color is it? Is it passed separately, on the surface of the stool, or mixed in with the stool?      Bright red blood  2. AMOUNT: How much blood was passed?      Toilet bowl turned red  3. FREQUENCY: How many times has blood been passed with the stools?      Intermittent, not with every bowel movement  4. ONSET: When was the blood first seen in the stools? (Days or weeks)      5-6 days  5. DIARRHEA: Is there also some diarrhea? If Yes, ask: How many diarrhea stools in the past 24 hours?      No 6. CONSTIPATION: Do you have constipation? If Yes, ask: How bad is it?     Constipated for the last couple of days  7. RECURRENT SYMPTOMS: Have you had blood in your stools before? If Yes, ask: When was the last time? and What happened that time?      No 8. BLOOD THINNERS: Do you take any blood thinners? (e.g., aspirin, clopidogrel / Plavix, coumadin, heparin). Notes: Other strong blood thinners include: Arixtra (fondaparinux), Eliquis (apixaban), Pradaxa (dabigatran), and Xarelto (rivaroxaban).      No 9. OTHER SYMPTOMS: Do you have any other symptoms?  (e.g., abdomen pain, vomiting, dizziness, fever)     No  Protocols used: Rectal Bleeding-A-AH

## 2024-03-14 ENCOUNTER — Ambulatory Visit: Payer: Self-pay | Admitting: Family Medicine

## 2024-03-14 ENCOUNTER — Ambulatory Visit: Admitting: Family

## 2024-03-14 ENCOUNTER — Ambulatory Visit: Admitting: Family Medicine

## 2024-03-14 VITALS — BP 138/78 | HR 91 | Temp 98.4°F | Resp 16 | Ht 71.0 in | Wt 257.0 lb

## 2024-03-14 DIAGNOSIS — L723 Sebaceous cyst: Secondary | ICD-10-CM | POA: Insufficient documentation

## 2024-03-14 DIAGNOSIS — Z6835 Body mass index (BMI) 35.0-35.9, adult: Secondary | ICD-10-CM

## 2024-03-14 DIAGNOSIS — E119 Type 2 diabetes mellitus without complications: Secondary | ICD-10-CM | POA: Diagnosis not present

## 2024-03-14 DIAGNOSIS — D224 Melanocytic nevi of scalp and neck: Secondary | ICD-10-CM | POA: Insufficient documentation

## 2024-03-14 DIAGNOSIS — K625 Hemorrhage of anus and rectum: Secondary | ICD-10-CM

## 2024-03-14 DIAGNOSIS — E1169 Type 2 diabetes mellitus with other specified complication: Secondary | ICD-10-CM

## 2024-03-14 DIAGNOSIS — L814 Other melanin hyperpigmentation: Secondary | ICD-10-CM | POA: Insufficient documentation

## 2024-03-14 DIAGNOSIS — L821 Other seborrheic keratosis: Secondary | ICD-10-CM | POA: Insufficient documentation

## 2024-03-14 LAB — CBC WITH DIFFERENTIAL/PLATELET
Basophils Absolute: 0.1 K/uL (ref 0.0–0.1)
Basophils Relative: 1.3 % (ref 0.0–3.0)
Eosinophils Absolute: 0.2 K/uL (ref 0.0–0.7)
Eosinophils Relative: 4.2 % (ref 0.0–5.0)
HCT: 41.2 % (ref 39.0–52.0)
Hemoglobin: 13.6 g/dL (ref 13.0–17.0)
Lymphocytes Relative: 37.6 % (ref 12.0–46.0)
Lymphs Abs: 2.2 K/uL (ref 0.7–4.0)
MCHC: 33 g/dL (ref 30.0–36.0)
MCV: 78.8 fl (ref 78.0–100.0)
Monocytes Absolute: 0.7 K/uL (ref 0.1–1.0)
Monocytes Relative: 12 % (ref 3.0–12.0)
Neutro Abs: 2.6 K/uL (ref 1.4–7.7)
Neutrophils Relative %: 44.9 % (ref 43.0–77.0)
Platelets: 180 K/uL (ref 150.0–400.0)
RBC: 5.23 Mil/uL (ref 4.22–5.81)
RDW: 16.1 % — ABNORMAL HIGH (ref 11.5–15.5)
WBC: 5.9 K/uL (ref 4.0–10.5)

## 2024-03-14 LAB — COMPREHENSIVE METABOLIC PANEL WITH GFR
ALT: 36 U/L (ref 0–53)
AST: 31 U/L (ref 0–37)
Albumin: 4.8 g/dL (ref 3.5–5.2)
Alkaline Phosphatase: 65 U/L (ref 39–117)
BUN: 11 mg/dL (ref 6–23)
CO2: 26 meq/L (ref 19–32)
Calcium: 9.9 mg/dL (ref 8.4–10.5)
Chloride: 97 meq/L (ref 96–112)
Creatinine, Ser: 1.2 mg/dL (ref 0.40–1.50)
GFR: 60.7 mL/min (ref >60.00)
Glucose, Bld: 108 mg/dL — ABNORMAL HIGH (ref 70–99)
Potassium: 4.1 meq/L (ref 3.5–5.1)
Sodium: 132 meq/L — ABNORMAL LOW (ref 135–145)
Total Bilirubin: 0.9 mg/dL (ref 0.2–1.2)
Total Protein: 8.1 g/dL (ref 6.0–8.3)

## 2024-03-14 NOTE — Telephone Encounter (Signed)
 Noted

## 2024-03-14 NOTE — Telephone Encounter (Signed)
 Patient states no SOB, etc. Patient scheduled with Dr. Katrinka 11:20 am 03/14/24

## 2024-03-14 NOTE — Progress Notes (Signed)
 Phone [phone] In person visit   Subjective:   Dillon [name] is a 73 y.o. year old very pleasant male patient who presents for/with See problem oriented charting Chief Complaint  Patient presents with   Rectal Bleeding    Started on Monday and has gotten worse. Had a stiff stool Sunday and continued on through Monday. Denies pain.     Past Medical History-  Patient Active Problem List   Diagnosis Date Noted   Type 2 diabetes mellitus with other specified complication, without long-term current use of insulin (HCC) 09/05/2017    Priority: High   Morbid obesity (HCC) 05/09/2016    Priority: High   Hyperlipidemia, unspecified 05/06/2018    Priority: Medium    GERD (gastroesophageal reflux disease) 05/06/2018    Priority: Medium    Former smoker 05/06/2018    Priority: Medium    OSA (obstructive sleep apnea) 11/30/2015    Priority: Medium    Elevated PSA 08/25/2015    Priority: Medium    Mild stage glaucoma(365.71) 02/12/2011    Priority: Medium    Venous tributary (branch) occlusion of retina (HCC) 02/12/2011    Priority: Medium    Essential hypertension 05/16/2007    Priority: Medium    Genetic testing 02/02/2020    Priority: Low   Family history of prostate cancer     Priority: Low   Family history of bladder cancer     Priority: Low   Family history of genetic disease 11/26/2019    Priority: Low   Insomnia 05/06/2018    Priority: Low   ERECTILE DYSFUNCTION 05/07/2008    Priority: Low   Solar lentigo 03/14/2024   Seborrheic keratosis 03/14/2024   Sebaceous cyst 03/14/2024   Nevus of neck 03/14/2024   Primary open angle glaucoma 02/12/2011   Mild stage glaucoma 02/12/2011    Medications- reviewed and updated Current Outpatient Medications  Medication Sig Dispense Refill   latanoprost (XALATAN) 0.005 % ophthalmic solution Place 1 drop into both eyes at bedtime.     losartan  (COZAAR ) 100 MG tablet TAKE 1 TABLET BY MOUTH DAILY 90 tablet 3   omeprazole   (PRILOSEC) 20 MG capsule TAKE 1 CAPSULE BY MOUTH DAILY 90 capsule 3   rosuvastatin  (CRESTOR ) 5 MG tablet Take 1 tablet (5 mg total) by mouth once a week. 13 tablet 3   Sildenafil  Citrate (VIAGRA  PO) Take by mouth. As needed     traZODone  (DESYREL ) 50 MG tablet TAKE ONE HALF TO ONE TABLET BY MOUTH EVERY NIGHT AT BEDTIME AS NEEDED FOR SLEEP 30 tablet 3   aspirin 81 MG tablet Take 81 mg by mouth 2 (two) times a week. (Patient not taking: Reported on 03/14/2024)     No current facility-administered medications for this visit.     Objective:  BP 138/78   Pulse 91   Temp 98.4 F (36.9 C) (Temporal)   Resp 16   Ht 5' 11 (1.803 m)   Wt 257 lb (116.6 kg)   SpO2 98%   BMI 35.84 kg/m  Gen: NAD, resting comfortably No obvious mucous membrane pallor CV: RRR no murmurs rubs or gallops Lungs: CTAB no crackles, wheeze, rhonchi Abdomen: soft/nontender/nondistended/normal bowel sounds. No rebound or guarding.  Ext: no edema Skin: warm, dry     Assessment and Plan   # REctal bleeding S: Symptoms are on Monday and have worsened.  He was rather constipated on Sunday and had to strain- with blood continuing into Monday. Usually pretty regular -No chest pain, shortness  of breath, increased fatigue, dizziness.   - History of anemia from donating blood in the past but has been okay lately.  Up-to-date on colonoscopy as of 06/01/2021 with Dr. Aneita.  At that time noted to have diverticulosis as well as internal hemorrhoids Lab Results  Component Value Date   WBC 4.3 10/04/2023   HGB 13.1 10/04/2023   HCT 39.1 10/04/2023   MCV 84.5 10/04/2023   PLT 152.0 10/04/2023  -hydration may be a little less  On Sunday just streaked the stool and then went again Monday and had fair amount of blood in the toilet bowl. Next bowel movement was not until Wednesday and bloody in toilet bowl again. Less blood on Thursday. Very bloody again today.   He stopped his aspirin on Tuesday. Had been doing ibuprofen at  night but agrees to stop- helps him rest. 3 tablets for 2-3 weeks.  A/P: Rectal bleeding for the last 5 to 6 days associated with constipation and patient with known diverticulosis as well as internal hemorrhoids who is also recently been taking aspirin as well as ibuprofen - Has already stopped aspirin and asked him to stop ibuprofen immediately -Doubt upper GI bleed but already on PPI and he will continue-I did not advance the dose -Soften stool with Colace/docusate and to take MiraLAX as well and increase hydration -No systemic symptoms and if these develop recommend emergency department - Check hemoglobin stat and discuss potential emergency room visit depending on level of drop -Referral to GI-this will likely need to be changed to stat depending on degree of loss - If no hospitalization recheck early next week -He declined rectal exam-denies external bulge   # Diabetes- New january 2021 S: Medication: none, diet controlled  Lab Results  Component Value Date   HGBA1C 7.3 (H) 10/04/2023   HGBA1C 7.3 (A) 04/13/2023   HGBA1C 7.0 (H) 06/06/2022  A/P: Mild poor control of we are tolerating under 7.5 that she prefers to hold off on medicine-update A1c with labs today and continue without medication -Morbid obesity noted with BMI over 35 with hypertension and hyperlipidemia and diabetes-not the acute focus of our visit but noted-if A1c remains high likely focus on healthy eating/regular exercise/weight loss  #hypertension S: medication:  losartan  100mg  A/P: Well-controlled-will continue medication for now  #hyperlipidemia-mild poor control on rosuvastatin  5 mg once a week but he has preferred to minimize dose-continue current medication-not due for full lipid panel yet Lab Results  Component Value Date   CHOL 172 10/04/2023   HDL 32.40 (L) 10/04/2023   LDLCALC 100 (H) 10/04/2023   LDLDIRECT 92.0 04/13/2023   TRIG 200.0 (H) 10/04/2023   CHOLHDL 5 10/04/2023    Recommended follow up:  Return in about 6 months (around 09/11/2024) for followup or sooner if needed.Schedule b4 you leave. Future Appointments  Date Time Provider Department Center  01/26/2025  8:00 AM [facility] ANNUAL WELLNESS VISIT 1 [facility]  01/26/2025  8:40 AM [facility] ANNUAL WELLNESS VISIT 1 [facility]    Lab/Order associations:   ICD-10-CM   1. Rectal bleeding  K62.5 CBC with Differential/Platelet    Ambulatory referral to Gastroenterology    2. Morbid obesity (HCC)  E66.01     3. Type 2 diabetes mellitus with other specified complication, without long-term current use of insulin (HCC)  E11.69 Comprehensive metabolic panel with GFR    Hemoglobin A1c      No orders of the defined types were placed in this encounter.   Return  precautions advised.  Garnette Lukes, MD

## 2024-03-14 NOTE — Patient Instructions (Addendum)
 Please stop by lab before you go If you have mychart- we will send your results within 3 business days of us  receiving them.  If you do not have mychart- we will call you about results within 5 business days of us  receiving them.  *please also note that you will see labs on mychart as soon as they post. I will later go in and write notes on them- will say notes from Dr. Katrinka   If you develop chest pain, shortness of breath, dizziness, worsening fatigue, increased amount or frequency of bleeding please seek care- 911 on Emergency Department over weekend  Depending on drop in hemoglobin- we may need labs early next week  Stay off aspirin and ibuprofen- great job stopping aspirin already  I like colace/docusate to soften stool  1 capful of miralax daily until softer stool- hold for diarrhea  Big thing- increase water intake  [facility] contact- we may change this to STAT IF big drop in hgb Please call to schedule visit and/or procedure IF you do not hear within a week Address: [address] [place], [place], [place] [address] Phone: [phone]   Recommended follow up: Return in about 6 months (around 09/11/2024) for followup or sooner if needed.Schedule b4 you leave. Other than for acute concerns

## 2024-03-17 ENCOUNTER — Other Ambulatory Visit: Payer: Self-pay | Admitting: Family Medicine

## 2024-03-17 LAB — HEMOGLOBIN A1C: Hgb A1c MFr Bld: 7.6 % — ABNORMAL HIGH (ref 4.6–6.5)

## 2024-03-17 MED ORDER — METFORMIN HCL 500 MG PO TABS: 500.0000 mg | ORAL_TABLET | Freq: Every day | ORAL | 3 refills | Status: AC

## 2024-04-10 NOTE — Progress Notes (Unsigned)
 Ellouise Console, PA-C [address] [place], [place]  [address] Phone: [phone]   Primary Care Physician: Katrinka Garnette KIDD, MD  Primary Gastroenterologist:  Ellouise Console, PA-C / Glendia Holt, MD   Chief Complaint: Rectal bleeding       HPI:   Discussed the use of AI scribe software for clinical note transcription with the patient, who gave verbal consent to proceed.  73 year old male is referred to evaluate rectal bleeding.  He has medical history of hypertension, OSA, GERD, diabetes, hyperlipidemia, obesity.  Takes 81 mg aspirin daily.  No other blood thinners.  Takes Prilosec 20 mg daily for GERD.  06/2021 last colonoscopy by Dr. Aneita (to evaluate iron deficiency anemia): 1 small 6 mm tubular adenoma polyp removed.  Mild to moderate diverticulosis with no evidence of diverticular bleeding.  Moderate grade 1 internal hemorrhoids.  Otherwise normal.  Good prep.  7-year repeat will be due 06/2028, pending health status.  06/2021 EGD by Dr. Aneita (for iron deficiency anemia): Medium hiatal hernia.  Mild gastritis.  Normal esophagus and duodenum.  Biopsies negative for celiac and H. pylori.  03/14/2024 labs showed normal hemoglobin 13.6.  No anemia.  History of Present Illness DORTHY HUSTEAD is a 73 year old male with diverticulosis and moderate internal hemorrhoids who presents with rectal bleeding.  Rectal bleeding - Experienced a streak of bright red blood in stool on March 10, 2024, with recurrence on November 13 and 14, 2025.  He had bright red blood in the toilet only after bowel movements for 4 days. -No rectal pain. - No further bleeding since March 14, 2024. - Bleeding was alarming to him. - No pain associated with bowel movements during bleeding episodes. - Stopped taking aspirin when bleeding started. - Hemoglobin level was 13.6 on recent blood work.  Constipation - Constipated with stiff stool two days prior to bleeding episodes. - Uses Colace, a  stool softener with a laxative, to prevent constipation.  Hemorrhoids - History of moderate internal hemorrhoids. - Applies witch hazel to external hemorrhoids; usually stings, but did not burn during recent episodes, suggesting internal involvement. - History of external hemorrhoid bursting approximately six months to one year ago, resulting in significant bleeding.    Current Outpatient Medications  Medication Sig Dispense Refill   hydrocortisone (ANUSOL-HC) 25 MG suppository Place 1 suppository (25 mg total) rectally at bedtime for 24 days. 12 suppository 1   latanoprost (XALATAN) 0.005 % ophthalmic solution Place 1 drop into both eyes at bedtime.     losartan  (COZAAR ) 100 MG tablet TAKE 1 TABLET BY MOUTH DAILY 90 tablet 3   metFORMIN  (GLUCOPHAGE ) 500 MG tablet Take 1 tablet (500 mg total) by mouth daily. 90 tablet 3   omeprazole  (PRILOSEC) 20 MG capsule TAKE 1 CAPSULE BY MOUTH DAILY 90 capsule 3   rosuvastatin  (CRESTOR ) 5 MG tablet Take 1 tablet (5 mg total) by mouth once a week. 13 tablet 3   Sildenafil  Citrate (VIAGRA  PO) Take by mouth. As needed     traZODone  (DESYREL ) 50 MG tablet TAKE ONE HALF TO ONE TABLET BY MOUTH EVERY NIGHT AT BEDTIME AS NEEDED FOR SLEEP 30 tablet 3   No current facility-administered medications for this visit.    Allergies as of 04/11/2024   (No Known Allergies)    Past Medical History:  Diagnosis Date   BCC (basal cell carcinoma of skin)    2008 or 2009 left shoulder   ED (erectile dysfunction)  Elevated liver function tests    2020 labs normal- issues in past    Family history of bladder cancer    Family history of prostate cancer    GERD (gastroesophageal reflux disease)    Hx of colonic polyp    Hypercholesteremia    Hypertension     Past Surgical History:  Procedure Laterality Date   WRIST GANGLION EXCISION     he reports several cysts in past    Review of Systems:    All systems reviewed and negative except where noted in  HPI.    Physical Exam:  BP 136/80   Pulse 91   Ht 5' 11 (1.803 m)   Wt 258 lb 4 oz (117.1 kg)   BMI 36.02 kg/m  No LMP for male patient.  General: Well-nourished, well-developed in no acute distress.  Lungs: Clear to auscultation bilaterally. Non-labored. Heart: Regular rate and rhythm, no murmurs rubs or gallops.  Abdomen: Bowel sounds are normal; Abdomen is Soft; No hepatosplenomegaly, masses or hernias;  No Abdominal Tenderness; No guarding or rebound tenderness. Neuro: Alert and oriented x 3.  Grossly intact.  Psych: Alert and cooperative, normal mood and affect. Rectal exam: 1 small external hemorrhoid skin tag which is not swollen and nonthrombosed.  No anal fissures.  Normal anal sphincter tone.  No tenderness or masses.  Stool is soft, brown and Hemoccult negative.   ANOSCOPY PROCEDURE REPORT:  INDICATION: Rectal bleeding  The nature of anoscopy procedure and rationale for use was explained to the patient and they understood and agreed to proceed.  The lubricated lighted anoscope was inserted with the patient in the left lateral decubitus position and the anus and distal rectum was examined.  FINDINGS: Large internal hemorrhoids in the right side of rectum extending to the anal verge.  Not bleeding.  No rectal masses.  Anal mucosa otherwise normal.   Imaging Studies: No results found.  Labs: CBC    Component Value Date/Time   WBC 5.9 03/14/2024 1217   RBC 5.23 03/14/2024 1217   HGB 13.6 03/14/2024 1217   HCT 41.2 03/14/2024 1217   PLT 180.0 03/14/2024 1217   MCV 78.8 03/14/2024 1217   MCH 29.8 12/23/2020 1042   MCHC 33.0 03/14/2024 1217   RDW 16.1 (H) 03/14/2024 1217   LYMPHSABS 2.2 03/14/2024 1217   MONOABS 0.7 03/14/2024 1217   EOSABS 0.2 03/14/2024 1217   BASOSABS 0.1 03/14/2024 1217    CMP     Component Value Date/Time   NA 132 (L) 03/14/2024 1217   K 4.1 03/14/2024 1217   CL 97 03/14/2024 1217   CO2 26 03/14/2024 1217   GLUCOSE 108 (H)  03/14/2024 1217   BUN 11 03/14/2024 1217   CREATININE 1.20 03/14/2024 1217   CREATININE 1.24 11/26/2019 0825   CALCIUM  9.9 03/14/2024 1217   PROT 8.1 03/14/2024 1217   ALBUMIN 4.8 03/14/2024 1217   AST 31 03/14/2024 1217   ALT 36 03/14/2024 1217   ALKPHOS 65 03/14/2024 1217   BILITOT 0.9 03/14/2024 1217   GFRNONAA >60 12/23/2020 1042   GFRAA 81 05/04/2008 0919     Assessment and Plan:   JAVONE YBANEZ is a 73 y.o. y/o male presents for:  1.  Rectal bleeding: Currently Resolved.  Most likely internal hemorrhoid bleeding.  Normal hemoglobin 16G.  No anemia.  Last colonoscopy 06/2021 showed 1 small tubular adenoma polyp removed and 7-year repeat colonoscopy was recommended.  Incidental medium internal hemorrhoids and diverticulosis on last colonoscopy.  Current symptoms do NOT suggest diverticular bleed, ischemic colitis, or colon cancer.  Large internal hemorrhoids on exam today.  Negative Hemoccult test. - Repeat colonoscopy is not recommended at this time, as it would most likely be low yield. - Giving treatment for internal hemorrhoids with follow-up. - If he has persistent bleeding in spite of treatment of internal hemorrhoids, then we can consider repeat colonoscopy.  2.  Moderate internal hemorrhoids - Rx Hydrocortisone Suppositories 25mg  Insert 1 into rectum once daily at bedtime for 10-14 days. - Stressed importance of treating underlying constipation. - Avoid Sitting on the toilet for prolonged amount of time. - Discussed Internal Hemorrhoid Banding if no improvement with conservative treament.   3.  Mild Constipation - Colace Stool Softener Generic: Docusate Sodium - 100mg  OR 50mg  Take 1 Capsule Once Daily for Constipation  - Recommend High Fiber diet with fruits, vegetables, and whole grains. - Drink 64 ounces of Fluids Daily.    Ellouise Console, PA-C  Follow up in 6 weeks with TG.  Sooner if recurrent or worsening symptoms.

## 2024-04-11 ENCOUNTER — Encounter: Payer: Self-pay | Admitting: Physician Assistant

## 2024-04-11 ENCOUNTER — Ambulatory Visit: Admitting: Physician Assistant

## 2024-04-11 VITALS — BP 136/80 | HR 91 | Ht 71.0 in | Wt 258.2 lb

## 2024-04-11 DIAGNOSIS — Z8719 Personal history of other diseases of the digestive system: Secondary | ICD-10-CM | POA: Diagnosis not present

## 2024-04-11 DIAGNOSIS — K648 Other hemorrhoids: Secondary | ICD-10-CM

## 2024-04-11 DIAGNOSIS — Z860101 Personal history of adenomatous and serrated colon polyps: Secondary | ICD-10-CM

## 2024-04-11 DIAGNOSIS — K5900 Constipation, unspecified: Secondary | ICD-10-CM | POA: Diagnosis not present

## 2024-04-11 DIAGNOSIS — K649 Unspecified hemorrhoids: Secondary | ICD-10-CM

## 2024-04-11 MED ORDER — HYDROCORTISONE ACETATE 25 MG RE SUPP: 25.0000 mg | Freq: Every day | RECTAL | 1 refills | Status: AC

## 2024-04-11 NOTE — Patient Instructions (Addendum)
°  Colace Stool Softener Generic: Docusate Sodium - 100mg  OR 50mg  Take 1 Capsule Once Daily for Constipation  We have sent the following medications to your pharmacy for you to pick up at your convenience: Hydrocortisone Suppositories 25 mg at bedtime for 24 days. _______________________________________________________ Please follow up sooner if symptoms increase or worsen  Due to recent changes in healthcare laws, you may see the results of your imaging and laboratory studies on MyChart before your provider has had a chance to review them.  We understand that in some cases there may be results that are confusing or concerning to you. Not all laboratory results come back in the same time frame and the provider may be waiting for multiple results in order to interpret others.  Please give us  48 hours in order for your provider to thoroughly review all the results before contacting the office for clarification of your results.   Thank you for trusting me with your gastrointestinal care!   Ellouise Console, PA-C _______________________________________________________  If your blood pressure at your visit was 140/90 or greater, please contact your primary care physician to follow up on this. _______________________________________________________  If you are age 71 or older, your body mass index should be between 23-30. Your Body mass index is 36.02 kg/m. If this is out of the aforementioned range listed, please consider follow up with your Primary Care Provider.  If you are age 71 or younger, your body mass index should be between 19-25. Your Body mass index is 36.02 kg/m. If this is out of the aformentioned range listed, please consider follow up with your Primary Care Provider.  ________________________________________________________  The [facility] providers would like to encourage you to use MYCHART to communicate with providers for non-urgent requests or questions.  Due to long hold times on  the telephone, sending your provider a message by [facility] may be a faster and more efficient way to get a response.  Please allow 48 business hours for a response.  Please remember that this is for non-urgent requests.  _______________________________________________________

## 2024-04-20 NOTE — Progress Notes (Signed)
 Agree with the assessment and plan as outlined by Brigitte Canard, PA-C.

## 2024-05-26 NOTE — Progress Notes (Unsigned)
 "     Ellouise Console, PA-C [address] [place], [place]  [address] Phone: [phone]   Primary Care Physician: Katrinka Garnette KIDD, MD  Primary Gastroenterologist:  Ellouise Console, PA-C / ***  Chief Complaint: Follow-up hemorrhoids, constipation, rectal bleeding       HPI:   Discussed the use of AI scribe software for clinical note transcription with the patient, who gave verbal consent to proceed.  Patient returns for 6-week follow-up internal hemorrhoids, rectal bleeding, constipation.  I last saw patient 04/11/2024.  Treated with Colace stool softener, hydrocortisone  suppositories.  Discussed internal hemorrhoid banding if no improvement.  History of Present Illness    06/2021 last colonoscopy by Dr. Aneita (to evaluate iron deficiency anemia): 1 small 6 mm tubular adenoma polyp removed.  Mild to moderate diverticulosis with no evidence of diverticular bleeding.  Moderate grade 1 internal hemorrhoids.  Otherwise normal.  Good prep.  7-year repeat will be due 06/2028, pending health status.   06/2021 EGD by Dr. Aneita (for iron deficiency anemia): Medium hiatal hernia.  Mild gastritis.  Normal esophagus and duodenum.  Biopsies negative for celiac and H. pylori.   03/14/2024 labs showed normal hemoglobin 13.6.  No anemia.  PMH: hypertension, OSA, GERD, diabetes, hyperlipidemia, obesity.   Current Outpatient Medications  Medication Sig Dispense Refill   latanoprost (XALATAN) 0.005 % ophthalmic solution Place 1 drop into both eyes at bedtime.     losartan  (COZAAR ) 100 MG tablet TAKE 1 TABLET BY MOUTH DAILY 90 tablet 3   metFORMIN  (GLUCOPHAGE ) 500 MG tablet Take 1 tablet (500 mg total) by mouth daily. 90 tablet 3   omeprazole  (PRILOSEC) 20 MG capsule TAKE 1 CAPSULE BY MOUTH DAILY 90 capsule 3   rosuvastatin  (CRESTOR ) 5 MG tablet Take 1 tablet (5 mg total) by mouth once a week. 13 tablet 3   Sildenafil  Citrate (VIAGRA  PO) Take by mouth. As needed     traZODone  (DESYREL ) 50 MG tablet  TAKE ONE HALF TO ONE TABLET BY MOUTH EVERY NIGHT AT BEDTIME AS NEEDED FOR SLEEP 30 tablet 3   No current facility-administered medications for this visit.    Allergies as of 05/27/2024   (No Known Allergies)    Past Medical History:  Diagnosis Date   BCC (basal cell carcinoma of skin)    2008 or 2009 left shoulder   ED (erectile dysfunction)    Elevated liver function tests    2020 labs normal- issues in past    Family history of bladder cancer    Family history of prostate cancer    GERD (gastroesophageal reflux disease)    Hx of colonic polyp    Hypercholesteremia    Hypertension     Past Surgical History:  Procedure Laterality Date   WRIST GANGLION EXCISION     he reports several cysts in past    Review of Systems:    All systems reviewed and negative except where noted in HPI.    Physical Exam:  There were no vitals taken for this visit. No LMP for male patient.  General: Well-nourished, well-developed in no acute distress.  Lungs: Clear to auscultation bilaterally. Non-labored. Heart: Regular rate and rhythm, no murmurs rubs or gallops.  Abdomen: Bowel sounds are normal; Abdomen is Soft; No hepatosplenomegaly, masses or hernias;  No Abdominal Tenderness; No guarding or rebound tenderness. Neuro: Alert and oriented x 3.  Grossly intact.  Psych: Alert and cooperative, normal mood and affect.   Imaging Studies: No results found.  Labs:  CBC    Component Value Date/Time   WBC 5.9 03/14/2024 1217   RBC 5.23 03/14/2024 1217   HGB 13.6 03/14/2024 1217   HCT 41.2 03/14/2024 1217   PLT 180.0 03/14/2024 1217   MCV 78.8 03/14/2024 1217   MCH 29.8 12/23/2020 1042   MCHC 33.0 03/14/2024 1217   RDW 16.1 (H) 03/14/2024 1217   LYMPHSABS 2.2 03/14/2024 1217   MONOABS 0.7 03/14/2024 1217   EOSABS 0.2 03/14/2024 1217   BASOSABS 0.1 03/14/2024 1217    CMP     Component Value Date/Time   NA 132 (L) 03/14/2024 1217   K 4.1 03/14/2024 1217   CL 97 03/14/2024 1217    CO2 26 03/14/2024 1217   GLUCOSE 108 (H) 03/14/2024 1217   BUN 11 03/14/2024 1217   CREATININE 1.20 03/14/2024 1217   CREATININE 1.24 11/26/2019 0825   CALCIUM  9.9 03/14/2024 1217   PROT 8.1 03/14/2024 1217   ALBUMIN 4.8 03/14/2024 1217   AST 31 03/14/2024 1217   ALT 36 03/14/2024 1217   ALKPHOS 65 03/14/2024 1217   BILITOT 0.9 03/14/2024 1217   GFRNONAA >60 12/23/2020 1042   GFRAA 81 05/04/2008 0919       Assessment and Plan:   ZEALAND BOYETT is a 74 y.o. y/o male returns for follow-up of  1.  Bleeding internal hemorrhoids - Consider internal hemorrhoid banding if no improvement on conservative treatment  2.  Mild constipation - Continue stool softener - Fiber supplement  3.  Small tubular adenoma colon polyp with last colonoscopy 06/2021 - 7-year repeat colonoscopy will be due 10/2028 - Consider sooner diagnostic colonoscopy if rectal bleeding worsens or persist  Assessment and Plan Assessment & Plan       Ellouise Console, PA-C  Follow up ***   "

## 2024-05-27 ENCOUNTER — Ambulatory Visit: Admitting: Physician Assistant

## 2025-01-26 ENCOUNTER — Ambulatory Visit
